# Patient Record
Sex: Female | Born: 1935 | Race: White | Hispanic: No | Smoking: Former smoker
Health system: Southern US, Community
[De-identification: ages and names within clinical notes are randomized; demographics above are authoritative.]

## PROBLEM LIST (undated history)

## (undated) DIAGNOSIS — T8859XA Other complications of anesthesia, initial encounter: Secondary | ICD-10-CM

## (undated) DIAGNOSIS — R5382 Chronic fatigue, unspecified: Secondary | ICD-10-CM

## (undated) DIAGNOSIS — A938 Other specified arthropod-borne viral fevers: Secondary | ICD-10-CM

## (undated) DIAGNOSIS — Z8489 Family history of other specified conditions: Secondary | ICD-10-CM

## (undated) DIAGNOSIS — Z9889 Other specified postprocedural states: Secondary | ICD-10-CM

## (undated) DIAGNOSIS — F329 Major depressive disorder, single episode, unspecified: Secondary | ICD-10-CM

## (undated) DIAGNOSIS — R002 Palpitations: Secondary | ICD-10-CM

## (undated) DIAGNOSIS — F1020 Alcohol dependence, uncomplicated: Secondary | ICD-10-CM

## (undated) DIAGNOSIS — F32A Depression, unspecified: Secondary | ICD-10-CM

## (undated) DIAGNOSIS — H8109 Meniere's disease, unspecified ear: Secondary | ICD-10-CM

## (undated) DIAGNOSIS — R112 Nausea with vomiting, unspecified: Secondary | ICD-10-CM

## (undated) DIAGNOSIS — M797 Fibromyalgia: Secondary | ICD-10-CM

## (undated) DIAGNOSIS — G9332 Myalgic encephalomyelitis/chronic fatigue syndrome: Secondary | ICD-10-CM

## (undated) DIAGNOSIS — R269 Unspecified abnormalities of gait and mobility: Secondary | ICD-10-CM

## (undated) DIAGNOSIS — I10 Essential (primary) hypertension: Secondary | ICD-10-CM

## (undated) DIAGNOSIS — F1920 Other psychoactive substance dependence, uncomplicated: Secondary | ICD-10-CM

## (undated) DIAGNOSIS — R42 Dizziness and giddiness: Principal | ICD-10-CM

## (undated) DIAGNOSIS — E785 Hyperlipidemia, unspecified: Secondary | ICD-10-CM

## (undated) DIAGNOSIS — F4310 Post-traumatic stress disorder, unspecified: Secondary | ICD-10-CM

## (undated) DIAGNOSIS — F419 Anxiety disorder, unspecified: Secondary | ICD-10-CM

## (undated) DIAGNOSIS — T4145XA Adverse effect of unspecified anesthetic, initial encounter: Secondary | ICD-10-CM

## (undated) DIAGNOSIS — H6690 Otitis media, unspecified, unspecified ear: Secondary | ICD-10-CM

## (undated) DIAGNOSIS — K219 Gastro-esophageal reflux disease without esophagitis: Secondary | ICD-10-CM

## (undated) DIAGNOSIS — R569 Unspecified convulsions: Secondary | ICD-10-CM

## (undated) DIAGNOSIS — C50919 Malignant neoplasm of unspecified site of unspecified female breast: Secondary | ICD-10-CM

## (undated) HISTORY — PX: TONSILLECTOMY: SUR1361

## (undated) HISTORY — DX: Hyperlipidemia, unspecified: E78.5

## (undated) HISTORY — PX: DILATION AND CURETTAGE OF UTERUS: SHX78

## (undated) HISTORY — DX: Post-traumatic stress disorder, unspecified: F43.10

## (undated) HISTORY — DX: Dizziness and giddiness: R42

## (undated) HISTORY — DX: Anxiety disorder, unspecified: F41.9

## (undated) HISTORY — DX: Unspecified abnormalities of gait and mobility: R26.9

---

## 1969-10-20 DIAGNOSIS — F1920 Other psychoactive substance dependence, uncomplicated: Secondary | ICD-10-CM

## 1969-10-20 HISTORY — DX: Other psychoactive substance dependence, uncomplicated: F19.20

## 2000-12-28 ENCOUNTER — Encounter: Admission: RE | Admit: 2000-12-28 | Discharge: 2000-12-28 | Payer: Self-pay | Admitting: Cardiovascular Disease

## 2000-12-28 ENCOUNTER — Encounter: Payer: Self-pay | Admitting: Cardiovascular Disease

## 2002-03-06 ENCOUNTER — Emergency Department (HOSPITAL_COMMUNITY): Admission: EM | Admit: 2002-03-06 | Discharge: 2002-03-06 | Payer: Self-pay

## 2002-03-22 ENCOUNTER — Emergency Department (HOSPITAL_COMMUNITY): Admission: EM | Admit: 2002-03-22 | Discharge: 2002-03-22 | Payer: Self-pay | Admitting: Emergency Medicine

## 2002-03-22 ENCOUNTER — Encounter: Payer: Self-pay | Admitting: Emergency Medicine

## 2003-05-19 ENCOUNTER — Emergency Department (HOSPITAL_COMMUNITY): Admission: EM | Admit: 2003-05-19 | Discharge: 2003-05-19 | Payer: Self-pay | Admitting: Emergency Medicine

## 2003-10-16 ENCOUNTER — Encounter: Admission: RE | Admit: 2003-10-16 | Discharge: 2003-10-16 | Payer: Self-pay | Admitting: Cardiovascular Disease

## 2004-07-15 ENCOUNTER — Encounter: Admission: RE | Admit: 2004-07-15 | Discharge: 2004-07-15 | Payer: Self-pay | Admitting: Cardiovascular Disease

## 2004-10-08 ENCOUNTER — Ambulatory Visit (HOSPITAL_COMMUNITY): Admission: RE | Admit: 2004-10-08 | Discharge: 2004-10-08 | Payer: Self-pay | Admitting: Cardiovascular Disease

## 2005-03-03 ENCOUNTER — Emergency Department (HOSPITAL_COMMUNITY): Admission: EM | Admit: 2005-03-03 | Discharge: 2005-03-03 | Payer: Self-pay | Admitting: Family Medicine

## 2006-06-23 ENCOUNTER — Emergency Department (HOSPITAL_COMMUNITY): Admission: EM | Admit: 2006-06-23 | Discharge: 2006-06-23 | Payer: Self-pay | Admitting: Family Medicine

## 2007-06-22 ENCOUNTER — Encounter: Admission: RE | Admit: 2007-06-22 | Discharge: 2007-06-22 | Payer: Self-pay | Admitting: Cardiovascular Disease

## 2007-07-20 ENCOUNTER — Emergency Department (HOSPITAL_COMMUNITY): Admission: EM | Admit: 2007-07-20 | Discharge: 2007-07-20 | Payer: Self-pay | Admitting: Emergency Medicine

## 2010-03-27 ENCOUNTER — Encounter: Admission: RE | Admit: 2010-03-27 | Discharge: 2010-03-27 | Payer: Self-pay | Admitting: Orthopedic Surgery

## 2010-11-09 ENCOUNTER — Encounter: Payer: Self-pay | Admitting: Cardiovascular Disease

## 2012-10-10 ENCOUNTER — Encounter (HOSPITAL_COMMUNITY): Payer: Self-pay | Admitting: Emergency Medicine

## 2012-10-10 ENCOUNTER — Emergency Department (HOSPITAL_COMMUNITY)
Admission: EM | Admit: 2012-10-10 | Discharge: 2012-10-10 | Disposition: A | Discharge status: Home / Self Care | Payer: Medicare Other | Attending: Emergency Medicine | Admitting: Emergency Medicine

## 2012-10-10 DIAGNOSIS — Z79899 Other long term (current) drug therapy: Secondary | ICD-10-CM | POA: Insufficient documentation

## 2012-10-10 DIAGNOSIS — K219 Gastro-esophageal reflux disease without esophagitis: Secondary | ICD-10-CM | POA: Insufficient documentation

## 2012-10-10 DIAGNOSIS — R42 Dizziness and giddiness: Secondary | ICD-10-CM

## 2012-10-10 DIAGNOSIS — R110 Nausea: Secondary | ICD-10-CM | POA: Insufficient documentation

## 2012-10-10 DIAGNOSIS — I10 Essential (primary) hypertension: Secondary | ICD-10-CM | POA: Insufficient documentation

## 2012-10-10 DIAGNOSIS — Z8669 Personal history of other diseases of the nervous system and sense organs: Secondary | ICD-10-CM | POA: Insufficient documentation

## 2012-10-10 DIAGNOSIS — Z87891 Personal history of nicotine dependence: Secondary | ICD-10-CM | POA: Insufficient documentation

## 2012-10-10 HISTORY — DX: Essential (primary) hypertension: I10

## 2012-10-10 HISTORY — DX: Gastro-esophageal reflux disease without esophagitis: K21.9

## 2012-10-10 HISTORY — DX: Otitis media, unspecified, unspecified ear: H66.90

## 2012-10-10 MED ORDER — LORAZEPAM 1 MG PO TABS
1.0000 mg | ORAL_TABLET | Freq: Once | ORAL | Status: AC
  Administered 2012-10-10: 1 mg via ORAL
  Filled 2012-10-10: qty 1

## 2012-10-10 MED ORDER — MECLIZINE HCL 25 MG PO TABS
25.0000 mg | ORAL_TABLET | Freq: Once | ORAL | Status: AC
  Administered 2012-10-10: 25 mg via ORAL
  Filled 2012-10-10: qty 1

## 2012-10-10 MED ORDER — MECLIZINE HCL 50 MG PO TABS: 25.0000 mg | ORAL_TABLET | Freq: Four times a day (QID) | ORAL | Status: DC | PRN

## 2012-10-10 NOTE — ED Notes (Signed)
[facility] called for transport.  

## 2012-10-10 NOTE — ED Notes (Addendum)
Pt here via EMS for c/o dizziness ( Vertigo) EMS stated she was up walking  around when they arrivedThe patient stated that she has had an inner ear  infection.She call her PCP and they told her to come here

## 2012-10-10 NOTE — ED Notes (Signed)
XBM:WU13<KG> Expected date:10/10/12<BR> Expected time: 9:18 AM<BR> Means of arrival:<BR> Comments:<BR> Vertigo

## 2012-10-10 NOTE — ED Provider Notes (Signed)
History    77 year old female with vertigo. Patient describes a sensation that things are moving around her. Worse when she walks or moves. Feels better at rest. Nauseated. No ear pain, sensation of ear fullness or tenderness. No numbness, tingling or loss of strength. No headaches. No visual complaints. Denies trauma.   CSN: [id number]  Arrival date & time 10/10/12  4098   First MD Initiated Contact with Patient 10/10/12 0940      Chief Complaint  Patient presents with  . Dizziness    (Consider location/radiation/quality/duration/timing/severity/associated sxs/prior treatment) HPI  Past Medical History  Diagnosis Date  . Hypertension   . Middle ear infection   . GERD (gastroesophageal reflux disease)     No past surgical history on file.  No family history on file.  History  Substance Use Topics  . Smoking status: Former Smoker    Quit date: 06/10/1974  . Smokeless tobacco: Not on file  . Alcohol Use: No    OB History    Grav Para Term Preterm Abortions TAB SAB Ect Mult Living                  Review of Systems  All systems reviewed and negative, other than as noted in HPI.   Allergies  Review of patient's allergies indicates no known allergies.  Home Medications   Current Outpatient Rx  Name  Route  Sig  Dispense  Refill  . AMLODIPINE BESYLATE 5 MG PO TABS   Oral   Take 2.5 mg by mouth daily.         Marland Kitchen ESOMEPRAZOLE MAGNESIUM 40 MG PO CPDR   Oral   Take 40 mg by mouth daily as needed. For indigestion.         Marland Kitchen METOPROLOL SUCCINATE ER 25 MG PO TB24   Oral   Take 12.5 mg by mouth daily.         . OXYCODONE HCL 5 MG PO TABS   Oral   Take 5 mg by mouth daily as needed. For pain/nerves.         Marland Kitchen TEMAZEPAM 15 MG PO CAPS   Oral   Take 7.5 mg by mouth at bedtime as needed. For sleep.         Marland Kitchen MECLIZINE HCL 50 MG PO TABS   Oral   Take 0.5 tablets (25 mg total) by mouth every 6 (six) hours as needed.   30 tablet   0     BP  138/77  Pulse 87  Temp 98.9 F (37.2 C)  Resp 18  Ht 5\' 1"  (1.549 m)  Wt 119 lb (53.978 kg)  BMI 22.48 kg/m2  SpO2 98%  Physical Exam  Nursing note and vitals reviewed. Constitutional: She is oriented to person, place, and time. She appears well-developed and well-nourished. No distress.  HENT:  Head: Normocephalic and atraumatic.  Right Ear: External ear normal.  Left Ear: External ear normal.       Tympanic membranes and external auditory canals are clear bilaterally.  Eyes: Conjunctivae normal are normal. Pupils are equal, round, and reactive to light. Right eye exhibits no discharge. Left eye exhibits no discharge.  Neck: Neck supple.  Cardiovascular: Normal rate, regular rhythm and normal heart sounds.  Exam reveals no gallop and no friction rub.   No murmur heard. Pulmonary/Chest: Effort normal and breath sounds normal. No respiratory distress.  Abdominal: Soft. She exhibits no distension. There is no tenderness.  Musculoskeletal: She exhibits no edema and no  tenderness.  Neurological: She is alert and oriented to person, place, and time. No cranial nerve deficit. She exhibits normal muscle tone. Coordination normal.       Good finger to nose testing bilaterally. Gait is steady.  Skin: Skin is warm and dry. She is not diaphoretic.  Psychiatric: She has a normal mood and affect. Her behavior is normal. Thought content normal.    ED Course  Procedures (including critical care time)  Labs Reviewed - No data to display No results found.   1. Vertigo       MDM  77 year old female with vertigo. Patient does not describe dizziness, lightheadedness or presyncopal symptoms. She has a nonfocal neurological examination. I suspect a peripheral cause of her symptoms. Consider central cause, but doubt. Plan symptomatic treatment at this time. Emergent return precautions were discussed. Outpatient PCP and/or ENT followup otherwise.        Raeford Razor, MD 10/10/12 1037

## 2012-10-10 NOTE — ED Notes (Signed)
Pt ambulated in hall with no assistance. Pt c/o dizziness.

## 2012-10-30 ENCOUNTER — Encounter (HOSPITAL_COMMUNITY): Payer: Self-pay | Admitting: *Deleted

## 2012-10-30 ENCOUNTER — Emergency Department (INDEPENDENT_AMBULATORY_CARE_PROVIDER_SITE_OTHER)
Admission: EM | Admit: 2012-10-30 | Discharge: 2012-10-30 | Disposition: A | Discharge status: Home / Self Care | Payer: Medicare Other | Source: Home / Self Care | Attending: Family Medicine | Admitting: Family Medicine

## 2012-10-30 DIAGNOSIS — L720 Epidermal cyst: Secondary | ICD-10-CM

## 2012-10-30 DIAGNOSIS — L723 Sebaceous cyst: Secondary | ICD-10-CM

## 2012-10-30 HISTORY — DX: Myalgic encephalomyelitis/chronic fatigue syndrome: G93.32

## 2012-10-30 HISTORY — DX: Chronic fatigue, unspecified: R53.82

## 2012-10-30 HISTORY — DX: Fibromyalgia: M79.7

## 2012-10-30 MED ORDER — MUPIROCIN CALCIUM 2 % EX CREA: TOPICAL_CREAM | Freq: Three times a day (TID) | CUTANEOUS | Status: DC

## 2012-10-30 MED ORDER — CEPHALEXIN 500 MG PO CAPS: 500.0000 mg | ORAL_CAPSULE | Freq: Two times a day (BID) | ORAL | Status: DC

## 2012-10-30 NOTE — ED Notes (Signed)
Pt reports skin abscess on chest ( dime size with redness) denies pain, tenderness - pt very excitable & dramatic at triage

## 2012-10-30 NOTE — ED Provider Notes (Signed)
History     CSN: [id number]  Arrival date & time 10/30/12  1710   First MD Initiated Contact with Patient 10/30/12 1713      Chief Complaint  Patient presents with  . Abscess    (Consider location/radiation/quality/duration/timing/severity/associated sxs/prior treatment) HPI Comments: 78 year old female with history of hypertension. Here complaining of a "red knot" in her left upper chest. Patient stated she noticed this "knot" a few days ago and has been touching it frequently and has become mildly tender. Denies spontaneous drainage. She is worried about an infection. Is otherwise feeling well.   Past Medical History  Diagnosis Date  . Hypertension   . Middle ear infection   . GERD (gastroesophageal reflux disease)   . Chronic fatigue fibromyalgia syndrome     History reviewed. No pertinent past surgical history.  Family History  Problem Relation Age of Onset  . Family history unknown: Yes    History  Substance Use Topics  . Smoking status: Former Smoker    Quit date: 06/10/1974  . Smokeless tobacco: Not on file  . Alcohol Use: No    OB History    Grav Para Term Preterm Abortions TAB SAB Ect Mult Living                  Review of Systems  Constitutional: Negative for fever and chills.  Skin:       As per HPI  Psychiatric/Behavioral: The patient is nervous/anxious.     Allergies  Review of patient's allergies indicates no known allergies.  Home Medications   Current Outpatient Rx  Name  Route  Sig  Dispense  Refill  . AMLODIPINE BESYLATE 5 MG PO TABS   Oral   Take 2.5 mg by mouth daily.         . CEPHALEXIN 500 MG PO CAPS   Oral   Take 1 capsule (500 mg total) by mouth 2 (two) times daily.   10 capsule   0   . ESOMEPRAZOLE MAGNESIUM 40 MG PO CPDR   Oral   Take 40 mg by mouth daily as needed. For indigestion.         Marland Kitchen MECLIZINE HCL 50 MG PO TABS   Oral   Take 0.5 tablets (25 mg total) by mouth every 6 (six) hours as needed.   30  tablet   0   . METOPROLOL SUCCINATE ER 25 MG PO TB24   Oral   Take 12.5 mg by mouth daily.         Marland Kitchen MUPIROCIN CALCIUM 2 % EX CREA   Topical   Apply topically 3 (three) times daily.   15 g   0   . OXYCODONE HCL 5 MG PO TABS   Oral   Take 5 mg by mouth daily as needed. For pain/nerves.         Marland Kitchen TEMAZEPAM 15 MG PO CAPS   Oral   Take 7.5 mg by mouth at bedtime as needed. For sleep.           BP 176/75  Pulse 96  Temp 98.7 F (37.1 C) (Oral)  Resp 18  SpO2 98%  Physical Exam  Nursing note and vitals reviewed. Constitutional: She is oriented to person, place, and time. She appears well-developed and well-nourished. No distress.  Cardiovascular: Normal heart sounds.   Pulmonary/Chest: Breath sounds normal.  Neurological: She is alert and oriented to person, place, and time.  Skin:       Small 1.5 cm  epidermal cyst with mild erythema on top.located in left upper chest. No fluctuations. No swelling or spontaneous drainage.    ED Course  INCISION AND DRAINAGE Performed by: Sharin Grave Authorized by: Sharin Grave Consent: Verbal consent obtained. Risks and benefits: risks, benefits and alternatives were discussed Consent given by: patient Patient understanding: patient states understanding of the procedure being performed Patient consent: the patient's understanding of the procedure matches consent given Type: cyst Body area: trunk Location details: chest Anesthesia: local infiltration Local anesthetic: lidocaine 1% without epinephrine Anesthetic total: 1 ml Needle gauge: 18 Complexity: simple Drainage: serous Drainage amount: scant Patient tolerance: Patient tolerated the procedure well with no immediate complications. Comments: Patient tolerated procedure well. No purulent drainage. No samples were sent for culture. A sterile dressing applied on top.   (including critical care time)  Labs Reviewed - No data to display No results  found.   1. Epidermal cyst       MDM  Irritated epidermal cyst. As per patient request I I&D it today. Only small amount of sebaceous material expressed. Wound care instructions discussed with patient and provided in writing. Prescribe Keflex and mupirocin. Supportive care and red flags that should prompt his return to medical attention discussed with patient and provided in writing.  Sharin Grave, MD 11/01/12 [phone]

## 2013-10-27 ENCOUNTER — Emergency Department (HOSPITAL_COMMUNITY)
Admission: EM | Admit: 2013-10-27 | Discharge: 2013-10-28 | Disposition: A | Discharge status: Home / Self Care | Payer: Medicare Other | Attending: Emergency Medicine | Admitting: Emergency Medicine

## 2013-10-27 ENCOUNTER — Encounter (HOSPITAL_COMMUNITY): Payer: Self-pay | Admitting: Emergency Medicine

## 2013-10-27 DIAGNOSIS — N950 Postmenopausal bleeding: Secondary | ICD-10-CM

## 2013-10-27 DIAGNOSIS — Z79899 Other long term (current) drug therapy: Secondary | ICD-10-CM | POA: Insufficient documentation

## 2013-10-27 DIAGNOSIS — Z792 Long term (current) use of antibiotics: Secondary | ICD-10-CM | POA: Insufficient documentation

## 2013-10-27 DIAGNOSIS — F1021 Alcohol dependence, in remission: Secondary | ICD-10-CM | POA: Insufficient documentation

## 2013-10-27 DIAGNOSIS — K219 Gastro-esophageal reflux disease without esophagitis: Secondary | ICD-10-CM | POA: Insufficient documentation

## 2013-10-27 DIAGNOSIS — N952 Postmenopausal atrophic vaginitis: Secondary | ICD-10-CM | POA: Insufficient documentation

## 2013-10-27 DIAGNOSIS — I10 Essential (primary) hypertension: Secondary | ICD-10-CM | POA: Insufficient documentation

## 2013-10-27 DIAGNOSIS — Z87891 Personal history of nicotine dependence: Secondary | ICD-10-CM | POA: Insufficient documentation

## 2013-10-27 DIAGNOSIS — Z8669 Personal history of other diseases of the nervous system and sense organs: Secondary | ICD-10-CM | POA: Insufficient documentation

## 2013-10-27 HISTORY — DX: Alcohol dependence, uncomplicated: F10.20

## 2013-10-27 NOTE — ED Provider Notes (Signed)
CSN: [id number]     Arrival date & time 10/27/13  2129 History   First MD Initiated Contact with Patient 10/27/13 2332     Chief Complaint  Patient presents with  . Vaginal Bleeding   (Consider location/radiation/quality/duration/timing/severity/associated sxs/prior Treatment) Patient is a 79 y.o. female presenting with vaginal bleeding. The history is provided by the patient.  Vaginal Bleeding She had intercourse this evening and following that, noted some pinkish drainage from the vagina. There is no pain. Over the next hour, she had 2 episodes of moderate bleeding from the vagina. She states that she has noted some pinkish drainage on one or 2 other occasions following intercourse. Intercourse only occurs once every one to 2 months but this month she has had a dull little bit more frequently. She denies any vaginal pain or abdominal pain.  Past Medical History  Diagnosis Date  . Hypertension   . Middle ear infection   . GERD (gastroesophageal reflux disease)   . Chronic fatigue fibromyalgia syndrome   . Alcoholism    Past Surgical History  Procedure Laterality Date  . Tonsillectomy    . Dilatation and curettage     History reviewed. No pertinent family history. History  Substance Use Topics  . Smoking status: Former Smoker    Quit date: 06/10/1974  . Smokeless tobacco: Not on file  . Alcohol Use: No   OB History   Grav Para Term Preterm Abortions TAB SAB Ect Mult Living                 Review of Systems  Genitourinary: Positive for vaginal bleeding.  All other systems reviewed and are negative.    Allergies  Review of patient's allergies indicates no known allergies.  Home Medications   Current Outpatient Rx  Name  Route  Sig  Dispense  Refill  . amLODipine (NORVASC) 5 MG tablet   Oral   Take 2.5 mg by mouth daily.         . cephALEXin (KEFLEX) 500 MG capsule   Oral   Take 1 capsule (500 mg total) by mouth 2 (two) times daily.   10 capsule   0   .  esomeprazole (NEXIUM) 40 MG capsule   Oral   Take 40 mg by mouth daily as needed. For indigestion.         . meclizine (ANTIVERT) 50 MG tablet   Oral   Take 0.5 tablets (25 mg total) by mouth every 6 (six) hours as needed.   30 tablet   0   . metoprolol succinate (TOPROL-XL) 25 MG 24 hr tablet   Oral   Take 12.5 mg by mouth daily.         . mupirocin cream (BACTROBAN) 2 %   Topical   Apply topically 3 (three) times daily.   15 g   0   . oxyCODONE (OXY IR/ROXICODONE) 5 MG immediate release tablet   Oral   Take 5 mg by mouth daily as needed. For pain/nerves.         . temazepam (RESTORIL) 15 MG capsule   Oral   Take 7.5 mg by mouth at bedtime as needed. For sleep.          BP 182/99  Pulse 97  Temp(Src) 97.8 F (36.6 C) (Oral)  Resp 20  SpO2 98% Physical Exam  Nursing note and vitals reviewed.  79 year old female, resting comfortably and in no acute distress. Vital signs are significant for hypertension with  blood pressure 100/80 to 99. Oxygen saturation is 98%, which is normal. Head is normocephalic and atraumatic. PERRLA, EOMI. Oropharynx is clear. Neck is nontender and supple without adenopathy or JVD. Back is nontender and there is no CVA tenderness. Lungs are clear without rales, wheezes, or rhonchi. Chest is nontender. Heart has regular rate and rhythm without murmur. Abdomen is soft, flat, nontender without masses or hepatosplenomegaly and peristalsis is normoactive. Pelvic: External genitalia and demonstrates moderate atrophy with some petechiae present. Speculum exam shows other areas with some petechiae which appear to be due to trauma. There is no mucosal tear and no pooling of blood in the vagina. Cervix is closed. There is a small amount of discharge present and specimens of been sent for wet prep. On bimanual exam, there is mild tenderness but that no adnexal masses and no cervical motion tenderness. Fundus is normal size and position. Extremities  have no cyanosis or edema, full range of motion is present. Skin is warm and dry without rash. Neurologic: Mental status is normal, cranial nerves are intact, there are no motor or sensory deficits. She is very anxious.  ED Course  Procedures (including critical care time) Labs Review Results for orders placed during the hospital encounter of 10/27/13  WET PREP, GENITAL      Result Value Range   Yeast Wet Prep HPF POC NONE SEEN  NONE SEEN   Trich, Wet Prep NONE SEEN  NONE SEEN   Clue Cells Wet Prep HPF POC RARE (*) NONE SEEN   WBC, Wet Prep HPF POC MODERATE (*) NONE SEEN   MDM   1. Postmenopausal vaginal bleeding   2. Postmenopausal atrophic vaginitis    Postcoital vaginal bleeding secondary to mucosal atrophy. She will be given a trial of estrogen vaginal cream.    Delora Fuel, MD 65/03/54 [id number]

## 2013-10-27 NOTE — ED Notes (Signed)
Pt. reports vaginal bleeding onset this evening after sexual intercourse , denies pain , respirations unlabored /ambulatory.

## 2013-10-28 ENCOUNTER — Telehealth (HOSPITAL_COMMUNITY): Payer: Self-pay

## 2013-10-28 LAB — HIV ANTIBODY (ROUTINE TESTING W REFLEX): HIV: NONREACTIVE

## 2013-10-28 LAB — RPR: RPR: NONREACTIVE

## 2013-10-28 LAB — WET PREP, GENITAL
Trich, Wet Prep: NONE SEEN
Yeast Wet Prep HPF POC: NONE SEEN

## 2013-10-28 MED ORDER — ESTROGENS, CONJUGATED 0.625 MG/GM VA CREA: 1.0000 | TOPICAL_CREAM | Freq: Every day | VAGINAL | Status: DC

## 2013-10-28 NOTE — Discharge Instructions (Signed)
USe a lubricant, like K-Y Jelly when you have sex.  Conjugated Estrogens vaginal cream What is this medicine? CONJUGATED ESTROGENS (CON ju gate ed ESS troe jenz) are a mixture of female hormones. This cream can help relieve symptoms associated with menopause.like vaginal dryness and irritation. This medicine may be used for other purposes; ask your health care provider or pharmacist if you have questions. COMMON BRAND NAME(S): Premarin What should I tell my health care provider before I take this medicine? They need to know if you have any of these conditions: -abnormal vaginal bleeding -blood vessel disease or blood clots -breast, cervical, endometrial, or uterine cancer -dementia -diabetes -gallbladder disease -heart disease or recent heart attack -high blood pressure -high cholesterol -high level of calcium in the blood -hysterectomy -kidney disease -liver disease -migraine headaches -protein C deficiency -protein S deficiency -stroke -systemic lupus erythematosus (SLE) -tobacco smoker -an unusual or allergic reaction to estrogens other medicines, foods, dyes, or preservatives -pregnant or trying to get pregnant -breast-feeding How should I use this medicine? This medicine is for use in the vagina only. Do not take by mouth. Follow the directions on the prescription label. Use at bedtime unless otherwise directed by your doctor or health care professional. Use the special applicator supplied with the cream. Wash hands before and after use. Fill the applicator with the cream and remove from the tube. Lie on your back, part and bend your knees. Insert the applicator into the vagina and push the plunger to expel the cream into the vagina. Wash the applicator with warm soapy water and rinse well. Use exactly as directed for the complete length of time prescribed. Do not stop using except on the advice of your doctor or health care professional. Talk to your pediatrician regarding the  use of this medicine in children. Special care may be needed. A patient package insert for the product will be given with each prescription and refill. Read this sheet carefully each time. The sheet may change frequently. Overdosage: If you think you have taken too much of this medicine contact a poison control center or emergency room at once. NOTE: This medicine is only for you. Do not share this medicine with others. What if I miss a dose? If you miss a dose, use it as soon as you can. If it is almost time for your next dose, use only that dose. Do not use double or extra doses. What may interact with this medicine? Do not take this medicine with any of the following medications: -aromatase inhibitors like aminoglutethimide, anastrozole, exemestane, letrozole, testolactone This medicine may also interact with the following medications: -barbiturates used for inducing sleep or treating seizures -carbamazepine -grapefruit juice -medicines for fungal infections like itraconazole and ketoconazole -raloxifene or tamoxifen -rifabutin -rifampin -rifapentine -ritonavir -some antibiotics used to treat infections -St. John's Wort -warfarin This list may not describe all possible interactions. Give your health care provider a list of all the medicines, herbs, non-prescription drugs, or dietary supplements you use. Also tell them if you smoke, drink alcohol, or use illegal drugs. Some items may interact with your medicine. What should I watch for while using this medicine? Visit your health care professional for regular checks on your progress. You will need a regular breast and pelvic exam. You should also discuss the need for regular mammograms with your health care professional, and follow his or her guidelines. This medicine can make your body retain fluid, making your fingers, hands, or ankles swell. Your blood pressure can  go up. Contact your doctor or health care professional if you feel you  are retaining fluid. If you have any reason to think you are pregnant; stop taking this medicine at once and contact your doctor or health care professional. Tobacco smoking increases the risk of getting a blood clot or having a stroke, especially if you are more than 77 years old. You are strongly advised not to smoke. If you wear contact lenses and notice visual changes, or if the lenses begin to feel uncomfortable, consult your eye care specialist. If you are going to have elective surgery, you may need to stop taking this medicine beforehand. Consult your health care professional for advice prior to scheduling the surgery. What side effects may I notice from receiving this medicine? Side effects that you should report to your doctor or health care professional as soon as possible: -allergic reactions like skin rash, itching or hives, swelling of the face, lips, or tongue -breast tissue changes or discharge -changes in vision -chest pain -confusion, trouble speaking or understanding -dark urine -general ill feeling or flu-like symptoms -light-colored stools -nausea, vomiting -pain, swelling, warmth in the leg -right upper belly pain -severe headaches -shortness of breath -sudden numbness or weakness of the face, arm or leg -trouble walking, dizziness, loss of balance or coordination -unusual vaginal bleeding -yellowing of the eyes or skin Side effects that usually do not require medical attention (report to your doctor or health care professional if they continue or are bothersome): -hair loss -increased hunger or thirst -increased urination -symptoms of vaginal infection like itching, irritation or unusual discharge -unusually weak or tired This list may not describe all possible side effects. Call your doctor for medical advice about side effects. You may report side effects to FDA at 1-800-FDA-1088. Where should I keep my medicine? Keep out of the reach of children. Store at  room temperature between 15 and 30 degrees C (59 and 86 degrees F). Throw away any unused medicine after the expiration date. NOTE: This sheet is a summary. It may not cover all possible information. If you have questions about this medicine, talk to your doctor, pharmacist, or health care provider.  2014, [employer]. (2011-01-08 09:20:36)

## 2013-10-28 NOTE — ED Notes (Signed)
Pt reports being sexually active for the past year, prior to that pt reports last sexual intercourse was 20 years ago.  Pt admits to some pink vaginal spotting occasional after intercourse, today she noticed 2 episodes of blood "gushing".  Denies any pain at present but admits to pain with intercourse.

## 2013-10-29 LAB — GC/CHLAMYDIA PROBE AMP
CT Probe RNA: NEGATIVE
GC Probe RNA: NEGATIVE

## 2014-05-10 ENCOUNTER — Encounter (HOSPITAL_COMMUNITY): Payer: Self-pay | Admitting: Emergency Medicine

## 2014-05-10 ENCOUNTER — Emergency Department (INDEPENDENT_AMBULATORY_CARE_PROVIDER_SITE_OTHER): Payer: Medicare Other

## 2014-05-10 ENCOUNTER — Emergency Department (INDEPENDENT_AMBULATORY_CARE_PROVIDER_SITE_OTHER)
Admission: EM | Admit: 2014-05-10 | Discharge: 2014-05-10 | Disposition: A | Discharge status: Home / Self Care | Payer: Medicare Other | Source: Home / Self Care | Attending: Family Medicine | Admitting: Family Medicine

## 2014-05-10 ENCOUNTER — Emergency Department (HOSPITAL_COMMUNITY): Payer: Medicare Other

## 2014-05-10 DIAGNOSIS — R509 Fever, unspecified: Secondary | ICD-10-CM

## 2014-05-10 HISTORY — DX: Meniere's disease, unspecified ear: H81.09

## 2014-05-10 LAB — POCT URINALYSIS DIP (DEVICE)
BILIRUBIN URINE: NEGATIVE
Glucose, UA: NEGATIVE mg/dL
KETONES UR: NEGATIVE mg/dL
Nitrite: NEGATIVE
PH: 5.5 (ref 5.0–8.0)
Protein, ur: NEGATIVE mg/dL
Specific Gravity, Urine: >=1.03 (ref 1.005–1.030)
Urobilinogen, UA: 0.2 mg/dL (ref 0.0–1.0)

## 2014-05-10 MED ORDER — DOXYCYCLINE HYCLATE 100 MG PO CAPS: 100.0000 mg | ORAL_CAPSULE | Freq: Two times a day (BID) | ORAL | Status: DC

## 2014-05-10 MED ORDER — ACETAMINOPHEN 325 MG PO TABS
650.0000 mg | ORAL_TABLET | Freq: Once | ORAL | Status: AC
  Administered 2014-05-10: 650 mg via ORAL

## 2014-05-10 MED ORDER — ACETAMINOPHEN 325 MG PO TABS
ORAL_TABLET | ORAL | Status: AC
  Filled 2014-05-10: qty 2

## 2014-05-10 NOTE — ED Notes (Signed)
C/o fatigue onset Sat. ( hx. Chronic fatigue syndrome) but worse than usual.  C/o pinching in abdomen, back ears and toes.  Pains are gone now.  C/o dizziness.  C/o fever noted today.  Had tick bite to R wrist on Sunday 7/12. Now site is red and swollen.  She saw Dr. Janalyn Rouse on 7/15 and had a stress test. No change. No other rashes.

## 2014-05-10 NOTE — ED Provider Notes (Signed)
CSN: [id number]     Arrival date & time 05/10/14  1636 History   First MD Initiated Contact with Patient 05/10/14 1722     Chief Complaint  Patient presents with  . Fatigue  . Fever   (Consider location/radiation/quality/duration/timing/severity/associated sxs/prior Treatment) Patient is a 79 y.o. female presenting with fever. The history is provided by the patient.  Fever Severity:  Moderate Onset quality:  Sudden Duration:  6 hours Progression:  Unchanged Chronicity:  New Associated symptoms: myalgias   Associated symptoms: no cough, no diarrhea, no dysuria, no headaches, no nausea, no rash and no vomiting   Risk factors comment:  H/o tick bite right wrist 10 d ago.   Past Medical History  Diagnosis Date  . Hypertension   . Middle ear infection   . GERD (gastroesophageal reflux disease)   . Chronic fatigue fibromyalgia syndrome   . Alcoholism   . Meniere disease    Past Surgical History  Procedure Laterality Date  . Tonsillectomy    . Dilatation and curettage     Family History  Problem Relation Age of Onset  . Stroke Father    History  Substance Use Topics  . Smoking status: Former Smoker    Quit date: 06/10/1974  . Smokeless tobacco: Not on file  . Alcohol Use: No     Comment: recovered  alcoholic -none since 05/20/00   OB History   Grav Para Term Preterm Abortions TAB SAB Ect Mult Living                 Review of Systems  Constitutional: Positive for fever.  HENT: Negative.   Respiratory: Negative for cough.   Gastrointestinal: Negative for nausea, vomiting and diarrhea.  Genitourinary: Negative for dysuria.  Musculoskeletal: Positive for myalgias.  Skin: Negative for rash.  Neurological: Negative for headaches.    Allergies  Cleocin  Home Medications   Prior to Admission medications   Medication Sig Start Date End Date Taking? Authorizing Provider  acetaminophen (TYLENOL) 325 MG tablet Take 650 mg by mouth every 6 (six) hours as needed for  mild pain or moderate pain.    Historical Provider, MD  amLODipine (NORVASC) 5 MG tablet Take 2.5 mg by mouth daily.    Historical Provider, MD  conjugated estrogens (PREMARIN) vaginal cream Place 1 Applicatorful vaginally daily. 04/23/09   Delora Fuel, MD  Cyanocobalamin (VITAMIN B 12) 100 MCG LOZG Take 1 tablet by mouth daily.    Historical Provider, MD  doxycycline (VIBRAMYCIN) 100 MG capsule Take 1 capsule (100 mg total) by mouth 2 (two) times daily. 05/10/14   Billy Fischer, MD  doxycycline (VIBRAMYCIN) 100 MG capsule Take 1 capsule (100 mg total) by mouth 2 (two) times daily. 05/10/14   Billy Fischer, MD  esomeprazole (NEXIUM) 40 MG capsule Take 40 mg by mouth daily as needed. For indigestion.    Historical Provider, MD  meclizine (ANTIVERT) 50 MG tablet Take 0.5 tablets (25 mg total) by mouth every 6 (six) hours as needed. 10/10/12   Virgel Manifold, MD  metoprolol succinate (TOPROL-XL) 25 MG 24 hr tablet Take 12.5 mg by mouth daily.    Historical Provider, MD  mupirocin cream (BACTROBAN) 2 % Apply topically 3 (three) times daily. 10/30/12   Adlih Moreno-Coll, MD  oxyCODONE (OXY IR/ROXICODONE) 5 MG immediate release tablet Take 5 mg by mouth daily as needed. For pain/nerves.    Historical Provider, MD  temazepam (RESTORIL) 15 MG capsule Take 7.5 mg by mouth at bedtime  as needed. For sleep.    Historical Provider, MD   BP 169/95  Pulse 98  Temp(Src) 99.8 F (37.7 C) (Oral)  Resp 18 Physical Exam  Nursing note and vitals reviewed. Constitutional: She is oriented to person, place, and time. She appears well-developed and well-nourished. No distress.  HENT:  Right Ear: External ear normal.  Left Ear: External ear normal.  Mouth/Throat: Oropharynx is clear and moist.  Eyes: Conjunctivae are normal. Pupils are equal, round, and reactive to light.  Neck: Normal range of motion. Neck supple.  Cardiovascular: Normal heart sounds.   Pulmonary/Chest: Effort normal and breath sounds normal.   Lymphadenopathy:    She has no cervical adenopathy.  Neurological: She is alert and oriented to person, place, and time.  Skin: Skin is warm and dry. No rash noted.  Tick bite right wrist.    ED Course  Procedures (including critical care time) Labs Review Labs Reviewed  POCT URINALYSIS DIP (DEVICE) - Abnormal; Notable for the following:    Hgb urine dipstick SMALL (*)    Leukocytes, UA SMALL (*)    All other components within normal limits  ROCKY MTN SPOTTED FVR AB, IGM-BLOOD    Imaging Review Dg Chest 2 View  05/10/2014   CLINICAL DATA:  Fever.  EXAM: CHEST  2 VIEW  COMPARISON:  None.  FINDINGS: Lungs clear. Heart size normal. No pneumothorax or pleural effusion. No focal bony abnormality.  IMPRESSION: Negative chest.   Electronically Signed   By: Inge Rise M.D.   On: 05/10/2014 18:58    X-rays reviewed and report per radiologist.  MDM   1. Fever of undetermined origin        Billy Fischer, MD 05/10/14 2140

## 2014-05-10 NOTE — Discharge Instructions (Signed)
Take all of medicine as prescribed and see your doctor on fri for recheck.

## 2014-05-10 NOTE — ED Notes (Signed)
Patient called w questions about the readiness of her rx at her chosen pharmacy. Discussed w Dr Juventino Slovak, who had sent her rx electronically prior to her release. Patient was informed the Rx should be ready shortly, but we can NEVER promise the Rx will be ready on her arrival from [facility] to pick up w/o delay

## 2014-05-11 LAB — ROCKY MTN SPOTTED FVR AB, IGM-BLOOD: RMSF IGM: 0.31 IV (ref 0.00–0.89)

## 2014-10-20 HISTORY — PX: BREAST BIOPSY: SHX20

## 2015-01-10 ENCOUNTER — Encounter (HOSPITAL_COMMUNITY): Payer: Self-pay

## 2015-01-10 ENCOUNTER — Other Ambulatory Visit: Payer: Self-pay | Admitting: Cardiovascular Disease

## 2015-01-10 ENCOUNTER — Emergency Department (INDEPENDENT_AMBULATORY_CARE_PROVIDER_SITE_OTHER)
Admission: EM | Admit: 2015-01-10 | Discharge: 2015-01-10 | Disposition: A | Discharge status: Home / Self Care | Payer: Medicare Other | Source: Home / Self Care | Attending: Family Medicine | Admitting: Family Medicine

## 2015-01-10 ENCOUNTER — Ambulatory Visit
Admission: RE | Admit: 2015-01-10 | Discharge: 2015-01-10 | Disposition: A | Discharge status: Home / Self Care | Payer: Medicare Other | Source: Ambulatory Visit | Attending: Cardiovascular Disease | Admitting: Cardiovascular Disease

## 2015-01-10 DIAGNOSIS — N63 Unspecified lump in breast: Secondary | ICD-10-CM

## 2015-01-10 DIAGNOSIS — N6310 Unspecified lump in the right breast, unspecified quadrant: Secondary | ICD-10-CM

## 2015-01-10 DIAGNOSIS — Z1231 Encounter for screening mammogram for malignant neoplasm of breast: Secondary | ICD-10-CM

## 2015-01-10 NOTE — ED Notes (Signed)
Wanting to get a mammogram since she noted a lump in her breast. C/o she unable to get in to see her PCP until sometime in April

## 2015-01-10 NOTE — ED Provider Notes (Signed)
CSN: [id number]     Arrival date & time 01/10/15  2951 History   First MD Initiated Contact with Patient 01/10/15 [phone]     Chief Complaint  Patient presents with  . Breast Problem   (Consider location/radiation/quality/duration/timing/severity/associated sxs/prior Treatment) HPI Comments: Patient present stating that she discovered a mass in her right breast last night during self exam. Reports that her PCP is out of town and she would like a mammogram today. States her last mammogram was 29 years ago.  The history is provided by the patient.    Past Medical History  Diagnosis Date  . Hypertension   . Middle ear infection   . GERD (gastroesophageal reflux disease)   . Chronic fatigue fibromyalgia syndrome   . Alcoholism   . Meniere disease    Past Surgical History  Procedure Laterality Date  . Tonsillectomy    . Dilatation and curettage     Family History  Problem Relation Age of Onset  . Stroke Father    History  Substance Use Topics  . Smoking status: Former Smoker    Quit date: 06/10/1974  . Smokeless tobacco: Not on file  . Alcohol Use: No     Comment: recovered  alcoholic -none since 03/25/05   OB History    No data available     Review of Systems  Constitutional: Negative for fever, appetite change and unexpected weight change.  HENT: Negative.   Respiratory: Negative.   Cardiovascular: Negative.   Gastrointestinal: Negative.   Skin: Negative for color change, pallor, rash and wound.       No nipple discharge    Allergies  Cleocin  Home Medications   Prior to Admission medications   Medication Sig Start Date End Date Taking? Authorizing Provider  amLODipine (NORVASC) 5 MG tablet Take 2.5 mg by mouth daily.   Yes Historical Provider, MD  metoprolol succinate (TOPROL-XL) 25 MG 24 hr tablet Take 12.5 mg by mouth daily.   Yes Historical Provider, MD  acetaminophen (TYLENOL) 325 MG tablet Take 650 mg by mouth every 6 (six) hours as needed for mild pain or  moderate pain.    Historical Provider, MD  conjugated estrogens (PREMARIN) vaginal cream Place 1 Applicatorful vaginally daily. 3/0/16   Delora Fuel, MD  Cyanocobalamin (VITAMIN B 12) 100 MCG LOZG Take 1 tablet by mouth daily.    Historical Provider, MD  doxycycline (VIBRAMYCIN) 100 MG capsule Take 1 capsule (100 mg total) by mouth 2 (two) times daily. 05/10/14   Billy Fischer, MD  doxycycline (VIBRAMYCIN) 100 MG capsule Take 1 capsule (100 mg total) by mouth 2 (two) times daily. 05/10/14   Billy Fischer, MD  esomeprazole (NEXIUM) 40 MG capsule Take 40 mg by mouth daily as needed. For indigestion.    Historical Provider, MD  meclizine (ANTIVERT) 50 MG tablet Take 0.5 tablets (25 mg total) by mouth every 6 (six) hours as needed. 10/10/12   Virgel Manifold, MD  mupirocin cream (BACTROBAN) 2 % Apply topically 3 (three) times daily. 10/30/12   Adlih Moreno-Coll, MD  oxyCODONE (OXY IR/ROXICODONE) 5 MG immediate release tablet Take 5 mg by mouth daily as needed. For pain/nerves.    Historical Provider, MD  temazepam (RESTORIL) 15 MG capsule Take 7.5 mg by mouth at bedtime as needed. For sleep.    Historical Provider, MD   BP 150/75 mmHg  Pulse 96  Temp(Src) 99.4 F (37.4 C) (Oral)  Resp 16  SpO2 98% Physical Exam  Constitutional: She is  oriented to person, place, and time. She appears well-developed and well-nourished.  Cardiovascular: Normal rate, regular rhythm and normal heart sounds.   Pulmonary/Chest: Effort normal and breath sounds normal. She exhibits mass and tenderness. Right breast exhibits mass and tenderness. Right breast exhibits no inverted nipple, no nipple discharge and no skin change.    No axillary lymphadenopathy. Left breast normal. No skin changes of nipple discharge  Musculoskeletal: Normal range of motion.  Neurological: She is alert and oriented to person, place, and time.  Skin: Skin is warm and dry.  Psychiatric: Her mood appears anxious.    ED Course  Procedures  (including critical care time) Labs Review Labs Reviewed - No data to display  Imaging Review No results found.   MDM   1. Breast mass, right   Contacted patient's PCP, Dr. Doylene Canard, who stated he would be glad for patient to come from [facility] directly to his office today and he will examine and arrange for mammogram study. Patient states she will go directly to PCP office from [facility].     Lutricia Feil, [place] 01/10/15 1011

## 2015-01-10 NOTE — Discharge Instructions (Signed)
I have contacted your PCP, Dr. Doylene Canard, and he would like you to walk to his office upon being discharge from our clinic and he will see you in his office today. His office will help arrange your mammogram.

## 2015-01-11 ENCOUNTER — Other Ambulatory Visit: Payer: Self-pay | Admitting: Cardiovascular Disease

## 2015-01-11 DIAGNOSIS — N6310 Unspecified lump in the right breast, unspecified quadrant: Secondary | ICD-10-CM

## 2015-01-12 ENCOUNTER — Ambulatory Visit
Admission: RE | Admit: 2015-01-12 | Discharge: 2015-01-12 | Disposition: A | Discharge status: Home / Self Care | Payer: Medicare Other | Source: Ambulatory Visit | Attending: Cardiovascular Disease | Admitting: Cardiovascular Disease

## 2015-01-12 DIAGNOSIS — N6310 Unspecified lump in the right breast, unspecified quadrant: Secondary | ICD-10-CM

## 2015-01-15 ENCOUNTER — Other Ambulatory Visit: Payer: Self-pay | Admitting: Cardiovascular Disease

## 2015-01-15 DIAGNOSIS — N63 Unspecified lump in unspecified breast: Secondary | ICD-10-CM

## 2015-01-16 ENCOUNTER — Other Ambulatory Visit: Payer: Self-pay | Admitting: Cardiovascular Disease

## 2015-01-16 ENCOUNTER — Ambulatory Visit
Admission: RE | Admit: 2015-01-16 | Discharge: 2015-01-16 | Disposition: A | Discharge status: Home / Self Care | Payer: Medicare Other | Source: Ambulatory Visit | Attending: Cardiovascular Disease | Admitting: Cardiovascular Disease

## 2015-01-16 DIAGNOSIS — N63 Unspecified lump in unspecified breast: Secondary | ICD-10-CM

## 2015-01-16 DIAGNOSIS — C50911 Malignant neoplasm of unspecified site of right female breast: Secondary | ICD-10-CM

## 2015-01-17 ENCOUNTER — Telehealth: Payer: Self-pay | Admitting: *Deleted

## 2015-01-17 ENCOUNTER — Other Ambulatory Visit: Payer: Self-pay | Admitting: *Deleted

## 2015-01-17 DIAGNOSIS — C50311 Malignant neoplasm of lower-inner quadrant of right female breast: Secondary | ICD-10-CM | POA: Insufficient documentation

## 2015-01-17 NOTE — Telephone Encounter (Signed)
Received call back from patient. Confirmed [facility] for 01/24/15 at 830am . She had actually left 4 messages in a row stating she wanted to be left alone and not to call her unless it was urgent and was quite frantic.   She states she is now worried that something else is wrong.   She eventually calmed down and informed me she thought I was calling to give her therapy about her diagnosis.  Instructions and contact information given.

## 2015-01-17 NOTE — Telephone Encounter (Signed)
Left message for a return phone call to schedule patient for [facility]. Awaiting patient response.

## 2015-01-18 ENCOUNTER — Ambulatory Visit
Admission: RE | Admit: 2015-01-18 | Discharge: 2015-01-18 | Disposition: A | Discharge status: Home / Self Care | Payer: Medicare Other | Source: Ambulatory Visit | Attending: Cardiovascular Disease | Admitting: Cardiovascular Disease

## 2015-01-18 DIAGNOSIS — C50911 Malignant neoplasm of unspecified site of right female breast: Secondary | ICD-10-CM

## 2015-01-18 MED ORDER — GADOBENATE DIMEGLUMINE 529 MG/ML IV SOLN
10.0000 mL | Freq: Once | INTRAVENOUS | Status: AC | PRN
  Administered 2015-01-18: 10 mL via INTRAVENOUS

## 2015-01-22 ENCOUNTER — Other Ambulatory Visit: Payer: Self-pay | Admitting: Cardiovascular Disease

## 2015-01-22 DIAGNOSIS — R928 Other abnormal and inconclusive findings on diagnostic imaging of breast: Secondary | ICD-10-CM

## 2015-01-24 ENCOUNTER — Ambulatory Visit (HOSPITAL_BASED_OUTPATIENT_CLINIC_OR_DEPARTMENT_OTHER): Payer: Medicare Other | Admitting: Hematology and Oncology

## 2015-01-24 ENCOUNTER — Encounter: Payer: Self-pay | Admitting: *Deleted

## 2015-01-24 ENCOUNTER — Encounter: Payer: Self-pay | Admitting: Hematology and Oncology

## 2015-01-24 ENCOUNTER — Ambulatory Visit
Admission: RE | Admit: 2015-01-24 | Discharge: 2015-01-24 | Disposition: A | Discharge status: Home / Self Care | Payer: Medicare Other | Source: Ambulatory Visit | Attending: Radiation Oncology | Admitting: Radiation Oncology

## 2015-01-24 ENCOUNTER — Telehealth: Payer: Self-pay | Admitting: Hematology and Oncology

## 2015-01-24 ENCOUNTER — Other Ambulatory Visit (HOSPITAL_BASED_OUTPATIENT_CLINIC_OR_DEPARTMENT_OTHER): Payer: Medicare Other

## 2015-01-24 ENCOUNTER — Ambulatory Visit: Payer: Medicare Other | Attending: General Surgery | Admitting: Physical Therapy

## 2015-01-24 ENCOUNTER — Ambulatory Visit: Payer: Medicare Other

## 2015-01-24 ENCOUNTER — Encounter: Payer: Self-pay | Admitting: Physical Therapy

## 2015-01-24 ENCOUNTER — Other Ambulatory Visit: Payer: Self-pay | Admitting: General Surgery

## 2015-01-24 ENCOUNTER — Encounter: Payer: Self-pay | Admitting: Skilled Nursing Facility1

## 2015-01-24 VITALS — BP 140/64 | HR 68 | Temp 97.8°F | Resp 18 | Ht 61.0 in | Wt 116.1 lb

## 2015-01-24 DIAGNOSIS — C50311 Malignant neoplasm of lower-inner quadrant of right female breast: Secondary | ICD-10-CM

## 2015-01-24 DIAGNOSIS — R293 Abnormal posture: Secondary | ICD-10-CM

## 2015-01-24 LAB — COMPREHENSIVE METABOLIC PANEL (CC13)
ALT: 18 U/L (ref 0–55)
AST: 21 U/L (ref 5–34)
Albumin: 4.2 g/dL (ref 3.5–5.0)
Alkaline Phosphatase: 71 U/L (ref 40–150)
Anion Gap: 11 mEq/L (ref 3–11)
BUN: 15.2 mg/dL (ref 7.0–26.0)
CHLORIDE: 106 meq/L (ref 98–109)
CO2: 24 mEq/L (ref 22–29)
Calcium: 9.5 mg/dL (ref 8.4–10.4)
Creatinine: 0.9 mg/dL (ref 0.6–1.1)
EGFR: 60 mL/min/{1.73_m2} — ABNORMAL LOW (ref >90)
Glucose: 110 mg/dl (ref 70–140)
POTASSIUM: 3.9 meq/L (ref 3.5–5.1)
Sodium: 142 mEq/L (ref 136–145)
TOTAL PROTEIN: 7.2 g/dL (ref 6.4–8.3)
Total Bilirubin: 0.35 mg/dL (ref 0.20–1.20)

## 2015-01-24 LAB — CBC WITH DIFFERENTIAL/PLATELET
BASO%: 0.7 % (ref 0.0–2.0)
Basophils Absolute: 0.1 10*3/uL (ref 0.0–0.1)
EOS%: 1.6 % (ref 0.0–7.0)
Eosinophils Absolute: 0.1 10*3/uL (ref 0.0–0.5)
HEMATOCRIT: 42 % (ref 34.8–46.6)
HGB: 13.8 g/dL (ref 11.6–15.9)
LYMPH%: 18.6 % (ref 14.0–49.7)
MCH: 29.2 pg (ref 25.1–34.0)
MCHC: 32.9 g/dL (ref 31.5–36.0)
MCV: 89 fL (ref 79.5–101.0)
MONO#: 0.5 10*3/uL (ref 0.1–0.9)
MONO%: 6.5 % (ref 0.0–14.0)
NEUT%: 72.6 % (ref 38.4–76.8)
NEUTROS ABS: 5.7 10*3/uL (ref 1.5–6.5)
PLATELETS: 221 10*3/uL (ref 145–400)
RBC: 4.72 10*6/uL (ref 3.70–5.45)
RDW: 14 % (ref 11.2–14.5)
WBC: 7.9 10*3/uL (ref 3.9–10.3)
lymph#: 1.5 10*3/uL (ref 0.9–3.3)

## 2015-01-24 NOTE — Patient Instructions (Signed)

## 2015-01-24 NOTE — Telephone Encounter (Signed)
Called and left a message with the chemo ed appointment

## 2015-01-24 NOTE — Progress Notes (Signed)
Checked in new pt with no financial concerns at this time.  Pt has 2 insurances so financial assistance may not be needed.  ° °

## 2015-01-24 NOTE — Therapy (Signed)
[place], [place], [address] Phone: [phone]   Fax:  [phone]  Physical Therapy Evaluation  Patient Details  Name: Grace Paul MRN: [id number] Date of Birth: 1935-04-11 Referring Provider:  [name], MD  Encounter Date: 01/24/2015      PT End of Session - 01/24/15 1307    Visit Number 1   Number of Visits 1   PT Start Time 1115   PT Stop Time 1144   PT Time Calculation (min) 29 min   Activity Tolerance Patient tolerated treatment well   Behavior During Therapy [facility] for tasks assessed/performed      Past Medical History  Diagnosis Date  . Hypertension   . Middle ear infection   . GERD (gastroesophageal reflux disease)   . Chronic fatigue fibromyalgia syndrome   . Alcoholism   . Meniere disease   . Post traumatic stress disorder   . Anxiety     Past Surgical History  Procedure Laterality Date  . Tonsillectomy    . Dilatation and curettage      There were no vitals filed for this visit.  Visit Diagnosis:  Abnormal posture - Plan: PT plan of care cert/re-cert  Carcinoma of lower-inner quadrant of right breast - Plan: PT plan of care cert/re-cert      Subjective Assessment - 01/24/15 1257    Subjective Patient was seen today for a baseline assessment of her newly diagnosed right (and possible left) breast cancer.   Pertinent History She was diagnosed on 01/16/15 with left triple negative breast cancer which is grade 3.  She also showed a suspicious area described as a complex sclerosing lesion on the left breast.  This will be further evaluated.   Patient Stated Goals Reduce lymphedema risk and learn post op shoulder ROM HEP   Currently in Pain? No/denies            [facility] PT Assessment - 01/24/15 0001    Assessment   Medical Diagnosis Right Triple negative breast cancer and possible left breast cancer   Onset Date 01/16/15   Precautions   Precautions Other (comment)   Active breast cancer   Restrictions   Weight Bearing Restrictions No   Balance Screen   Has the patient fallen in the past 6 months No   Has the patient had a decrease in activity level because of a fear of falling?  No   Is the patient reluctant to leave their home because of a fear of falling?  No   Home Environment   Living Enviornment Private residence   Living Arrangements Alone   Available Help at Discharge Other (Comment)  unknown; possibly a friend   Type of Home Apartment   Prior Function   Level of Independence Independent with basic ADLs   Vocation Retired   Leisure She jogs twice a week for about 30 minutes   Cognition   Overall Cognitive Status Within Functional Limits for tasks assessed   Posture/Postural Control   Posture/Postural Control Postural limitations   Postural Limitations Rounded Shoulders;Forward head   ROM / Strength   AROM / PROM / Strength AROM;Strength   AROM   AROM Assessment Site Shoulder   Right/Left Shoulder Right;Left   Right Shoulder Extension 68 Degrees   Right Shoulder Flexion 151 Degrees   Right Shoulder ABduction 148 Degrees   Right Shoulder Internal Rotation 74 Degrees   Right Shoulder External Rotation 88 Degrees   Left Shoulder Extension 60 Degrees  Left Shoulder Flexion 144 Degrees   Left Shoulder ABduction 148 Degrees   Left Shoulder Internal Rotation 69 Degrees   Left Shoulder External Rotation 90 Degrees   Strength   Overall Strength Within functional limits for tasks performed           LYMPHEDEMA/ONCOLOGY QUESTIONNAIRE - 01/24/15 1303    Type   Cancer Type Right triple negative breast cancer   Lymphedema Assessments   Lymphedema Assessments Upper extremities   Right Upper Extremity Lymphedema   10 cm Proximal to Olecranon Process 23.6 cm   Olecranon Process 20.9 cm   10 cm Proximal to Ulnar Styloid Process 19.7 cm   Just Proximal to Ulnar Styloid Process 14.2 cm   Across Hand at [employer] 17.3 cm   At  [place] of 2nd Digit 5.8 cm   Left Upper Extremity Lymphedema   10 cm Proximal to Olecranon Process 23.6 cm   Olecranon Process 21 cm   10 cm Proximal to Ulnar Styloid Process 19 cm   Just Proximal to Ulnar Styloid Process 14.2 cm   Across Hand at [employer] 17.9 cm   At [place] of 2nd Digit 6 cm       Patient was instructed today in a home exercise program today for post op shoulder range of motion. These included active assist shoulder flexion in sitting, scapular retraction, wall walking with shoulder abduction, and hands behind head external rotation.  She was encouraged to do these twice a day, holding 3 seconds and repeating 5 times when permitted by her physician.         PT Education - 01/24/15 1307    Education provided Yes   Education Details post op shoulder ROM HEP and lymphedema risk reduction   Person(s) Educated Patient   Methods Explanation;Demonstration;Handout   Comprehension Verbalized understanding              Breast Clinic Goals - 01/24/15 1315    Patient will be able to verbalize understanding of pertinent lymphedema risk reduction practices relevant to her diagnosis specifically related to skin care.   Baseline No knowledge   Time 1   Period Days   Status Achieved   Patient will be able to return demonstrate and/or verbalize understanding of the post-op home exercise program related to regaining shoulder range of motion.   Baseline No knowledge   Time 1   Period Days   Status Achieved   Patient will be able to verbalize understanding of the importance of attending the postoperative After Breast Cancer Class for further lymphedema risk reduction education and therapeutic exercise.   Baseline No knowledge   Time 1   Period Days   Status Achieved              Plan - 01/24/15 1310    Clinical Impression Statement Patient is a 80 year old woman recently diagnosed with Left Triple negative breast cancer.  She also has an area on her left  breast that requires further work up with a concern for bilateral breast cancer.  She is planning to have neoadjuvant chemotherapy followed by a likely right mastectomy but possibly a lumpectomy and a sentinel node biopsy  She will likely benefit from PT post operatively to regain shoulder ROM and strength and reduce lymphedema risk.   Pt will benefit from skilled therapeutic intervention in order to improve on the following deficits Decreased range of motion;Increased edema;Decreased knowledge of precautions;Impaired UE functional use;Pain;Decreased strength   Rehab  Potential Good   Clinical Impairments Affecting Rehab Potential none   PT Frequency One time visit   PT Treatment/Interventions Patient/family education;Therapeutic exercise   Consulted and Agree with Plan of Care Patient     Patient will follow up at outpatient cancer rehab if needed following surgery.  If the patient requires physical therapy at that time, a specific plan will be dictated and sent to the referring physician for approval. The patient was educated today on appropriate basic range of motion exercises to begin post operatively and the importance of attending the After Breast Cancer class following surgery.  Patient was educated today on lymphedema risk reduction practices as it pertains to recommendations that will benefit the patient immediately following surgery.  She verbalized good understanding.  No additional physical therapy is indicated at this time.         G-Codes - 02-23-15 1316    Functional Assessment Tool Used Clinical Judgement   Functional Limitation Other PT primary   Other PT Primary Current Status (M7680) At least 1 percent but less than 20 percent impaired, limited or restricted   Other PT Primary Goal Status (S8110) At least 1 percent but less than 20 percent impaired, limited or restricted   Other PT Primary Discharge Status (R1594) At least 1 percent but less than 20 percent impaired, limited or  restricted       Problem List Patient Active Problem List   Diagnosis Date Noted  . Breast cancer of lower-inner quadrant of right female breast 01/17/2015    Annia Friendly, PT Feb 23, 2015, 1:19 PM  [place], [place], [address] Phone: [phone]   Fax:  [phone]

## 2015-01-24 NOTE — Progress Notes (Signed)
Grace Paul is a very pleasant 80 y.o. 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  Biopsy results revealed the tumor's hormone status as ER negative, PR negative, and HER2/neu negative. Ki67 is 83%.  She presents today with her friend to the [facility]) for treatment consideration and recommendations from the breast surgeon, radiation oncologist, and medical oncologist.     I briefly met with Grace Paul and her friend during her [facility] visit today. We discussed the purpose of the Survivorship Clinic, which will include monitoring for recurrence, coordinating completion of age and gender-appropriate cancer screenings, promotion of overall wellness, as well as managing potential late/long-term side effects of anti-cancer treatments.    The treatment plan for Grace Paul will likely include neoadjuvant chemotherapy and surgery.  She will meet with the [employer] as well. As of today, the intent of treatment for Grace Paul is cure, therefore she will be eligible for the Survivorship Clinic upon her completion of treatment.  Her survivorship care plan (SCP) document will be drafted and updated throughout the course of her treatment trajectory. She will receive the SCP in an office visit with myself in the Survivorship Clinic once she has completed treatment.   Grace Paul was encouraged to ask questions and all questions were answered to her satisfaction.  She was given my business card and encouraged to contact me with any concerns regarding survivorship.  I look forward to participating in her care.   Mike Craze, NP [place] [phone]

## 2015-01-24 NOTE — Progress Notes (Signed)
Clinical Social Work [place] Psychosocial Distress Screening [place]  Patient completed distress screening protocol and scored a 10 on the Psychosocial Distress Thermometer which indicates severe distress. Clinical Social Worker met with patient in [facility] to assess for distress and other psychosocial needs. Patient expressed anxiety over the possibility of "losing both breast and chemotherapy", but stated she felt comfortable in her treatment team.  CSW and patient discussed coping skills to manage and decrease anxiety.  CSW and patient discussed taking treatment one step at time and not creating additional anxiety by looking at the "what ifs" and later stages of treatment.  Patient reported a history of traumatic events in her life that contributed to emotional/mental health issues.  Patient stated that she is now stronger and emotional healthy to "handle" the treatment journey.  CSW and patient discussed common feeling and emotions when being diagnosed with cancer. CSW and patient discussed the importance of support during treatment, and CSW informed patient of the support team and support services at [facility].  Patient also expressed some concern for transportation.  Once patients appointments are scheduled CSW will contact her to discuss transportation resources. CSW provided contact information and encouraged patient to call with any questions or concerns.  ONCBCN DISTRESS SCREENING 01/24/2015  Screening Type Initial Screening  Distress experienced in past week (1-10) 10  Practical problem type Transportation  Emotional problem type Nervousness/Anxiety;Adjusting to illness;Isolation/feeling alone;Feeling hopeless  Physical Problem type Loss of appetitie;Constipation/diarrhea;Other (comment)  Physician notified of physical symptoms Yes  Referral to clinical psychology No  Referral to clinical social work Yes  Referral to dietition No  Referral to financial advocate No  Referral to support programs Yes   Referral to palliative care No   [name], MSW, LCSW, OSW-C Clinical Social Worker [place] [phone]

## 2015-01-24 NOTE — Progress Notes (Signed)
[facility] NEW PATIENT EVALUATION  Name: Grace Paul MRN: [id number]  Date:   01/24/2015           DOB: 1935-03-25  Status: outpatient   CC: Birdie Riddle, MD  [name], MD    REFERRING PHYSICIAN: [name], MD   DIAGNOSIS: Clinical stage II A (T2 N0 M0) invasive ductal/DCIS of the right breast   HISTORY OF PRESENT ILLNESS:  Grace Paul is a 80 y.o. female who is seen today at the [facility] through the courtesy of Dr. Dalbert Batman for evaluation of her T2 N0 invasive ductal/DCIS of the right breast.  While lying down she noted a breast mass along the lower inner quadrant of the right breast.  She underwent mammography on 01/10/2015 at the [facility] where she was found to have a 2 cm irregular mass with associated pleomorphic calcifications with calcifications extending approximately 4.5 cm anterior, medial, and superior to the suspicious mass.  Additionally, there was a lobular mass measuring 0.5 cm along the inferior aspect of the right breast and felt to be dense tissue within the upper-outer quadrant of the left breast.  On ultrasound she was felt to have a 2.3 x 1.3 x 1.4 cm mass within the right breast at 4:00, 4 cm from the nipple.  Additionally, there was a 0.4 cm  mass at 4:00 within the right breast 2 cm from the nipple.  Within the left breast there was a 0.4 cm mass at 11:30, 2 cm from the nipple  A biopsy of the lower inner quadrant of the right breast was diagnostic for invasive ductal carcinoma/DCIS.  This was triple negative.  Ki-67 was 83%.  Biopsy of the upper left breast shows sclerosing ductal hyperplasia.  Breast MR on 01/18/2015 showed a biopsy-proven malignancy within the central and inferior medial right breast.  Also seen was the biopsy-proven sclerosing lesion within the medial central left breast with more suspicious enhancing nodular components extending up to 3.5 cm lateral and superior to the biopsy clip, worrisome for malignancy.   There is no evidence for abnormal appearing lymph nodes.  The patient is scheduled for MRI guided biopsies early next week.  She is without complaints today.  PREVIOUS RADIATION THERAPY: No   PAST MEDICAL HISTORY:  has a past medical history of Hypertension; Middle ear infection; GERD (gastroesophageal reflux disease); Chronic fatigue fibromyalgia syndrome; Alcoholism; Meniere disease; Post traumatic stress disorder; and Anxiety.     PAST SURGICAL HISTORY:  Past Surgical History  Procedure Laterality Date  . Tonsillectomy    . Dilatation and curettage       FAMILY HISTORY: family history includes Breast cancer in her cousin and father; Stroke in her father. Her mother died at age 28, in her father had breast cancer at age 29.   SOCIAL HISTORY:  reports that she quit smoking about 40 years ago. Her smoking use included Cigarettes. She smoked 2.00 packs per day. She does not have any smokeless tobacco history on file. She reports that she does not drink alcohol or use illicit drugs.  Widowed, no children.  She is unemployed and on disability from chronic fatigue syndrome.   ALLERGIES: Gentian violet and Cleocin   MEDICATIONS:  Current Outpatient Prescriptions  Medication Sig Dispense Refill  . acetaminophen (TYLENOL) 325 MG tablet Take 650 mg by mouth every 6 (six) hours as needed for mild pain or moderate pain.    Marland Kitchen amLODipine (NORVASC) 2.5 MG tablet   0  .  conjugated estrogens (PREMARIN) vaginal cream Place 1 Applicatorful vaginally daily. 42.5 g 0  . Cyanocobalamin (VITAMIN B 12) 100 MCG LOZG Take 1 tablet by mouth daily.    Marland Kitchen esomeprazole (NEXIUM) 40 MG capsule Take 40 mg by mouth daily as needed. For indigestion.    Marland Kitchen HYDROcodone-acetaminophen (NORCO/VICODIN) 5-325 MG per tablet   0  . meclizine (ANTIVERT) 12.5 MG tablet   0  . metoprolol succinate (TOPROL-XL) 25 MG 24 hr tablet Take 12.5 mg by mouth daily.    Marland Kitchen neomycin-polymyxin-hydrocortisone (CORTISPORIN) 3.5-10000-1 otic  suspension   0  . SF 1.1 % GEL dental gel   0  . temazepam (RESTORIL) 15 MG capsule Take 7.5 mg by mouth at bedtime as needed. For sleep.     No current facility-administered medications for this encounter.     REVIEW OF SYSTEMS:  Pertinent items are noted in HPI.    PHYSICAL EXAM:  Wt Readings from Last 3 Encounters:  01/24/15 116 lb 1.6 oz (52.663 kg)  10/10/12 119 lb (53.978 kg)   Temp Readings from Last 3 Encounters:  01/24/15 97.8 F (36.6 C) Oral  05/10/14 99.8 F (37.7 C) Oral  10/30/12 98.7 F (37.1 C) Oral   BP Readings from Last 3 Encounters:  01/24/15 140/64  05/10/14 169/95  10/30/12 176/75   Pulse Readings from Last 3 Encounters:  01/24/15 68  05/10/14 98  10/30/12 96   Head and neck examination: Grossly unremarkable.  Nodes: Without palpable cervical, supraclavicular, or axillary lymphadenopathy.  Breasts: There is a biopsy wound at approximately 6:00 along the lower aspect of the right breast with a palpable 3 similar mass along the lower inner quadrant of the right breast.  Left breast is without masses or lesions.  Extremities: Without edema.    LABORATORY DATA:  Lab Results  Component Value Date   WBC 7.9 01/24/2015   HGB 13.8 01/24/2015   HCT 42.0 01/24/2015   MCV 89.0 01/24/2015   PLT 221 01/24/2015   Lab Results  Component Value Date   NA 142 01/24/2015   K 3.9 01/24/2015   CO2 24 01/24/2015   Lab Results  Component Value Date   ALT 18 01/24/2015   AST 21 01/24/2015   ALKPHOS 71 01/24/2015   BILITOT 0.35 01/24/2015      IMPRESSION: Clinical stage IIA (T2 N0 M0) invasive ductal/DCIS of the right breast.  At the very least, she appears to have multicentric disease within the right breast.  She will complete her evaluation with MRI guided breast biopsies.  She very much desires breast preservation, and she is being offered neoadjuvant chemotherapy for her triple negative disease.  It is unlikely that she would be a candidate for right  breast preservation.  We discussed the role of radiation therapy following conservative surgery or post mastectomy.  We also discussed the potential acute and late toxicities of radiation therapy.   PLAN: As discussed above.  I spent 30  minutes face to face with the patient and more than 50% of that time was spent in counseling and/or coordination of care.
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
## 2015-01-24 NOTE — Progress Notes (Signed)
Subjective:     Patient ID: Grace Paul, female   DOB: 03/22/1935, 80 y.o.   MRN: [id number]  HPI   Review of Systems     Objective:   Physical Exam For the patient to understand and be given the tools to implement a healthy plant based diet during their cancer diagnosis.     Assessment:     Patient was seen today and found to be in good spirits and accompanied by the nurse navigator Matthews. Pts right breast afflicted with a possible diagnosis in her left breast as well. Pt is 5'1'' 116 pounds BMI 22. Pts current/relevant medications" nexium and vitamin B12. Pt has some mental disturbances. Pt states she does not have gastric reflux but her emotions are tied to her stomach and when she "gulps" she gets gastric upset. Pt starkly stated she will not gain weight when the patient spoke on ensuring she was eating enough throughout the day.  Pt states she takes a vitamin B12 for mental health.      Plan:     Dietitian educated the patient on implementing a plant based diet by incorporating more plant proteins, fruits, and vegetables. As a part of a healthy routine physical activity was discussed. Dietitian educated the pt on vitamin B12 and USP labels. The importance of legitimate, evidence based information was discussed and examples were given. A folder of evidence based information with a focus on a plant based diet and general nutrition during cancer was given to the patient.  As a part of the continuum of care the cancer dietitian's contact information was given to the patient in the event they would like to have a follow up appointment.

## 2015-01-24 NOTE — Assessment & Plan Note (Signed)
Right breast biopsy: Invasive ductal carcinoma with DCIS, grade 3, ER 0%, PR 0%, HER-2 negative ratio 1.16, Ki-67 83%; left breast biopsy complex sclerosing lesion MRI breast on the right side revealed a 6 cm non-mass enhancement and nodular enhancement and on left breast 4.5 cm of non-mass enhancement which is 3.5 cms adjacent to previously biopsied complex sclerosing lesion.  Pathology radiology counseling: I discussed the results of pathology report in great detail and provided her with a copy of this report. This was reviewed edema to his primary tumor board. Patient unfortunately appears to have bilateral breast cancers. I discussed the difference between invasive breast cancer and precancerous lesions like DCIS. We discussed the significance of ER/PR receptors as well as HER-2/neu receptors. Being negative for all 3 receptors makes her high risk of recurrence. Her Ki-67 is an 83% suggesting a very rapidly proliferative kind of breast cancer. He also discussed the significance of grade 3 disease.  Recommendation based on multidisciplinary tumor board: 1. Neoadjuvant chemotherapy with weekly Taxol and carboplatin may be tolerable given her age. 2. Followed by surgery 3. Followed by radiation if needed  Chemotherapy counseling: I discussed the risks and benefits of chemotherapy. Weekly Taxol and carboplatin tends to be more tolerable with moderate side effects. Nausea and vomiting for several low. Hair loss, risk of infection, nausea, neuropathy, cytopenias were discussed in great detail.

## 2015-01-25 ENCOUNTER — Telehealth: Payer: Self-pay | Admitting: *Deleted

## 2015-01-25 ENCOUNTER — Telehealth: Payer: Self-pay | Admitting: Hematology and Oncology

## 2015-01-25 NOTE — Telephone Encounter (Signed)
No additional note

## 2015-01-25 NOTE — Telephone Encounter (Signed)
Spoke with patient as she needed to reschedule her genetic and chemo ed appointments,done

## 2015-01-29 ENCOUNTER — Encounter: Payer: Self-pay | Admitting: *Deleted

## 2015-01-29 NOTE — Progress Notes (Signed)
Grace Paul  Holiday representative received referral from Art therapist for transportation concerns.  CSW contacted patient at home to offer support and assess for needs.  Patient stated she was planning to walk to her appointment but was encouraged not to by her medical team.  CSW and patient discussed transportation options, but due to time restraints CSW will have to arrange a taxi voucher.  CSW and patient also discussed [facility] for future appointments.  Patient was agreeable to a taxi voucher and verbalized understanding.  CSW contacted [employer] and provided taxi voucher for patients appointment tomorrow.  Grace Paul is scheduled to pick patient up at her home at 6:40am and transport her to [place].  Grace Paul is also scheduled to pick her up from [place] and transport her to [place] for her 9:30 appointment.  Nurse navigator will contact Grace Paul to inform of transportation plan.  CSW provided patient with [facility] contact information and encouraged her to call with questions or concerns.   Grace Paul, MSW, LCSW, OSW-C Clinical Social Worker [facility] [phone]

## 2015-01-30 ENCOUNTER — Ambulatory Visit
Admission: RE | Admit: 2015-01-30 | Discharge: 2015-01-30 | Disposition: A | Discharge status: Home / Self Care | Payer: Medicare Other | Source: Ambulatory Visit | Attending: Cardiovascular Disease | Admitting: Cardiovascular Disease

## 2015-01-30 ENCOUNTER — Telehealth: Payer: Self-pay | Admitting: *Deleted

## 2015-01-30 ENCOUNTER — Encounter (HOSPITAL_COMMUNITY): Payer: Self-pay | Admitting: *Deleted

## 2015-01-30 DIAGNOSIS — R928 Other abnormal and inconclusive findings on diagnostic imaging of breast: Secondary | ICD-10-CM

## 2015-01-30 MED ORDER — GADOBENATE DIMEGLUMINE 529 MG/ML IV SOLN: 10.0000 mL | Freq: Once | INTRAVENOUS | Status: AC | PRN

## 2015-01-30 NOTE — Telephone Encounter (Signed)
Spoke with patient and confirmed follow up with Dr. Lindi Adie for 4/28 at 845am. Encouraged her to call with any needs or concerns.

## 2015-01-31 ENCOUNTER — Encounter (HOSPITAL_COMMUNITY): Payer: Self-pay | Admitting: *Deleted

## 2015-02-01 ENCOUNTER — Other Ambulatory Visit: Payer: Medicare Other

## 2015-02-02 ENCOUNTER — Encounter: Payer: Self-pay | Admitting: *Deleted

## 2015-02-02 NOTE — Progress Notes (Signed)
[place] [facility]  Clinical Social Work was referred by patient navigator for assessment of psychosocial needs.  Clinical Social Worker contacted patient at home to offer support and assess for needs.  Pt requesting support/crisis number for the weekend. Pt stated she was doing well currently, just "needs it in case". CSW provided her with the [facility] [employer] number that is available to assist as needed over the weekend if she wants someone to talk with.   Clinical Social Work interventions: Resource education  Loren Racer, [place] Worker [place]  [place] Phone: [phone] Fax: [phone]
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
## 2015-02-06 NOTE — Progress Notes (Signed)
Called Gus Rankin in Operative Services to ask if a  CXR is still done after the Port-A-Cath is placed and she said yes.  Called Dr. Darrel Hoover office spoke with Abigail Butts in the Nursing office to get clarification about the CXR order should it be done pre op or after the placement of the Port-A-Cath.  She stated she would speak with Dr. Dalbert Batman and make the change in Epic for after the placement of the Port-A-Cath.

## 2015-02-06 NOTE — Progress Notes (Addendum)
Call back from [place] at [facility] with Dr. Darrel Hoover name who spoke with Dr. Dalbert Batman and he does want the CXR done pre op.  Information passes on to Short Stay to do the CXR pre op.

## 2015-02-07 ENCOUNTER — Encounter (HOSPITAL_COMMUNITY): Payer: Self-pay | Admitting: *Deleted

## 2015-02-07 ENCOUNTER — Ambulatory Visit (HOSPITAL_COMMUNITY): Payer: Medicare Other

## 2015-02-07 ENCOUNTER — Ambulatory Visit (HOSPITAL_COMMUNITY)
Admission: RE | Admit: 2015-02-07 | Discharge: 2015-02-07 | Disposition: A | Discharge status: Home / Self Care | Payer: Medicare Other | Source: Ambulatory Visit | Attending: General Surgery | Admitting: General Surgery

## 2015-02-07 ENCOUNTER — Emergency Department (HOSPITAL_COMMUNITY)
Admission: EM | Admit: 2015-02-07 | Discharge: 2015-02-07 | Disposition: A | Discharge status: Home / Self Care | Payer: Medicare Other | Attending: Emergency Medicine | Admitting: Emergency Medicine

## 2015-02-07 ENCOUNTER — Encounter (HOSPITAL_COMMUNITY): Payer: Self-pay | Admitting: Emergency Medicine

## 2015-02-07 ENCOUNTER — Ambulatory Visit (HOSPITAL_COMMUNITY): Payer: Medicare Other | Admitting: Anesthesiology

## 2015-02-07 ENCOUNTER — Encounter (HOSPITAL_COMMUNITY)
Admission: RE | Disposition: A | Discharge status: Home / Self Care | Payer: Self-pay | Source: Ambulatory Visit | Attending: General Surgery

## 2015-02-07 ENCOUNTER — Encounter: Payer: Self-pay | Admitting: *Deleted

## 2015-02-07 DIAGNOSIS — E780 Pure hypercholesterolemia: Secondary | ICD-10-CM | POA: Diagnosis not present

## 2015-02-07 DIAGNOSIS — Z8659 Personal history of other mental and behavioral disorders: Secondary | ICD-10-CM | POA: Insufficient documentation

## 2015-02-07 DIAGNOSIS — I10 Essential (primary) hypertension: Secondary | ICD-10-CM | POA: Insufficient documentation

## 2015-02-07 DIAGNOSIS — Z803 Family history of malignant neoplasm of breast: Secondary | ICD-10-CM | POA: Insufficient documentation

## 2015-02-07 DIAGNOSIS — R339 Retention of urine, unspecified: Secondary | ICD-10-CM | POA: Diagnosis not present

## 2015-02-07 DIAGNOSIS — F329 Major depressive disorder, single episode, unspecified: Secondary | ICD-10-CM | POA: Diagnosis not present

## 2015-02-07 DIAGNOSIS — C50311 Malignant neoplasm of lower-inner quadrant of right female breast: Secondary | ICD-10-CM | POA: Diagnosis not present

## 2015-02-07 DIAGNOSIS — K219 Gastro-esophageal reflux disease without esophagitis: Secondary | ICD-10-CM | POA: Insufficient documentation

## 2015-02-07 DIAGNOSIS — G40909 Epilepsy, unspecified, not intractable, without status epilepticus: Secondary | ICD-10-CM | POA: Diagnosis not present

## 2015-02-07 DIAGNOSIS — Z87891 Personal history of nicotine dependence: Secondary | ICD-10-CM | POA: Insufficient documentation

## 2015-02-07 DIAGNOSIS — C50412 Malignant neoplasm of upper-outer quadrant of left female breast: Secondary | ICD-10-CM | POA: Diagnosis not present

## 2015-02-07 DIAGNOSIS — R300 Dysuria: Secondary | ICD-10-CM | POA: Diagnosis present

## 2015-02-07 DIAGNOSIS — Z79899 Other long term (current) drug therapy: Secondary | ICD-10-CM | POA: Diagnosis not present

## 2015-02-07 DIAGNOSIS — Z452 Encounter for adjustment and management of vascular access device: Secondary | ICD-10-CM | POA: Diagnosis present

## 2015-02-07 DIAGNOSIS — Z95828 Presence of other vascular implants and grafts: Secondary | ICD-10-CM

## 2015-02-07 DIAGNOSIS — Z8669 Personal history of other diseases of the nervous system and sense organs: Secondary | ICD-10-CM | POA: Diagnosis not present

## 2015-02-07 DIAGNOSIS — Z01818 Encounter for other preprocedural examination: Secondary | ICD-10-CM

## 2015-02-07 DIAGNOSIS — Z171 Estrogen receptor negative status [ER-]: Secondary | ICD-10-CM | POA: Diagnosis not present

## 2015-02-07 DIAGNOSIS — F419 Anxiety disorder, unspecified: Secondary | ICD-10-CM | POA: Diagnosis not present

## 2015-02-07 DIAGNOSIS — R338 Other retention of urine: Secondary | ICD-10-CM

## 2015-02-07 HISTORY — DX: Palpitations: R00.2

## 2015-02-07 HISTORY — DX: Other psychoactive substance dependence, uncomplicated: F19.20

## 2015-02-07 HISTORY — DX: Unspecified convulsions: R56.9

## 2015-02-07 HISTORY — PX: PORTACATH PLACEMENT: SHX2246

## 2015-02-07 HISTORY — DX: Family history of other specified conditions: Z84.89

## 2015-02-07 LAB — URINALYSIS, ROUTINE W REFLEX MICROSCOPIC
Bilirubin Urine: NEGATIVE
Glucose, UA: NEGATIVE mg/dL
Hgb urine dipstick: NEGATIVE
Ketones, ur: NEGATIVE mg/dL
Leukocytes, UA: NEGATIVE
Nitrite: NEGATIVE
Protein, ur: NEGATIVE mg/dL
Specific Gravity, Urine: 1.013 (ref 1.005–1.030)
Urobilinogen, UA: 0.2 mg/dL (ref 0.0–1.0)
pH: 7 (ref 5.0–8.0)

## 2015-02-07 LAB — I-STAT CREATININE, ED: Creatinine, Ser: 0.7 mg/dL (ref 0.50–1.10)

## 2015-02-07 SURGERY — INSERTION, TUNNELED CENTRAL VENOUS DEVICE, WITH PORT
Anesthesia: General | Site: Chest

## 2015-02-07 MED ORDER — LACTATED RINGERS IV SOLN: INTRAVENOUS | Status: DC

## 2015-02-07 MED ORDER — PROPOFOL 10 MG/ML IV BOLUS
INTRAVENOUS | Status: DC | PRN
  Administered 2015-02-07: 120 mg via INTRAVENOUS

## 2015-02-07 MED ORDER — GLYCOPYRROLATE 0.2 MG/ML IJ SOLN
INTRAMUSCULAR | Status: DC | PRN
  Administered 2015-02-07: .6 mg via INTRAVENOUS

## 2015-02-07 MED ORDER — ACETAMINOPHEN 650 MG RE SUPP
650.0000 mg | RECTAL | Status: DC | PRN
  Filled 2015-02-07: qty 1

## 2015-02-07 MED ORDER — SODIUM CHLORIDE 0.9 % IV SOLN: 250.0000 mL | INTRAVENOUS | Status: DC | PRN

## 2015-02-07 MED ORDER — HYDROCODONE-ACETAMINOPHEN 5-325 MG PO TABS: 1.0000 | ORAL_TABLET | Freq: Four times a day (QID) | ORAL | Status: DC | PRN

## 2015-02-07 MED ORDER — LORAZEPAM 0.5 MG PO TABS
1.0000 mg | ORAL_TABLET | Freq: Once | ORAL | Status: AC
  Administered 2015-02-07: 1 mg via ORAL
  Filled 2015-02-07: qty 2

## 2015-02-07 MED ORDER — ROCURONIUM BROMIDE 100 MG/10ML IV SOLN
INTRAVENOUS | Status: DC | PRN
  Administered 2015-02-07: 20 mg via INTRAVENOUS

## 2015-02-07 MED ORDER — BUPIVACAINE-EPINEPHRINE 0.5% -1:200000 IJ SOLN
INTRAMUSCULAR | Status: AC
  Filled 2015-02-07: qty 1

## 2015-02-07 MED ORDER — CEFAZOLIN SODIUM-DEXTROSE 2-3 GM-% IV SOLR
INTRAVENOUS | Status: AC
  Filled 2015-02-07: qty 50

## 2015-02-07 MED ORDER — NEOSTIGMINE METHYLSULFATE 10 MG/10ML IV SOLN
INTRAVENOUS | Status: DC | PRN
  Administered 2015-02-07: 4 mg via INTRAVENOUS

## 2015-02-07 MED ORDER — FENTANYL CITRATE (PF) 100 MCG/2ML IJ SOLN
INTRAMUSCULAR | Status: DC | PRN
  Administered 2015-02-07 (×2): 50 ug via INTRAVENOUS

## 2015-02-07 MED ORDER — FENTANYL CITRATE (PF) 100 MCG/2ML IJ SOLN
25.0000 ug | INTRAMUSCULAR | Status: DC | PRN
  Administered 2015-02-07 (×2): 25 ug via INTRAVENOUS

## 2015-02-07 MED ORDER — MIDAZOLAM HCL 5 MG/5ML IJ SOLN
INTRAMUSCULAR | Status: DC | PRN
  Administered 2015-02-07 (×2): 1 mg via INTRAVENOUS

## 2015-02-07 MED ORDER — HEPARIN SOD (PORK) LOCK FLUSH 100 UNIT/ML IV SOLN
INTRAVENOUS | Status: AC
  Filled 2015-02-07: qty 5

## 2015-02-07 MED ORDER — HEPARIN SOD (PORK) LOCK FLUSH 100 UNIT/ML IV SOLN
INTRAVENOUS | Status: DC | PRN
  Administered 2015-02-07: 500 [IU]

## 2015-02-07 MED ORDER — FENTANYL CITRATE (PF) 100 MCG/2ML IJ SOLN
INTRAMUSCULAR | Status: AC
  Filled 2015-02-07: qty 2

## 2015-02-07 MED ORDER — ACETAMINOPHEN 325 MG PO TABS: 650.0000 mg | ORAL_TABLET | ORAL | Status: DC | PRN

## 2015-02-07 MED ORDER — CEFAZOLIN SODIUM-DEXTROSE 2-3 GM-% IV SOLR
2.0000 g | INTRAVENOUS | Status: AC
  Administered 2015-02-07: 2 g via INTRAVENOUS

## 2015-02-07 MED ORDER — MIDAZOLAM HCL 2 MG/2ML IJ SOLN
INTRAMUSCULAR | Status: AC
  Filled 2015-02-07: qty 2

## 2015-02-07 MED ORDER — NEOSTIGMINE METHYLSULFATE 10 MG/10ML IV SOLN
INTRAVENOUS | Status: AC
  Filled 2015-02-07: qty 1

## 2015-02-07 MED ORDER — LIDOCAINE HCL (CARDIAC) 20 MG/ML IV SOLN
INTRAVENOUS | Status: DC | PRN
  Administered 2015-02-07: 60 mg via INTRAVENOUS

## 2015-02-07 MED ORDER — SODIUM CHLORIDE 0.9 % IR SOLN
Freq: Once | Status: AC
  Administered 2015-02-07: 11:00:00
  Filled 2015-02-07: qty 1.2

## 2015-02-07 MED ORDER — ONDANSETRON HCL 4 MG/2ML IJ SOLN
INTRAMUSCULAR | Status: DC | PRN
  Administered 2015-02-07: 4 mg via INTRAVENOUS

## 2015-02-07 MED ORDER — SODIUM CHLORIDE 0.9 % IV SOLN: INTRAVENOUS | Status: DC

## 2015-02-07 MED ORDER — SUCCINYLCHOLINE CHLORIDE 20 MG/ML IJ SOLN
INTRAMUSCULAR | Status: DC | PRN
  Administered 2015-02-07: 100 mg via INTRAVENOUS

## 2015-02-07 MED ORDER — PROMETHAZINE HCL 25 MG/ML IJ SOLN
6.2500 mg | INTRAMUSCULAR | Status: DC | PRN
  Administered 2015-02-07: 6.25 mg via INTRAVENOUS
  Filled 2015-02-07: qty 1

## 2015-02-07 MED ORDER — PROPOFOL 10 MG/ML IV BOLUS
INTRAVENOUS | Status: AC
  Filled 2015-02-07: qty 20

## 2015-02-07 MED ORDER — OXYCODONE HCL 5 MG PO TABS: 5.0000 mg | ORAL_TABLET | ORAL | Status: DC | PRN

## 2015-02-07 MED ORDER — SODIUM CHLORIDE 0.9 % IJ SOLN: 3.0000 mL | INTRAMUSCULAR | Status: DC | PRN

## 2015-02-07 MED ORDER — LACTATED RINGERS IV SOLN
INTRAVENOUS | Status: DC
  Administered 2015-02-07: 1000 mL via INTRAVENOUS

## 2015-02-07 MED ORDER — GLYCOPYRROLATE 0.2 MG/ML IJ SOLN
INTRAMUSCULAR | Status: AC
  Filled 2015-02-07: qty 3

## 2015-02-07 MED ORDER — FENTANYL CITRATE (PF) 100 MCG/2ML IJ SOLN: 25.0000 ug | INTRAMUSCULAR | Status: DC | PRN

## 2015-02-07 MED ORDER — SODIUM CHLORIDE 0.9 % IJ SOLN: 3.0000 mL | Freq: Two times a day (BID) | INTRAMUSCULAR | Status: DC

## 2015-02-07 MED ORDER — CHLORHEXIDINE GLUCONATE 4 % EX LIQD: 1.0000 "application " | Freq: Once | CUTANEOUS | Status: DC

## 2015-02-07 MED ORDER — BUPIVACAINE-EPINEPHRINE (PF) 0.5% -1:200000 IJ SOLN
INTRAMUSCULAR | Status: DC | PRN
  Administered 2015-02-07: 10 mL

## 2015-02-07 MED ORDER — LORAZEPAM 1 MG PO TABS: 0.5000 mg | ORAL_TABLET | Freq: Three times a day (TID) | ORAL | Status: DC | PRN

## 2015-02-07 SURGICAL SUPPLY — 29 items
BAG DECANTER FOR FLEXI CONT (MISCELLANEOUS) ×2 IMPLANT
BLADE HEX COATED 2.75 (ELECTRODE) ×2 IMPLANT
BLADE SURG SZ10 CARB STEEL (BLADE) ×2 IMPLANT
DECANTER SPIKE VIAL GLASS SM (MISCELLANEOUS) ×2 IMPLANT
DRAPE C-ARM 42X120 X-RAY (DRAPES) ×2 IMPLANT
DRAPE LAPAROSCOPIC ABDOMINAL (DRAPES) ×2 IMPLANT
ELECT REM PT RETURN 9FT ADLT (ELECTROSURGICAL) ×2
ELECTRODE REM PT RTRN 9FT ADLT (ELECTROSURGICAL) ×1 IMPLANT
GAUZE SPONGE 4X4 16PLY XRAY LF (GAUZE/BANDAGES/DRESSINGS) ×2 IMPLANT
GLOVE BIOGEL PI IND STRL 7.0 (GLOVE) ×1 IMPLANT
GLOVE BIOGEL PI INDICATOR 7.0 (GLOVE) ×2
GLOVE EUDERMIC 7 POWDERFREE (GLOVE) ×4 IMPLANT
GOWN STRL REUS W/TWL LRG LVL3 (GOWN DISPOSABLE) ×1 IMPLANT
GOWN STRL REUS W/TWL XL LVL3 (GOWN DISPOSABLE) ×3 IMPLANT
KIT BASIN OR (CUSTOM PROCEDURE TRAY) ×2 IMPLANT
KIT PORT POWER 8FR ISP CVUE (Catheter) ×1 IMPLANT
MARKER SKIN DUAL TIP RULER LAB (MISCELLANEOUS) ×2 IMPLANT
NDL HYPO 25X1 1.5 SAFETY (NEEDLE) ×1 IMPLANT
NEEDLE HYPO 22GX1.5 SAFETY (NEEDLE) ×2 IMPLANT
NEEDLE HYPO 25X1 1.5 SAFETY (NEEDLE) ×2 IMPLANT
PACK BASIC VI WITH GOWN DISP (CUSTOM PROCEDURE TRAY) ×2 IMPLANT
PENCIL BUTTON HOLSTER BLD 10FT (ELECTRODE) ×2 IMPLANT
SUT MNCRL AB 4-0 PS2 18 (SUTURE) ×2 IMPLANT
SUT PROLENE 2 0 SH DA (SUTURE) ×3 IMPLANT
SUT VIC AB 3-0 SH 18 (SUTURE) ×2 IMPLANT
SYR BULB IRRIGATION 50ML (SYRINGE) ×2 IMPLANT
SYR CONTROL 10ML LL (SYRINGE) ×2 IMPLANT
SYRINGE 10CC LL (SYRINGE) ×2 IMPLANT
TOWEL OR 17X26 10 PK STRL BLUE (TOWEL DISPOSABLE) ×2 IMPLANT

## 2015-02-07 NOTE — Discharge Instructions (Signed)
Return here as needed. Follow up with the Urologist for follow up on your catheter and urinary issues.
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
## 2015-02-07 NOTE — ED Provider Notes (Signed)
CSN: [id number]     Arrival date & time 02/07/15  1556 History  This chart was scribed for non-physician practitioner, Dalia Heading, PA-C, working with Ezequiel Essex, MD, by Delphia Grates, ED Scribe. This patient was seen in room TR10C/TR10C and the patient's care was started at 4:44 PM.     Chief Complaint  Patient presents with  . Dysuria    The history is provided by the patient. No language interpreter was used.     HPI Comments: Grace Paul is a 80 y.o. female who presents to the Emergency Department complaining of difficulty urinating since this morning. Patient report recent port a cath placement this morning at [facility] and admits to being under general anesthesia during the procedure. Since placement, patient states she has been unable to urinate aside from small "trickles" and notes associated urgency and moderate pain and discomfort. She denies history of prior similar episodes. Patient states she called her oncologist and was informed to come to the ED. She denies any other symptoms.  Past Medical History  Diagnosis Date  . Hypertension   . Middle ear infection   . GERD (gastroesophageal reflux disease)   . Chronic fatigue fibromyalgia syndrome   . Alcoholism   . Meniere disease   . Post traumatic stress disorder   . Anxiety   . Palpitations   . Seizures     seizure due to thorazine   . Family history of adverse reaction to anesthesia     problems with anesthesia   . Addiction to drug 1971    addicted to Valium. Does not want to take any antidepressants   Past Surgical History  Procedure Laterality Date  . Tonsillectomy    . Dilatation and curettage      x2   Family History  Problem Relation Age of Onset  . Stroke Father   . Breast cancer Father   . Breast cancer Cousin    History  Substance Use Topics  . Smoking status: Former Smoker -- 2.00 packs/day    Types: Cigarettes    Quit date: 06/10/1974  . Smokeless tobacco: Never Used  . Alcohol Use: No      Comment: recovered  alcoholic -none since 0/7/37   OB History    No data available     Review of Systems  Constitutional: Negative for fever.  Genitourinary: Positive for dysuria, urgency and difficulty urinating.      Allergies  Gentian violet; Clarithromycin; Cleocin; Compazine; Epinephrine; and Hyoscyamine sulfate  Home Medications   Prior to Admission medications   Medication Sig Start Date End Date Taking? Authorizing Provider  acetaminophen (TYLENOL) 325 MG tablet Take 650 mg by mouth every 6 (six) hours as needed for mild pain or moderate pain.    Historical Provider, MD  amLODipine (NORVASC) 2.5 MG tablet Take 2.5 mg by mouth every morning. Patient monitors blood pressure so does not necessarily take every day 01/03/15   Historical Provider, MD  Cyanocobalamin (VITAMIN B 12) 100 MCG LOZG Take 1 tablet by mouth daily. Patient does not take every day    Historical Provider, MD  esomeprazole (NEXIUM) 40 MG capsule Take 40 mg by mouth daily as needed. For indigestion.    Historical Provider, MD  HYDROcodone-acetaminophen (NORCO) 5-325 MG per tablet Take 1-2 tablets by mouth every 6 (six) hours as needed. 02/07/15   [name], MD  HYDROcodone-acetaminophen (NORCO/VICODIN) 5-325 MG per tablet Take 0.5-1 tablets by mouth every 6 (six) hours as needed for moderate pain.  12/18/14   Historical Provider, MD  meclizine (ANTIVERT) 12.5 MG tablet Take 12.5 mg by mouth 2 (two) times daily as needed for dizziness.  12/15/14   Historical Provider, MD  metoprolol succinate (TOPROL-XL) 25 MG 24 hr tablet Take 12.5 mg by mouth 2 (two) times daily.     Historical Provider, MD  neomycin-polymyxin-hydrocortisone (CORTISPORIN) 3.5-10000-1 otic suspension Place 3 drops into both ears daily as needed (when ear bothers her).  12/14/14   Historical Provider, MD  temazepam (RESTORIL) 15 MG capsule Take 7.5 mg by mouth at bedtime. For sleep.    Historical Provider, MD   Triage Vitals: BP 153/66 mmHg   Pulse 99  Temp(Src) 98.1 F (36.7 C) (Oral)  Resp 20  Ht 5' (1.524 m)  Wt 115 lb (52.164 kg)  BMI 22.46 kg/m2  SpO2 97%  Physical Exam  Constitutional: She is oriented to person, place, and time. She appears well-developed and well-nourished. No distress.  HENT:  Head: Normocephalic and atraumatic.  Eyes: Conjunctivae and EOM are normal.  Neck: Neck supple. No tracheal deviation present.  Cardiovascular: Normal rate, regular rhythm and normal heart sounds.   Pulmonary/Chest: Effort normal and breath sounds normal. No respiratory distress.  Musculoskeletal: Normal range of motion.  Neurological: She is alert and oriented to person, place, and time.  Skin: Skin is warm and dry.  Psychiatric: She has a normal mood and affect. Her behavior is normal.  Nursing note and vitals reviewed.   ED Course  Procedures (including critical care time)  DIAGNOSTIC STUDIES: Oxygen Saturation is 97% on room air, adequate by my interpretation.    COORDINATION OF CARE: At 7948 Discussed treatment plan with patient which includes UA. Patient agrees.   Labs Review Labs Reviewed - No data to display  Imaging Review Chest 2 View  02/07/2015   CLINICAL DATA:  Port-A-Cath placement preoperative chest.  EXAM: CHEST  2 VIEW  COMPARISON:  None.  FINDINGS: Mediastinum hilar structures normal. Lungs are clear. No pleural effusion or pneumothorax. Chest stable from prior exam. No acute bony abnormality .  IMPRESSION: No active cardiopulmonary disease.   Electronically Signed   By: Marcello Moores  Register   On: 02/07/2015 09:09   Dg Chest Port 1 View  02/07/2015   CLINICAL DATA:  Port-A-Cath placement.  EXAM: PORTABLE CHEST - 1 VIEW  COMPARISON:  02/07/2015  FINDINGS: Power port has been inserted. The tip is in the superior vena cava 2.7 cm below the carina in good position.  No pneumothorax. Heart size and vascularity are normal. Minimal atelectasis at the left lung base medially. No effusion. No osseous  abnormality.  IMPRESSION: Port-A-Cath in good position. Minimal atelectasis at the left lung base medially.   Electronically Signed   By: Lorriane Shire M.D.   On: 02/07/2015 12:03   Dg C-arm 1-60 Min-no Report  02/07/2015   CLINICAL DATA: insertion cath   C-ARM 1-60 MINUTES  Fluoroscopy was utilized by the requesting physician.  No radiographic  interpretation.     Patient is referred to urology.  Told to return here as needed.  Patient is anxious about her medical conditions   Dalia Heading, PA-C 02/10/15 0215  Ezequiel Essex, MD 02/10/15 [phone]

## 2015-02-07 NOTE — ED Notes (Signed)
Pt sts that she hasn't urinated since early this morning prior to having a port a cath placed. Pt unable to stand still.

## 2015-02-07 NOTE — ED Notes (Signed)
Spoke to [place], Education officer, museum about pt's current situation and concerns.  Will come to speak with pt.

## 2015-02-07 NOTE — ED Notes (Signed)
Pt experiencing large amounts of stress in room at this time.  Pt is shaking uncontrollably and very concerned about how she will care for herself and her dog when she gets home.

## 2015-02-07 NOTE — Anesthesia Preprocedure Evaluation (Signed)
Anesthesia Evaluation  Patient identified by MRN, date of birth, ID band Patient awake    Reviewed: Allergy & Precautions, H&P , NPO status , Patient's Chart, lab work & pertinent test results, reviewed documented beta blocker date and time   Airway Mallampati: II  TM Distance: >3 FB Neck ROM: full    Dental  (+) Missing, Dental Advisory Given Many missing front teeth:   Pulmonary neg pulmonary ROS, former smoker,  breath sounds clear to auscultation  Pulmonary exam normal       Cardiovascular Exercise Tolerance: Good hypertension, Pt. on medications and Pt. on home beta blockers Rhythm:regular Rate:Normal  palpitations   Neuro/Psych Seizures -,  Anxiety Seizure with thorazine negative neurological ROS  negative psych ROS   GI/Hepatic negative GI ROS, GERD-  Medicated and Controlled,  Endo/Other  negative endocrine ROS  Renal/GU negative Renal ROS  negative genitourinary   Musculoskeletal   Abdominal   Peds  Hematology negative hematology ROS (+)   Anesthesia Other Findings Breast cancer  Reproductive/Obstetrics negative OB ROS                             Anesthesia Physical Anesthesia Plan  ASA: III  Anesthesia Plan: General   Post-op Pain Management:    Induction: Intravenous  Airway Management Planned: LMA  Additional Equipment:   Intra-op Plan:   Post-operative Plan:   Informed Consent: I have reviewed the patients History and Physical, chart, labs and discussed the procedure including the risks, benefits and alternatives for the proposed anesthesia with the patient or authorized representative who has indicated his/her understanding and acceptance.   Dental Advisory Given  Plan Discussed with: CRNA and Surgeon  Anesthesia Plan Comments:         Anesthesia Quick Evaluation

## 2015-02-07 NOTE — Discharge Instructions (Signed)
Implanted Port Home Guide °An implanted port is a type of central line that is placed under the skin. Central lines are used to provide IV access when treatment or nutrition needs to be given through a person's veins. Implanted ports are used for long-term IV access. An implanted port may be placed because:  °· You need IV medicine that would be irritating to the small veins in your hands or arms.   °· You need long-term IV medicines, such as antibiotics.   °· You need IV nutrition for a long period.   °· You need frequent blood draws for lab tests.   °· You need dialysis.   °Implanted ports are usually placed in the chest area, but they can also be placed in the upper arm, the abdomen, or the leg. An implanted port has two main parts:  °· Reservoir. The reservoir is round and will appear as a small, raised area under your skin. The reservoir is the part where a needle is inserted to give medicines or draw blood.   °· Catheter. The catheter is a thin, flexible tube that extends from the reservoir. The catheter is placed into a large vein. Medicine that is inserted into the reservoir goes into the catheter and then into the vein.   °HOW WILL I CARE FOR MY INCISION SITE? °Do not get the incision site wet. Bathe or shower as directed by your health care provider.  °HOW IS MY PORT ACCESSED? °Special steps must be taken to access the port:  °· Before the port is accessed, a numbing cream can be placed on the skin. This helps numb the skin over the port site.   °· Your health care provider uses a sterile technique to access the port. °· Your health care provider must put on a mask and sterile gloves. °· The skin over your port is cleaned carefully with an antiseptic and allowed to dry. °· The port is gently pinched between sterile gloves, and a needle is inserted into the port. °· Only "non-coring" port needles should be used to access the port. Once the port is accessed, a blood return should be checked. This helps  ensure that the port is in the vein and is not clogged.   °· If your port needs to remain accessed for a constant infusion, a clear (transparent) bandage will be placed over the needle site. The bandage and needle will need to be changed every week, or as directed by your health care provider.   °· Keep the bandage covering the needle clean and dry. Do not get it wet. Follow your health care provider's instructions on how to take a shower or bath while the port is accessed.   °· If your port does not need to stay accessed, no bandage is needed over the port.   °WHAT IS FLUSHING? °Flushing helps keep the port from getting clogged. Follow your health care provider's instructions on how and when to flush the port. Ports are usually flushed with saline solution or a medicine called heparin. The need for flushing will depend on how the port is used.  °· If the port is used for intermittent medicines or blood draws, the port will need to be flushed:   °· After medicines have been given.   °· After blood has been drawn.   °· As part of routine maintenance.   °· If a constant infusion is running, the port may not need to be flushed.   °HOW LONG WILL MY PORT STAY IMPLANTED? °The port can stay in for as long as your health care   provider thinks it is needed. When it is time for the port to come out, surgery will be done to remove it. The procedure is similar to the one performed when the port was put in.  WHEN SHOULD I SEEK IMMEDIATE MEDICAL CARE? When you have an implanted port, you should seek immediate medical care if:   You notice a bad smell coming from the incision site.   You have swelling, redness, or drainage at the incision site.   You have more swelling or pain at the port site or the surrounding area.   You have a fever that is not controlled with medicine. Document Released: 10/06/2005 Document Revised: 07/27/2013 Document Reviewed: 06/13/2013 [facility] Patient Information 2015 [place], [place]. This  information is not intended to replace advice given to you by your health care provider. Make sure you discuss any questions you have with your health care provider.            PORT-A-CATH: POST OP INSTRUCTIONS  Always review your discharge instruction sheet given to you by the facility where your surgery was performed.   1. A prescription for pain medication may be given to you upon discharge. Take your pain medication as prescribed, if needed. If narcotic pain medicine is not needed, then you make take acetaminophen (Tylenol) or ibuprofen (Advil) as needed.  2. Take your usually prescribed medications unless otherwise directed. 3. If you need a refill on your pain medication, please contact our office. All narcotic pain medicine now requires a paper prescription.  Phoned in and fax refills are no longer allowed by law.  Prescriptions will not be filled after 5 pm or on weekends.  4. You should follow a light diet for the remainder of the day after your procedure. 5. Most patients will experience some mild swelling and/or bruising in the area of the incision. It may take several days to resolve. 6. It is common to experience some constipation if taking pain medication after surgery. Increasing fluid intake and taking a stool softener (such as Colace) will usually help or prevent this problem from occurring. A mild laxative (Milk of Magnesia or Miralax) should be taken according to package directions if there are no bowel movements after 48 hours.  7. Unless discharge instructions indicate otherwise, you may remove your bandages 48 hours after surgery, and you may shower at that time. You may have steri-strips (small white skin tapes) in place directly over the incision.  These strips should be left on the skin for 7-10 days.  If your surgeon used Dermabond (skin glue) on the incision, you may shower in 24 hours.  The glue will flake off over the next 2-3 weeks.  8. If your port is left  accessed at the end of surgery (needle left in port), the dressing cannot get wet and should only by changed by a healthcare professional. When the port is no longer accessed (when the needle has been removed), follow step 7.   9. ACTIVITIES:  Limit activity involving your arms for the next 72 hours. Do no strenuous exercise or activity for 1 week. You may drive when you are no longer taking prescription pain medication, you can comfortably wear a seatbelt, and you can maneuver your car. 10.You may need to see your doctor in the office for a follow-up appointment.  Please       check with your doctor.  11.When you receive a new Port-a-Cath, you will get a product guide and  ID card.  Please keep them in case you need them.  WHEN TO CALL YOUR DOCTOR [phone]): 1. Fever over 101.0 2. Chills 3. Continued bleeding from incision 4. Increased redness and tenderness at the site 5. Shortness of breath, difficulty breathing   The clinic staff is available to answer your questions during regular business hours. Please dont hesitate to call and ask to speak to one of the nurses or medical assistants for clinical concerns. If you have a medical emergency, go to the nearest emergency room or call 911.  A surgeon from [facility] is always on call at the hospital.     For further information, please visit [email or web address]     General Anesthesia, Care After Refer to this sheet in the next few weeks. These instructions provide you with information on caring for yourself after your procedure. Your health care provider may also give you more specific instructions. Your treatment has been planned according to current medical practices, but problems sometimes occur. Call your health care provider if you have any problems or questions after your procedure. WHAT TO EXPECT AFTER THE PROCEDURE After the procedure, it is typical to experience:  Sleepiness.  Nausea and  vomiting. HOME CARE INSTRUCTIONS  For the first 24 hours after general anesthesia:  Have a responsible person with you.  Do not drive a car. If you are alone, do not take public transportation.  Do not drink alcohol.  Do not take medicine that has not been prescribed by your health care provider.  Do not sign important papers or make important decisions.  You may resume a normal diet and activities as directed by your health care provider.  Change bandages (dressings) as directed.  If you have questions or problems that seem related to general anesthesia, call the hospital and ask for the anesthetist or anesthesiologist on call. SEEK MEDICAL CARE IF:  You have nausea and vomiting that continue the day after anesthesia.  You develop a rash. SEEK IMMEDIATE MEDICAL CARE IF:   You have difficulty breathing.  You have chest pain.  You have any allergic problems. Document Released: 01/12/2001 Document Revised: 10/11/2013 Document Reviewed: 04/21/2013 [facility] Patient Information 2015 [place], [place]. This information is not intended to replace advice given to you by your health care provider. Make sure you discuss any questions you have with your health care provider.

## 2015-02-07 NOTE — Progress Notes (Signed)
[place] Clinical Social Work  Holiday representative received phone call from patient expressing concern for transportation to her appointment on 02/08/15.  Patient stated she did not feel she would be able to use the bus system.  CSw and patient discussed transportation resources.  Due to time requirements resources are limited; therefore CSW arranged a taxi to pick patient up at her home at 9:10 for her 10:00 appointment.  Patient stated she would arrange transportation home after her appointment.  Patient was also agreeable to completing a SCAT application during her appointment time at [facility].  Patient stated she felt comfortable using SCAT for transportation to her future appointments.  [name], MSW, LCSW, OSW-C Clinical Social Worker [facility] [phone]

## 2015-02-07 NOTE — Anesthesia Postprocedure Evaluation (Signed)
  Anesthesia Post-op Note  Patient: Grace Paul  Procedure(s) Performed: Procedure(s) (LRB): INSERTION PORT-A-CATH ULTRASOUND STANDBY (N/A)  Patient Location: PACU  Anesthesia Type: General  Level of Consciousness: awake and alert   Airway and Oxygen Therapy: Patient Spontanous Breathing  Post-op Pain: mild  Post-op Assessment: Post-op Vital signs reviewed, Patient's Cardiovascular Status Stable, Respiratory Function Stable, Patent Airway and No signs of Nausea or vomiting  Last Vitals:  Filed Vitals:   02/07/15 1214  BP:   Pulse: 60  Temp: 36.5 C  Resp: 19    Post-op Vital Signs: stable   Complications: No apparent anesthesia complications

## 2015-02-07 NOTE — Interval H&P Note (Signed)
History and Physical Interval Note:  02/07/2015 9:45 AM  Grace Paul  has presented today for surgery, with the diagnosis of breast cancer  The various methods of treatment have been discussed with the patient and family. After consideration of risks, benefits and other options for treatment, the patient has consented to  Procedure(s): INSERTION PORT-A-CATH ULTRASOUND STANDBY (N/A) as a surgical intervention .  The patient's history has been reviewed, patient examined, no change in status, stable for surgery.  I have reviewed the patient's chart and labs.  Questions were answered to the patient's satisfaction.     Adin Hector

## 2015-02-07 NOTE — Transfer of Care (Signed)
Immediate Anesthesia Transfer of Care Note  Patient: Grace Paul  Procedure(s) Performed: Procedure(s): INSERTION PORT-A-CATH ULTRASOUND STANDBY (N/A)  Patient Location: PACU  Anesthesia Type:General  Level of Consciousness: awake, alert  and oriented  Airway & Oxygen Therapy: Patient Spontanous Breathing and Patient connected to face mask oxygen  Post-op Assessment: Report given to RN and Post -op Vital signs reviewed and stable  Post vital signs: Reviewed and stable  Last Vitals:  Filed Vitals:   02/07/15 0815  BP: 152/54  Pulse: 77  Temp: 36.7 C  Resp: 18    Complications: No apparent anesthesia complications

## 2015-02-07 NOTE — Progress Notes (Signed)
ED CM received a consult from [facility] in [employer]. Concerning patient. Patient presented to [facility] ED with urinary retention after undergoing a port cath placement for breast cancer treatment at [facility] [place] today.  Urinary Catheter placed drained 300 mls of urine, patient will be discharged with catheter attached to  leg bag. Met with patient at bedside, patient appears anxious, and voiced being concerned on caring for self, with catheter. Patient reports not having any family or friends to assist her. CM discussed with patient that [place] [phone] will be about to provide patient with additional cancer support resources patient agreeable. Discussed  Recommendation for [facility] services, for catheter management, she is agreeable with disposition plan.Offered choice of [place] agency provided list. Patient selected, [facility], verified demographics.  Referral called in and faxed spoke with Andra at [phone], received fax confirmation. Discussed with C. Lawyer PA-C , and Katlynne RN they agree with plan. ED CM will follow up tomorrow.

## 2015-02-07 NOTE — ED Notes (Signed)
This RN stayed at bedside with pt discussing needs as well as using distraction techniques to calm patient.  Patient okay with RN leaving room at this time.

## 2015-02-08 ENCOUNTER — Telehealth: Payer: Self-pay | Admitting: *Deleted

## 2015-02-08 ENCOUNTER — Other Ambulatory Visit: Payer: Medicare Other

## 2015-02-08 ENCOUNTER — Encounter (HOSPITAL_COMMUNITY): Payer: Self-pay | Admitting: General Surgery

## 2015-02-08 ENCOUNTER — Other Ambulatory Visit: Payer: Self-pay | Admitting: Hematology and Oncology

## 2015-02-08 ENCOUNTER — Telehealth: Payer: Self-pay

## 2015-02-08 ENCOUNTER — Encounter: Payer: Self-pay | Admitting: *Deleted

## 2015-02-08 DIAGNOSIS — C50311 Malignant neoplasm of lower-inner quadrant of right female breast: Secondary | ICD-10-CM

## 2015-02-08 MED ORDER — ONDANSETRON HCL 8 MG PO TABS: 8.0000 mg | ORAL_TABLET | Freq: Three times a day (TID) | ORAL | Status: DC | PRN

## 2015-02-08 MED ORDER — PROCHLORPERAZINE MALEATE 10 MG PO TABS: 10.0000 mg | ORAL_TABLET | Freq: Four times a day (QID) | ORAL | Status: DC | PRN

## 2015-02-08 NOTE — Telephone Encounter (Signed)
Spoke with patient and confirmed appointments for chemo education class for 02/13/15 at 12N and genetic counseling on 4/28 at 930 after her visit with Dr. Lindi Paul.  She states she will have her friend Grace Paul take her to these appointments.  Instructed her to call me if something did not work out with her transportation so we can arrange something.  She verbalize understanding.

## 2015-02-08 NOTE — Telephone Encounter (Signed)
Spoke with patient this am.  I have cancelled her appointments for today and informed Abby Potash to cancel her cab.  She is very overwhelmed with everything that has happened and does not feel she can come in for her appointments.   Assured her that I would take care of rescheduling her appointments and for her to get some rest through the weekend and I would reschedule her chemo education class and genetics for next week.  She felt much better about that.  Informed her I would call her back with her appointments.

## 2015-02-08 NOTE — Telephone Encounter (Signed)
Call report rcvd from [place] 02/08/15 6:53 am - pt not feeling weel, unable to come in for appointments.  Chemo edu and Genetics notified.  POF entered to re-schedule.  Msg left for SW re: transportation arrangements. Call report rcvd from [place] 02/07/15 3:41 pm - pt unable to urinate.  Pt hung up on them before they could triage her.  Chart review - pt did go to ED and was in and out cathed and sent home.  MD notified.   Both reports sent to scan.

## 2015-02-09 ENCOUNTER — Other Ambulatory Visit: Payer: Self-pay

## 2015-02-09 ENCOUNTER — Telehealth: Payer: Self-pay | Admitting: Hematology and Oncology

## 2015-02-09 DIAGNOSIS — C50311 Malignant neoplasm of lower-inner quadrant of right female breast: Secondary | ICD-10-CM

## 2015-02-09 MED ORDER — LIDOCAINE-PRILOCAINE 2.5-2.5 % EX CREA: 1.0000 "application " | TOPICAL_CREAM | CUTANEOUS | Status: DC | PRN

## 2015-02-09 MED ORDER — LIDOCAINE-PRILOCAINE 2.5-2.5 % EX CREA: TOPICAL_CREAM | CUTANEOUS | Status: DC | PRN

## 2015-02-09 NOTE — Telephone Encounter (Signed)
Returned call to patient regarding transportation assistance. Patient's initial voicemail was forwarded to [place] (SW) and patient was informed on of the SW's will call her. Next appointment 02/13/15 @ 12pm. Appointment confirmed with patient.

## 2015-02-09 NOTE — Telephone Encounter (Signed)
appointments made and avs will be printed for patient at 4/26 chemo class

## 2015-02-12 ENCOUNTER — Encounter: Payer: Self-pay | Admitting: *Deleted

## 2015-02-12 ENCOUNTER — Telehealth: Payer: Self-pay | Admitting: *Deleted

## 2015-02-12 NOTE — Telephone Encounter (Signed)
PT. IS BEING SEEN BY [facility]. PT. IS ASKING WHEN WILL THE FOLEY CATHETER COME OUT OF HER. INSTRUCTED PT. TO CALL [facility] CONCERNING ORDERS FROM DR.LAWYER. HE WAS THE EMERGENCY DEPARTMENT PHYSICIAN WHO ORDERED [facility] FOR PT. SHE VOICES UNDERSTANDING.

## 2015-02-12 NOTE — Progress Notes (Signed)
[place] Clinical Social Work  Holiday representative contacted patient at home to address transportation concerns.  Patient was scheduled to arrive by taxi to her appointment last week, but was canceled due to patient going to the ED.  CSW and patient discussed new appointment time and CSW will arrange taxi to transport patient to her appointment on 02/13/15.  Patient is scheduled to meet with CSW prior to her appointment to complete SCAT application.    [name], MSW, LCSW, OSW-C Clinical Social Worker [facility] [phone]

## 2015-02-13 ENCOUNTER — Other Ambulatory Visit: Payer: Medicare Other

## 2015-02-13 ENCOUNTER — Encounter: Payer: Self-pay | Admitting: *Deleted

## 2015-02-13 NOTE — Progress Notes (Signed)
[place] Work  Holiday representative met with patient in office at [facility] to offer support and review SCAT application.  CSW and patient discussed and completed application.  CSW submitted application and application was approved.  CSW provided approval information to patient and contact information for scheduling transportation.  Patient verbalized understanding and plans to call today to schedule transportation for her upcoming appointments.    [name], MSW, LCSW, OSW-C Clinical Social Worker [facility] [phone]

## 2015-02-14 NOTE — Assessment & Plan Note (Addendum)
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 She has a different cancer in left breast compared to the right.  Recommendation: 1. Neoadjuvant chemotherapy with weekly Taxol and carboplatin may be tolerable given her age. 2. Followed by surgery 3. Followed by radiation if needed 4. Followed by Anti estrogen therapy There is no role of antiestrogen therapy because she is ER/PR negative.  RTC with start of chemo.

## 2015-02-15 ENCOUNTER — Other Ambulatory Visit: Payer: Medicare Other

## 2015-02-15 ENCOUNTER — Ambulatory Visit (HOSPITAL_BASED_OUTPATIENT_CLINIC_OR_DEPARTMENT_OTHER): Payer: Medicare Other | Admitting: Hematology and Oncology

## 2015-02-15 ENCOUNTER — Telehealth: Payer: Self-pay | Admitting: *Deleted

## 2015-02-15 VITALS — BP 146/77 | HR 81 | Temp 97.8°F | Resp 18 | Ht 60.0 in | Wt 112.8 lb

## 2015-02-15 DIAGNOSIS — C50311 Malignant neoplasm of lower-inner quadrant of right female breast: Secondary | ICD-10-CM

## 2015-02-15 DIAGNOSIS — C50412 Malignant neoplasm of upper-outer quadrant of left female breast: Secondary | ICD-10-CM

## 2015-02-15 MED ORDER — LORAZEPAM 1 MG PO TABS: 0.5000 mg | ORAL_TABLET | Freq: Three times a day (TID) | ORAL | Status: DC | PRN

## 2015-02-15 NOTE — Progress Notes (Signed)
Patient Care Team: Dixie Dials, MD as PCP - General (Cardiology) [name], MD as Consulting Physician (General Surgery) Nicholas Lose, MD as Consulting Physician (Hematology and Oncology) Arloa Koh, MD as Consulting Physician (Radiation Oncology) Rockwell Germany, RN as Registered Nurse Mauro Kaufmann, RN as Registered Nurse Holley Bouche, NP as Nurse Practitioner (Nurse Practitioner)  DIAGNOSIS: Breast cancer of lower-inner quadrant of right female breast   Staging form: Breast, AJCC 7th Edition     Clinical stage from 01/24/2015: Stage IIA (T2, N0, M0) - Unsigned   SUMMARY OF ONCOLOGIC HISTORY:   Breast cancer of lower-inner quadrant of right female breast   01/10/2015 Imaging Ultrasound breast: Right breast 4:00: 2.3 cm lesion, at 4:00 6 cm from nipple to 0.4 cm hypoechoic mass, left breast 1130; 2 cm from nipple 0.4 cm mass   01/12/2015 Initial Diagnosis Right breast biopsy: Invasive ductal carcinoma with DCIS, grade 3, ER 0%, PR 0%, HER-2 negative ratio 1.16, Ki-67 83%; left breast biopsy complex sclerosing lesion   01/19/2015 Breast MRI Right breast 5 x 6 x 3.8 cm lesion of non-masslike and nodular enhancement: Left breast 4.5x5x3.5 cm area of non-mass enhancement adjacent to complex sclerosing lesion extending 3.5 sinus lateral to biopsy, no lymph nodes   01/30/2015 Initial Biopsy Left Breast Biopsy: IDC with DCIS, ER 100%; PR 63%, Ki 67 13%; Her 2 Neg; Right: LOQ ER 51%, PR 45%    CHIEF COMPLIANT: follow-up to discuss the results of left breast biopsy  INTERVAL HISTORY: Grace Paul is a 80 year old lady with above-mentioned history of previous right-sided breast cancer that was triple negative and was now identified to have left breast abnormality on MRI and underwent a left breast biopsy of 01/30/2015. She is here today to discuss the results of the biopsy. The final pathology revealed that she had ER/PR positive breast cancer on the left side. Its clinical stage is T2 N0 M0.  She also had a biopsy of the right breast papillary lesion which came back as DCIS that was ER/PR positive.  REVIEW OF SYSTEMS:   Constitutional: Denies fevers, chills or abnormal weight loss Eyes: Denies blurriness of vision Ears, nose, mouth, throat, and face: Denies mucositis or sore throat Respiratory: Denies cough, dyspnea or wheezes Cardiovascular: Denies palpitation, chest discomfort or lower extremity swelling Gastrointestinal:  Denies nausea, heartburn or change in bowel habits Skin: Denies abnormal skin rashes Lymphatics: Denies new lymphadenopathy or easy bruising Neurological:Denies numbness, tingling or new weaknesses Behavioral/Psych: anxious and stressed All other systems were reviewed with the patient and are negative.  I have reviewed the past medical history, past surgical history, social history and family history with the patient and they are unchanged from previous note.  ALLERGIES:  is allergic to gentian violet; clarithromycin; cleocin; compazine; epinephrine; and hyoscyamine sulfate.  MEDICATIONS:  Current Outpatient Prescriptions  Medication Sig Dispense Refill  . acetaminophen (TYLENOL) 325 MG tablet Take 650 mg by mouth every 6 (six) hours as needed for mild pain or moderate pain.    Marland Kitchen amLODipine (NORVASC) 2.5 MG tablet Take 2.5 mg by mouth every morning. Patient monitors blood pressure so does not necessarily take every day  0  . Cyanocobalamin (VITAMIN B 12) 100 MCG LOZG Take 1 tablet by mouth daily. Patient does not take every day    . esomeprazole (NEXIUM) 40 MG capsule Take 40 mg by mouth daily as needed. For indigestion.    Marland Kitchen HYDROcodone-acetaminophen (NORCO) 5-325 MG per tablet Take 1-2 tablets by mouth every  6 (six) hours as needed. 30 tablet 0  . HYDROcodone-acetaminophen (NORCO/VICODIN) 5-325 MG per tablet Take 0.5-1 tablets by mouth every 6 (six) hours as needed for moderate pain.   0  . lidocaine-prilocaine (EMLA) cream Apply 1 application topically  as needed. 30 g 1  . LORazepam (ATIVAN) 1 MG tablet Take 0.5 tablets (0.5 mg total) by mouth 3 (three) times daily as needed for anxiety. 15 tablet 0  . meclizine (ANTIVERT) 12.5 MG tablet Take 12.5 mg by mouth 2 (two) times daily as needed for dizziness.   0  . metoprolol succinate (TOPROL-XL) 25 MG 24 hr tablet Take 12.5 mg by mouth 2 (two) times daily.     Marland Kitchen neomycin-polymyxin-hydrocortisone (CORTISPORIN) 3.5-10000-1 otic suspension Place 3 drops into both ears daily as needed (when ear bothers her).   0  . ondansetron (ZOFRAN) 8 MG tablet Take 1 tablet (8 mg total) by mouth every 8 (eight) hours as needed. 30 tablet 1  . prochlorperazine (COMPAZINE) 10 MG tablet Take 1 tablet (10 mg total) by mouth every 6 (six) hours as needed (Nausea or vomiting). 30 tablet 1  . temazepam (RESTORIL) 15 MG capsule Take 7.5 mg by mouth at bedtime. For sleep.     No current facility-administered medications for this visit.    PHYSICAL EXAMINATION: ECOG PERFORMANCE STATUS: 1 - Symptomatic but completely ambulatory  Filed Vitals:   02/15/15 0911  BP: 146/77  Pulse: 81  Temp: 97.8 F (36.6 C)  Resp: 18   Filed Weights   02/15/15 0911  Weight: 112 lb 12.8 oz (51.166 kg)    GENERAL:alert, no distress and comfortable SKIN: skin color, texture, turgor are normal, no rashes or significant lesions EYES: normal, Conjunctiva are pink and non-injected, sclera clear OROPHARYNX:no exudate, no erythema and lips, buccal mucosa, and tongue normal  NECK: supple, thyroid normal size, non-tender, without nodularity LYMPH:  no palpable lymphadenopathy in the cervical, axillary or inguinal LUNGS: clear to auscultation and percussion with normal breathing effort HEART: regular rate & rhythm and no murmurs and no lower extremity edema ABDOMEN:abdomen soft, non-tender and normal bowel sounds Musculoskeletal:no cyanosis of digits and no clubbing  NEURO: alert & oriented x 3 with fluent speech, no focal motor/sensory  deficits  LABORATORY DATA:  I have reviewed the data as listed   Chemistry      Component Value Date/Time   NA 142 01/24/2015 0838   K 3.9 01/24/2015 0838   CO2 24 01/24/2015 0838   BUN 15.2 01/24/2015 0838   CREATININE 0.70 02/07/2015 1831   CREATININE 0.9 01/24/2015 0838      Component Value Date/Time   CALCIUM 9.5 01/24/2015 0838   ALKPHOS 71 01/24/2015 0838   AST 21 01/24/2015 0838   ALT 18 01/24/2015 0838   BILITOT 0.35 01/24/2015 0838       Lab Results  Component Value Date   WBC 7.9 01/24/2015   HGB 13.8 01/24/2015   HCT 42.0 01/24/2015   MCV 89.0 01/24/2015   PLT 221 01/24/2015   NEUTROABS 5.7 01/24/2015    ASSESSMENT & PLAN:  Breast cancer of lower-inner quadrant of right female breast Right breast 5 x 6 x 3.8 cm lesion of non-masslike and nodular enhancement: Invasive ductal carcinoma with DCIS, grade 3, ER 0%, PR 0%, HER-2 negative ratio 1.16, Ki-67 83% Right Breast LOQ: 01/30/15: DCIS involving the papillary lesion ER 51%, PR 45% Left breast 4.5x5x3.5 cm area of non-mass enhancement adjacent to complex sclerosing lesion extending 3.5 cm IDC with  DCIS, ER 100%; PR 63%, Ki 67 13%; Her 2 Neg; Right: LOQ ER 51%, PR 45%  Pathology Counseling: Discussed the results of Left breast biopsy. She has a different cancer in left breast compared to the right.  Recommendation: 1. Neoadjuvant chemotherapy with weekly Taxol and carboplatin may be tolerable given her age. After conclusion of 12 weeks of Taxol and carboplatin will plan to do a breast MRI. If she does not have an adequate response then we may have to consider treatment with Adriamycin and Cytoxan. 2. Followed by surgery 3. Followed by radiation if needed 4. Followed by Anti estrogen therapy  RTC with start of chemo.    No orders of the defined types were placed in this encounter.   The patient has a good understanding of the overall plan. she agrees with it. She will call with any problems that may  develop before her next visit here.   Rulon Eisenmenger, MD

## 2015-02-15 NOTE — Telephone Encounter (Signed)
Called and spoke with patient to confirm her appointments for 5/3.

## 2015-02-19 ENCOUNTER — Other Ambulatory Visit: Payer: Self-pay

## 2015-02-19 DIAGNOSIS — C50311 Malignant neoplasm of lower-inner quadrant of right female breast: Secondary | ICD-10-CM

## 2015-02-19 NOTE — Assessment & Plan Note (Signed)
Breast cancer of lower-inner quadrant of right female breast Right breast 5 x 6 x 3.8 cm lesion of non-masslike and nodular enhancement: Invasive ductal carcinoma with DCIS, grade 3, ER 0%, PR 0%, HER-2 negative ratio 1.16, Ki-67 83% Right Breast LOQ: 01/30/15: DCIS involving the papillary lesion ER 51%, PR 45% Left breast 4.5x5x3.5 cm area of non-mass enhancement adjacent to complex sclerosing lesion extending 3.5 cm IDC with DCIS, ER 100%; PR 63%, Ki 67 13%; Her 2 Neg; Right: LOQ ER 51%, PR 45%  Treatment Plan: Neoadj taxol-carbo weekly x 12 if no adequate response, will do AC. Foll by surgery, XRT foll by Anti-estrogen therapy. Current Treatment: Cycle 1 Taxol- carboplatin  RTC 1 week

## 2015-02-20 ENCOUNTER — Telehealth: Payer: Self-pay | Admitting: Hematology and Oncology

## 2015-02-20 ENCOUNTER — Encounter: Payer: Self-pay | Admitting: General Practice

## 2015-02-20 ENCOUNTER — Ambulatory Visit (HOSPITAL_BASED_OUTPATIENT_CLINIC_OR_DEPARTMENT_OTHER): Payer: Medicare Other | Admitting: Hematology and Oncology

## 2015-02-20 ENCOUNTER — Encounter: Payer: Self-pay | Admitting: *Deleted

## 2015-02-20 ENCOUNTER — Ambulatory Visit (HOSPITAL_BASED_OUTPATIENT_CLINIC_OR_DEPARTMENT_OTHER): Payer: Medicare Other

## 2015-02-20 ENCOUNTER — Other Ambulatory Visit (HOSPITAL_BASED_OUTPATIENT_CLINIC_OR_DEPARTMENT_OTHER): Payer: Medicare Other

## 2015-02-20 VITALS — BP 150/72 | HR 80 | Temp 98.0°F | Resp 18 | Ht 60.0 in | Wt 113.0 lb

## 2015-02-20 VITALS — BP 134/55 | HR 76 | Resp 16

## 2015-02-20 DIAGNOSIS — C50412 Malignant neoplasm of upper-outer quadrant of left female breast: Secondary | ICD-10-CM | POA: Diagnosis present

## 2015-02-20 DIAGNOSIS — F410 Panic disorder [episodic paroxysmal anxiety] without agoraphobia: Secondary | ICD-10-CM

## 2015-02-20 DIAGNOSIS — F419 Anxiety disorder, unspecified: Secondary | ICD-10-CM

## 2015-02-20 DIAGNOSIS — Z5111 Encounter for antineoplastic chemotherapy: Secondary | ICD-10-CM

## 2015-02-20 DIAGNOSIS — C50311 Malignant neoplasm of lower-inner quadrant of right female breast: Secondary | ICD-10-CM

## 2015-02-20 LAB — CBC WITH DIFFERENTIAL/PLATELET
BASO%: 0.2 % (ref 0.0–2.0)
Basophils Absolute: 0 10*3/uL (ref 0.0–0.1)
EOS%: 1.1 % (ref 0.0–7.0)
Eosinophils Absolute: 0.1 10*3/uL (ref 0.0–0.5)
HCT: 39.9 % (ref 34.8–46.6)
HEMOGLOBIN: 13.5 g/dL (ref 11.6–15.9)
LYMPH%: 14.6 % (ref 14.0–49.7)
MCH: 29.6 pg (ref 25.1–34.0)
MCHC: 33.8 g/dL (ref 31.5–36.0)
MCV: 87.5 fL (ref 79.5–101.0)
MONO#: 0.5 10*3/uL (ref 0.1–0.9)
MONO%: 5.4 % (ref 0.0–14.0)
NEUT#: 7.3 10*3/uL — ABNORMAL HIGH (ref 1.5–6.5)
NEUT%: 78.7 % — ABNORMAL HIGH (ref 38.4–76.8)
Platelets: 260 10*3/uL (ref 145–400)
RBC: 4.56 10*6/uL (ref 3.70–5.45)
RDW: 13.9 % (ref 11.2–14.5)
WBC: 9.2 10*3/uL (ref 3.9–10.3)
lymph#: 1.4 10*3/uL (ref 0.9–3.3)

## 2015-02-20 LAB — COMPREHENSIVE METABOLIC PANEL (CC13)
ALK PHOS: 85 U/L (ref 40–150)
ALT: 28 U/L (ref 0–55)
AST: 14 U/L (ref 5–34)
Albumin: 4.2 g/dL (ref 3.5–5.0)
Anion Gap: 12 mEq/L — ABNORMAL HIGH (ref 3–11)
BUN: 11.9 mg/dL (ref 7.0–26.0)
CO2: 20 mEq/L — ABNORMAL LOW (ref 22–29)
Calcium: 9.9 mg/dL (ref 8.4–10.4)
Chloride: 108 mEq/L (ref 98–109)
Creatinine: 0.8 mg/dL (ref 0.6–1.1)
EGFR: 70 mL/min/{1.73_m2} — ABNORMAL LOW (ref >90)
Glucose: 108 mg/dl (ref 70–140)
Potassium: 3.8 mEq/L (ref 3.5–5.1)
SODIUM: 140 meq/L (ref 136–145)
TOTAL PROTEIN: 7.2 g/dL (ref 6.4–8.3)
Total Bilirubin: 0.35 mg/dL (ref 0.20–1.20)

## 2015-02-20 MED ORDER — SODIUM CHLORIDE 0.9 % IJ SOLN
10.0000 mL | INTRAMUSCULAR | Status: DC | PRN
  Administered 2015-02-20: 10 mL
  Filled 2015-02-20: qty 10

## 2015-02-20 MED ORDER — SODIUM CHLORIDE 0.9 % IV SOLN
Freq: Once | INTRAVENOUS | Status: AC
  Administered 2015-02-20: 12:00:00 via INTRAVENOUS

## 2015-02-20 MED ORDER — PACLITAXEL CHEMO INJECTION 300 MG/50ML
80.0000 mg/m2 | Freq: Once | INTRAVENOUS | Status: AC
  Administered 2015-02-20: 120 mg via INTRAVENOUS
  Filled 2015-02-20: qty 20

## 2015-02-20 MED ORDER — FAMOTIDINE IN NACL 20-0.9 MG/50ML-% IV SOLN
INTRAVENOUS | Status: AC
  Filled 2015-02-20: qty 50

## 2015-02-20 MED ORDER — DIPHENHYDRAMINE HCL 50 MG/ML IJ SOLN
INTRAMUSCULAR | Status: AC
Start: 2015-02-20 — End: 2015-02-20
  Filled 2015-02-20: qty 1

## 2015-02-20 MED ORDER — SODIUM CHLORIDE 0.9 % IV SOLN
Freq: Once | INTRAVENOUS | Status: AC
  Administered 2015-02-20: 13:00:00 via INTRAVENOUS
  Filled 2015-02-20: qty 8

## 2015-02-20 MED ORDER — FAMOTIDINE IN NACL 20-0.9 MG/50ML-% IV SOLN
20.0000 mg | Freq: Once | INTRAVENOUS | Status: AC
  Administered 2015-02-20: 20 mg via INTRAVENOUS

## 2015-02-20 MED ORDER — DIPHENHYDRAMINE HCL 50 MG/ML IJ SOLN
50.0000 mg | Freq: Once | INTRAMUSCULAR | Status: AC
  Administered 2015-02-20: 50 mg via INTRAVENOUS

## 2015-02-20 MED ORDER — SODIUM CHLORIDE 0.9 % IV SOLN
130.0000 mg | Freq: Once | INTRAVENOUS | Status: AC
  Administered 2015-02-20: 130 mg via INTRAVENOUS
  Filled 2015-02-20: qty 13

## 2015-02-20 MED ORDER — HEPARIN SOD (PORK) LOCK FLUSH 100 UNIT/ML IV SOLN
500.0000 [IU] | Freq: Once | INTRAVENOUS | Status: AC | PRN
  Administered 2015-02-20: 500 [IU]
  Filled 2015-02-20: qty 5
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
## 2015-02-20 NOTE — Telephone Encounter (Signed)
Gave asv & calendar for May.
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
## 2015-02-20 NOTE — Patient Instructions (Signed)
[facility] Discharge Instructions for Patients Receiving Chemotherapy  Today you received the following chemotherapy agents: Taxol, Carboplatin  To help prevent nausea and vomiting after your treatment, we encourage you to take your nausea medication as prescribed by your physician.   If you develop nausea and vomiting that is not controlled by your nausea medication, call the clinic.   BELOW ARE SYMPTOMS THAT SHOULD BE REPORTED IMMEDIATELY:  *FEVER GREATER THAN 100.5 F  *CHILLS WITH OR WITHOUT FEVER  NAUSEA AND VOMITING THAT IS NOT CONTROLLED WITH YOUR NAUSEA MEDICATION  *UNUSUAL SHORTNESS OF BREATH  *UNUSUAL BRUISING OR BLEEDING  TENDERNESS IN MOUTH AND THROAT WITH OR WITHOUT PRESENCE OF ULCERS  *URINARY PROBLEMS  *BOWEL PROBLEMS  UNUSUAL RASH Items with * indicate a potential emergency and should be followed up as soon as possible.  Feel free to call the clinic you have any questions or concerns. The clinic phone number is [phone].  Please show the CHEMO ALERT CARD at check-in to the Emergency Department and triage nurse.   

## 2015-02-20 NOTE — Progress Notes (Signed)
During infusion, pt made several comments regarding being anxious, "I have mental issues, I need a shrink.  I don't like to be alone, and I need someone to talk to".  Lattie Haw, Chaplain, called and was of great help for pt. Pt was able to voice concerns and vent to chaplain, and verbalized "I don't feel as anxious as before". Pt tolerated 1st taxol/carbo well without difficulty or reaction. Pt discharged ambulatory with Lattie Haw outside to [place] bus.

## 2015-02-20 NOTE — Progress Notes (Signed)
Patient Care Team: Dixie Dials, MD as PCP - General (Cardiology) [name], MD as Consulting Physician (General Surgery) Nicholas Lose, MD as Consulting Physician (Hematology and Oncology) Arloa Koh, MD as Consulting Physician (Radiation Oncology) Rockwell Germany, RN as Registered Nurse Mauro Kaufmann, RN as Registered Nurse Holley Bouche, NP as Nurse Practitioner (Nurse Practitioner)  DIAGNOSIS: Breast cancer of lower-inner quadrant of right female breast   Staging form: Breast, AJCC 7th Edition     Clinical stage from 01/24/2015: Stage IIA (T2, N0, M0) - Unsigned   SUMMARY OF ONCOLOGIC HISTORY:   Breast cancer of lower-inner quadrant of right female breast   01/10/2015 Imaging Ultrasound breast: Right breast 4:00: 2.3 cm lesion, at 4:00 6 cm from nipple to 0.4 cm hypoechoic mass, left breast 1130; 2 cm from nipple 0.4 cm mass   01/12/2015 Initial Diagnosis Right breast biopsy: Invasive ductal carcinoma with DCIS, grade 3, ER 0%, PR 0%, HER-2 negative ratio 1.16, Ki-67 83%; left breast biopsy complex sclerosing lesion   01/19/2015 Breast MRI Right breast 5 x 6 x 3.8 cm lesion of non-masslike and nodular enhancement: Left breast 4.5x5x3.5 cm area of non-mass enhancement adjacent to complex sclerosing lesion extending 3.5 sinus lateral to biopsy, no lymph nodes   01/30/2015 Initial Biopsy Left Breast Biopsy: IDC with DCIS, ER 100%; PR 63%, Ki 67 13%; Her 2 Neg; Right: LOQ ER 51%, PR 45%   02/20/2015 -  Neo-Adjuvant Chemotherapy Neoadjuvant Taxol and carboplatin weekly 12    CHIEF COMPLIANT: Cycle 1/12 Taxol and carboplatin weekly  INTERVAL HISTORY: Grace Paul is a 80 year old lady with above-mentioned history of left and right breast cancers. She has triple negative disease in the right breast and estrogen-positive disease in the left breast. She is here today to start neoadjuvant chemotherapy. We have decided to go Taxol and carboplatin when we knew that she had triple negative  disease and we knew that she cannot tolerate heavy dose chemotherapy. But later we last that she does have estrogen positive breast cancer as well as estrogen positive DCIS. We did not decide to change her chemotherapy regimen because I do not believe that her age and performance status that she could tolerate much more stronger treatments. She is very distraught today emotionally. She expressed severe anxiety attacks for which she is now taking Ativan as needed.   REVIEW OF SYSTEMS:   Constitutional: Denies fevers, chills or abnormal weight loss Eyes: Denies blurriness of vision Ears, nose, mouth, throat, and face: Denies mucositis or sore throat Respiratory: Denies cough, dyspnea or wheezes Cardiovascular: Denies palpitation, chest discomfort or lower extremity swelling Gastrointestinal:  Denies nausea, heartburn or change in bowel habits Skin: Denies abnormal skin rashes Lymphatics: Denies new lymphadenopathy or easy bruising Neurological:Denies numbness, tingling or new weaknesses Behavioral/Psych: Severe anxiety   All other systems were reviewed with the patient and are negative.  I have reviewed the past medical history, past surgical history, social history and family history with the patient and they are unchanged from previous note.  ALLERGIES:  is allergic to gentian violet; clarithromycin; cleocin; compazine; epinephrine; and hyoscyamine sulfate.  MEDICATIONS:  Current Outpatient Prescriptions  Medication Sig Dispense Refill  . acetaminophen (TYLENOL) 325 MG tablet Take 650 mg by mouth every 6 (six) hours as needed for mild pain or moderate pain.    Marland Kitchen amLODipine (NORVASC) 2.5 MG tablet Take 2.5 mg by mouth every morning. Patient monitors blood pressure so does not necessarily take every day  0  .  Cyanocobalamin (VITAMIN B 12) 100 MCG LOZG Take 1 tablet by mouth daily. Patient does not take every day    . esomeprazole (NEXIUM) 40 MG capsule Take 40 mg by mouth daily as needed.  For indigestion.    Marland Kitchen HYDROcodone-acetaminophen (NORCO) 5-325 MG per tablet Take 1-2 tablets by mouth every 6 (six) hours as needed. 30 tablet 0  . lidocaine-prilocaine (EMLA) cream Apply 1 application topically as needed. 30 g 1  . LORazepam (ATIVAN) 1 MG tablet Take 0.5 tablets (0.5 mg total) by mouth 3 (three) times daily as needed for anxiety. 30 tablet 0  . meclizine (ANTIVERT) 12.5 MG tablet Take 12.5 mg by mouth 2 (two) times daily as needed for dizziness.   0  . metoprolol succinate (TOPROL-XL) 25 MG 24 hr tablet Take 12.5 mg by mouth 2 (two) times daily.     Marland Kitchen neomycin-polymyxin-hydrocortisone (CORTISPORIN) 3.5-10000-1 otic suspension Place 3 drops into both ears daily as needed (when ear bothers her).   0  . ondansetron (ZOFRAN) 8 MG tablet Take 1 tablet (8 mg total) by mouth every 8 (eight) hours as needed. 30 tablet 1  . prochlorperazine (COMPAZINE) 10 MG tablet Take 1 tablet (10 mg total) by mouth every 6 (six) hours as needed (Nausea or vomiting). 30 tablet 1  . temazepam (RESTORIL) 15 MG capsule Take 7.5 mg by mouth at bedtime. For sleep.     No current facility-administered medications for this visit.    PHYSICAL EXAMINATION: ECOG PERFORMANCE STATUS: 1 - Symptomatic but completely ambulatory  Filed Vitals:   02/20/15 1024  BP: 150/72  Pulse: 80  Temp: 98 F (36.7 C)  Resp: 18   Filed Weights   02/20/15 1024  Weight: 113 lb (51.256 kg)    GENERAL:alert, no distress and comfortable SKIN: skin color, texture, turgor are normal, no rashes or significant lesions EYES: normal, Conjunctiva are pink and non-injected, sclera clear OROPHARYNX:no exudate, no erythema and lips, buccal mucosa, and tongue normal  NECK: supple, thyroid normal size, non-tender, without nodularity LYMPH:  no palpable lymphadenopathy in the cervical, axillary or inguinal LUNGS: clear to auscultation and percussion with normal breathing effort HEART: regular rate & rhythm and no murmurs and no lower  extremity edema ABDOMEN:abdomen soft, non-tender and normal bowel sounds Musculoskeletal:no cyanosis of digits and no clubbing  NEURO: alert & oriented x 3 with fluent speech, no focal motor/sensory deficits  LABORATORY DATA:  I have reviewed the data as listed   Chemistry      Component Value Date/Time   NA 140 02/20/2015 0959   K 3.8 02/20/2015 0959   CO2 20* 02/20/2015 0959   BUN 11.9 02/20/2015 0959   CREATININE 0.8 02/20/2015 0959   CREATININE 0.70 02/07/2015 1831      Component Value Date/Time   CALCIUM 9.9 02/20/2015 0959   ALKPHOS 85 02/20/2015 0959   AST 14 02/20/2015 0959   ALT 28 02/20/2015 0959   BILITOT 0.35 02/20/2015 0959       Lab Results  Component Value Date   WBC 9.2 02/20/2015   HGB 13.5 02/20/2015   HCT 39.9 02/20/2015   MCV 87.5 02/20/2015   PLT 260 02/20/2015   NEUTROABS 7.3* 02/20/2015    ASSESSMENT & PLAN:  Breast cancer of lower-inner quadrant of right female breast Breast cancer of lower-inner quadrant of right female breast Right breast 5 x 6 x 3.8 cm lesion of non-masslike and nodular enhancement: Invasive ductal carcinoma with DCIS, grade 3, ER 0%, PR 0%, HER-2  negative ratio 1.16, Ki-67 83% Right Breast LOQ: 01/30/15: DCIS involving the papillary lesion ER 51%, PR 45% Left breast 4.5x5x3.5 cm area of non-mass enhancement adjacent to complex sclerosing lesion extending 3.5 cm IDC with DCIS, ER 100%; PR 63%, Ki 67 13%; Her 2 Neg; Right: LOQ ER 51%, PR 45%  Treatment Plan: Neoadj taxol-carbo weekly x 12 if no adequate response, will do AC. Foll by surgery, XRT foll by Anti-estrogen therapy. Current Treatment: Cycle 1 Taxol- carboplatin  Severe anxiety and panic attacks: Patient has been experiencing severe anxiety with regards to her friend who has been very supportive of her for the last 4 years. She does not understand why she has these feelings. 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 She is getting anxious because he has been around with her for the last 4 years. We are going to refer her to psychiatry for counseling and therapy. For now she has Ativan prescription. I instruct her to take half a tablet when she gets panic attacks.  RTC 1 week   No orders of the defined types were placed in this encounter.   The patient has a good understanding of the overall plan. she agrees with it. she will call with any problems that may develop before the next visit here.   Rulon Eisenmenger, MD

## 2015-02-21 ENCOUNTER — Telehealth: Payer: Self-pay | Admitting: *Deleted

## 2015-02-21 ENCOUNTER — Telehealth: Payer: Self-pay

## 2015-02-21 NOTE — Telephone Encounter (Signed)
Spoke to patient and she states she is doing well, but she reports that she is having some burning with urination.  She is going to call her urologist to be seen and evaluated.  Otherwise she feels ok.  Informed I would call her on Friday to verify her appointments for next week.

## 2015-02-21 NOTE — Telephone Encounter (Signed)
Iris Pert spoke with patient twice so far today - chemo follow up call.n

## 2015-02-21 NOTE — Telephone Encounter (Signed)
-----   Message from Adalberto Cole, RN sent at 02/20/2015  3:38 PM EDT ----- Regarding: Dr. Lindi Adie, chemo follow up 1st taxol/carbo

## 2015-02-26 ENCOUNTER — Other Ambulatory Visit: Payer: Self-pay | Admitting: *Deleted

## 2015-02-26 DIAGNOSIS — C50311 Malignant neoplasm of lower-inner quadrant of right female breast: Secondary | ICD-10-CM

## 2015-02-27 ENCOUNTER — Telehealth: Payer: Self-pay | Admitting: Hematology and Oncology

## 2015-02-27 ENCOUNTER — Encounter: Payer: Self-pay | Admitting: *Deleted

## 2015-02-27 ENCOUNTER — Ambulatory Visit (HOSPITAL_BASED_OUTPATIENT_CLINIC_OR_DEPARTMENT_OTHER): Payer: Medicare Other | Admitting: Hematology and Oncology

## 2015-02-27 ENCOUNTER — Other Ambulatory Visit (HOSPITAL_BASED_OUTPATIENT_CLINIC_OR_DEPARTMENT_OTHER): Payer: Medicare Other

## 2015-02-27 ENCOUNTER — Ambulatory Visit (HOSPITAL_BASED_OUTPATIENT_CLINIC_OR_DEPARTMENT_OTHER): Payer: Medicare Other

## 2015-02-27 VITALS — BP 112/67 | HR 76 | Temp 98.2°F | Resp 18 | Ht 60.0 in | Wt 111.3 lb

## 2015-02-27 DIAGNOSIS — C50912 Malignant neoplasm of unspecified site of left female breast: Secondary | ICD-10-CM | POA: Diagnosis not present

## 2015-02-27 DIAGNOSIS — C50311 Malignant neoplasm of lower-inner quadrant of right female breast: Secondary | ICD-10-CM

## 2015-02-27 DIAGNOSIS — Z5112 Encounter for antineoplastic immunotherapy: Secondary | ICD-10-CM | POA: Diagnosis not present

## 2015-02-27 DIAGNOSIS — F418 Other specified anxiety disorders: Secondary | ICD-10-CM

## 2015-02-27 LAB — CBC WITH DIFFERENTIAL/PLATELET
BASO%: 1 % (ref 0.0–2.0)
BASOS ABS: 0.1 10*3/uL (ref 0.0–0.1)
EOS ABS: 0.1 10*3/uL (ref 0.0–0.5)
EOS%: 1.3 % (ref 0.0–7.0)
HCT: 38.6 % (ref 34.8–46.6)
HGB: 13 g/dL (ref 11.6–15.9)
LYMPH%: 17.7 % (ref 14.0–49.7)
MCH: 29.3 pg (ref 25.1–34.0)
MCHC: 33.6 g/dL (ref 31.5–36.0)
MCV: 87.1 fL (ref 79.5–101.0)
MONO#: 0.4 10*3/uL (ref 0.1–0.9)
MONO%: 5.3 % (ref 0.0–14.0)
NEUT#: 5.8 10*3/uL (ref 1.5–6.5)
NEUT%: 74.7 % (ref 38.4–76.8)
PLATELETS: 274 10*3/uL (ref 145–400)
RBC: 4.44 10*6/uL (ref 3.70–5.45)
RDW: 14 % (ref 11.2–14.5)
WBC: 7.7 10*3/uL (ref 3.9–10.3)
lymph#: 1.4 10*3/uL (ref 0.9–3.3)

## 2015-02-27 LAB — COMPREHENSIVE METABOLIC PANEL (CC13)
ALBUMIN: 4.1 g/dL (ref 3.5–5.0)
ALK PHOS: 70 U/L (ref 40–150)
ALT: 21 U/L (ref 0–55)
AST: 17 U/L (ref 5–34)
Anion Gap: 13 mEq/L — ABNORMAL HIGH (ref 3–11)
BILIRUBIN TOTAL: 0.32 mg/dL (ref 0.20–1.20)
BUN: 14.2 mg/dL (ref 7.0–26.0)
CO2: 21 mEq/L — ABNORMAL LOW (ref 22–29)
Calcium: 9.1 mg/dL (ref 8.4–10.4)
Chloride: 104 mEq/L (ref 98–109)
Creatinine: 1 mg/dL (ref 0.6–1.1)
EGFR: 57 mL/min/{1.73_m2} — ABNORMAL LOW (ref >90)
Glucose: 106 mg/dl (ref 70–140)
POTASSIUM: 4.3 meq/L (ref 3.5–5.1)
SODIUM: 138 meq/L (ref 136–145)
TOTAL PROTEIN: 6.7 g/dL (ref 6.4–8.3)

## 2015-02-27 MED ORDER — SODIUM CHLORIDE 0.9 % IJ SOLN
10.0000 mL | INTRAMUSCULAR | Status: DC | PRN
  Administered 2015-02-27: 10 mL
  Filled 2015-02-27: qty 10

## 2015-02-27 MED ORDER — PALONOSETRON HCL INJECTION 0.25 MG/5ML
0.2500 mg | Freq: Once | INTRAVENOUS | Status: AC
  Administered 2015-02-27: 0.25 mg via INTRAVENOUS

## 2015-02-27 MED ORDER — PACLITAXEL CHEMO INJECTION 300 MG/50ML
80.0000 mg/m2 | Freq: Once | INTRAVENOUS | Status: AC
  Administered 2015-02-27: 120 mg via INTRAVENOUS
  Filled 2015-02-27: qty 20

## 2015-02-27 MED ORDER — HEPARIN SOD (PORK) LOCK FLUSH 100 UNIT/ML IV SOLN
500.0000 [IU] | Freq: Once | INTRAVENOUS | Status: AC | PRN
Start: 2015-02-27 — End: 2015-02-27
  Administered 2015-02-27: 500 [IU]
  Filled 2015-02-27: qty 5

## 2015-02-27 MED ORDER — SODIUM CHLORIDE 0.9 % IV SOLN
Freq: Once | INTRAVENOUS | Status: AC
  Administered 2015-02-27: 12:00:00 via INTRAVENOUS
  Filled 2015-02-27: qty 1

## 2015-02-27 MED ORDER — DIPHENHYDRAMINE HCL 50 MG/ML IJ SOLN
INTRAMUSCULAR | Status: AC
  Filled 2015-02-27: qty 1

## 2015-02-27 MED ORDER — DIPHENHYDRAMINE HCL 50 MG/ML IJ SOLN
25.0000 mg | Freq: Once | INTRAMUSCULAR | Status: AC
  Administered 2015-02-27: 25 mg via INTRAVENOUS

## 2015-02-27 MED ORDER — FAMOTIDINE IN NACL 20-0.9 MG/50ML-% IV SOLN
20.0000 mg | Freq: Once | INTRAVENOUS | Status: AC
  Administered 2015-02-27: 20 mg via INTRAVENOUS

## 2015-02-27 MED ORDER — FAMOTIDINE IN NACL 20-0.9 MG/50ML-% IV SOLN
INTRAVENOUS | Status: AC
  Filled 2015-02-27: qty 50

## 2015-02-27 MED ORDER — SODIUM CHLORIDE 0.9 % IV SOLN
130.0000 mg | Freq: Once | INTRAVENOUS | Status: AC
  Administered 2015-02-27: 130 mg via INTRAVENOUS
  Filled 2015-02-27: qty 13

## 2015-02-27 MED ORDER — SODIUM CHLORIDE 0.9 % IV SOLN
Freq: Once | INTRAVENOUS | Status: AC
  Administered 2015-02-27: 12:00:00 via INTRAVENOUS

## 2015-02-27 MED ORDER — PALONOSETRON HCL INJECTION 0.25 MG/5ML
INTRAVENOUS | Status: AC
  Filled 2015-02-27: qty 5

## 2015-02-27 NOTE — Progress Notes (Unsigned)
Spoke with patient today at her visit with Dr. Lindi Adie and her 2nd cycle chemo.  She still is very anxious and continues to ask for psychiatric help. She does have an appointment but not until June 1.  Sat with patient until she saw Dr. Lindi Adie because she stated she was disturbed from a conversation that she overheard in the lobby which made her very anxious and was ready to leave without getting her treatment.  Was able to give her emotional support and relieved some of her anxiety.  Will continue to follow.

## 2015-02-27 NOTE — Telephone Encounter (Signed)
Appointments made and avs will be printed in chemo  °

## 2015-02-27 NOTE — Assessment & Plan Note (Signed)
Breast cancer of lower-inner quadrant of right female breast Right breast 5 x 6 x 3.8 cm lesion of non-masslike and nodular enhancement: Invasive ductal carcinoma with DCIS, grade 3, ER 0%, PR 0%, HER-2 negative ratio 1.16, Ki-67 83% Right Breast LOQ: 01/30/15: DCIS involving the papillary lesion ER 51%, PR 45% Left breast 4.5x5x3.5 cm area of non-mass enhancement adjacent to complex sclerosing lesion extending 3.5 cm IDC with DCIS, ER 100%; PR 63%, Ki 67 13%; Her 2 Neg; Right: LOQ ER 51%, PR 45%  Treatment Plan: Neoadj taxol-carbo weekly x 12 if no adequate response, will do AC. Foll by surgery, XRT foll by Anti-estrogen therapy. Current Treatment: Cycle 2/12 Taxol- carboplatin weekly Chemotherapy toxicities:  RTC 2 weeks   

## 2015-02-27 NOTE — Progress Notes (Signed)
Patient Care Team: Dixie Dials, MD as PCP - General (Cardiology) [name], MD as Consulting Physician (General Surgery) Nicholas Lose, MD as Consulting Physician (Hematology and Oncology) Arloa Koh, MD as Consulting Physician (Radiation Oncology) Rockwell Germany, RN as Registered Nurse Mauro Kaufmann, RN as Registered Nurse Holley Bouche, NP as Nurse Practitioner (Nurse Practitioner)  DIAGNOSIS: Breast cancer of lower-inner quadrant of right female breast   Staging form: Breast, AJCC 7th Edition     Clinical stage from 01/24/2015: Stage IIA (T2, N0, M0) - Unsigned   SUMMARY OF ONCOLOGIC HISTORY:   Breast cancer of lower-inner quadrant of right female breast   01/10/2015 Imaging Ultrasound breast: Right breast 4:00: 2.3 cm lesion, at 4:00 6 cm from nipple to 0.4 cm hypoechoic mass, left breast 1130; 2 cm from nipple 0.4 cm mass   01/12/2015 Initial Diagnosis Right breast biopsy: Invasive ductal carcinoma with DCIS, grade 3, ER 0%, PR 0%, HER-2 negative ratio 1.16, Ki-67 83%; left breast biopsy complex sclerosing lesion   01/19/2015 Breast MRI Right breast 5 x 6 x 3.8 cm lesion of non-masslike and nodular enhancement: Left breast 4.5x5x3.5 cm area of non-mass enhancement adjacent to complex sclerosing lesion extending 3.5 sinus lateral to biopsy, no lymph nodes   01/30/2015 Initial Biopsy Left Breast Biopsy: IDC with DCIS, ER 100%; PR 63%, Ki 67 13%; Her 2 Neg; Right: LOQ ER 51%, PR 45%   02/20/2015 -  Neo-Adjuvant Chemotherapy Neoadjuvant Taxol and carboplatin weekly 12    CHIEF COMPLIANT:  Week 2/12 Taxol and carboplatin  INTERVAL HISTORY: Grace Paul is a  80 year old lady with above-mentioned history of bilateral breast cancers is currently on weekly Taxol and carboplatin. Today is week 2 of treatment.  She had tolerated cycle 1 of chemotherapy extremely well.  Mild nausea but no vomiting.  denied diarrhea or constipation. Did not have any fevers or chills. She has severe emotional  psychological problems.  REVIEW OF SYSTEMS:   Constitutional: Denies fevers, chills or abnormal weight loss Eyes: Denies blurriness of vision Ears, nose, mouth, throat, and face: Denies mucositis or sore throat Respiratory: Denies cough, dyspnea or wheezes Cardiovascular: Denies palpitation, chest discomfort or lower extremity swelling Gastrointestinal:   Mild nausea Skin: Denies abnormal skin rashes Lymphatics: Denies new lymphadenopathy or easy bruising Neurological:Denies numbness, tingling or new weaknesses Behavioral/Psych:  Severe anxiety and depression   All other systems were reviewed with the patient and are negative.  I have reviewed the past medical history, past surgical history, social history and family history with the patient and they are unchanged from previous note.  ALLERGIES:  is allergic to gentian violet; clarithromycin; cleocin; compazine; epinephrine; and hyoscyamine sulfate.  MEDICATIONS:  Current Outpatient Prescriptions  Medication Sig Dispense Refill  . acetaminophen (TYLENOL) 325 MG tablet Take 650 mg by mouth every 6 (six) hours as needed for mild pain or moderate pain.    Marland Kitchen amLODipine (NORVASC) 2.5 MG tablet Take 2.5 mg by mouth every morning. Patient monitors blood pressure so does not necessarily take every day  0  . cephALEXin (KEFLEX) 250 MG capsule   0  . esomeprazole (NEXIUM) 40 MG capsule Take 40 mg by mouth daily as needed. For indigestion.    . lidocaine-prilocaine (EMLA) cream Apply 1 application topically as needed. 30 g 1  . LORazepam (ATIVAN) 1 MG tablet Take 0.5 tablets (0.5 mg total) by mouth 3 (three) times daily as needed for anxiety. 30 tablet 0  . metoprolol succinate (TOPROL-XL) 25 MG  24 hr tablet Take 12.5 mg by mouth 2 (two) times daily.     Marland Kitchen neomycin-polymyxin-hydrocortisone (CORTISPORIN) 3.5-10000-1 otic suspension Place 3 drops into both ears daily as needed (when ear bothers her).   0  . ondansetron (ZOFRAN) 8 MG tablet Take 1  tablet (8 mg total) by mouth every 8 (eight) hours as needed. 30 tablet 1  . temazepam (RESTORIL) 15 MG capsule Take 7.5 mg by mouth at bedtime. For sleep.    . Cyanocobalamin (VITAMIN B 12) 100 MCG LOZG Take 1 tablet by mouth daily. Patient does not take every day    . HYDROcodone-acetaminophen (NORCO) 5-325 MG per tablet Take 1-2 tablets by mouth every 6 (six) hours as needed. (Patient not taking: Reported on 02/27/2015) 30 tablet 0  . meclizine (ANTIVERT) 12.5 MG tablet Take 12.5 mg by mouth 2 (two) times daily as needed for dizziness.   0  . prochlorperazine (COMPAZINE) 10 MG tablet Take 1 tablet (10 mg total) by mouth every 6 (six) hours as needed (Nausea or vomiting). (Patient not taking: Reported on 02/27/2015) 30 tablet 1   No current facility-administered medications for this visit.    PHYSICAL EXAMINATION: ECOG PERFORMANCE STATUS: 1 - Symptomatic but completely ambulatory  Filed Vitals:   02/27/15 1041  BP: 112/67  Pulse: 76  Temp: 98.2 F (36.8 C)  Resp: 18   Filed Weights   02/27/15 1041  Weight: 111 lb 4.8 oz (50.485 kg)    GENERAL:alert, no distress and comfortable SKIN: skin color, texture, turgor are normal, no rashes or significant lesions EYES: normal, Conjunctiva are pink and non-injected, sclera clear OROPHARYNX:no exudate, no erythema and lips, buccal mucosa, and tongue normal  NECK: supple, thyroid normal size, non-tender, without nodularity LYMPH:  no palpable lymphadenopathy in the cervical, axillary or inguinal LUNGS: clear to auscultation and percussion with normal breathing effort HEART: regular rate & rhythm and no murmurs and no lower extremity edema ABDOMEN:abdomen soft, non-tender and normal bowel sounds Musculoskeletal:no cyanosis of digits and no clubbing  NEURO: alert & oriented x 3 with fluent speech, no focal motor/sensory deficits  LABORATORY DATA:  I have reviewed the data as listed   Chemistry      Component Value Date/Time   NA 138  02/27/2015 1015   K 4.3 02/27/2015 1015   CO2 21* 02/27/2015 1015   BUN 14.2 02/27/2015 1015   CREATININE 1.0 02/27/2015 1015   CREATININE 0.70 02/07/2015 1831      Component Value Date/Time   CALCIUM 9.1 02/27/2015 1015   ALKPHOS 70 02/27/2015 1015   AST 17 02/27/2015 1015   ALT 21 02/27/2015 1015   BILITOT 0.32 02/27/2015 1015       Lab Results  Component Value Date   WBC 7.7 02/27/2015   HGB 13.0 02/27/2015   HCT 38.6 02/27/2015   MCV 87.1 02/27/2015   PLT 274 02/27/2015   NEUTROABS 5.8 02/27/2015   ASSESSMENT & PLAN:  Breast cancer of lower-inner quadrant of right female breast  Zoloft things alone for the next patient's she probably is being to her whole life story Right breast 5 x 6 x 3.8 cm lesion of non-masslike and nodular enhancement: Invasive ductal carcinoma with DCIS, grade 3, ER 0%, PR 0%, HER-2 negative ratio 1.16, Ki-67 83% Right Breast LOQ: 01/30/15: DCIS involving the papillary lesion ER 51%, PR 45% Left breast 4.5x5x3.5 cm area of non-mass enhancement adjacent to complex sclerosing lesion extending 3.5 cm IDC with DCIS, ER 100%; PR 63%, Ki 67  13%; Her 2 Neg; Right: LOQ ER 51%, PR 45%  Treatment Plan: Neoadj taxol-carbo weekly x 12 if no adequate response, will do AC. Foll by surgery, XRT foll by Anti-estrogen therapy. Current Treatment: Cycle 2/12 Taxol- carboplatin weekly Chemotherapy toxicities: 1.  Queasiness in the stomach : I will change her antiemetic from Zofran to Aloxi 2.  Immobility related to Benadryl: Decrease the dosage to 25 mg 3.  Severe anxiety /depression: we will try to arrange a consultation with psychiatrist. She has an appointment on June 1 but we would like to move it up if possible.  RTC 2 weeks  No orders of the defined types were placed in this encounter.   The patient has a good understanding of the overall plan. she agrees with it. she will call with any problems that may develop before the next visit here.   Rulon Eisenmenger,  MD

## 2015-03-05 ENCOUNTER — Other Ambulatory Visit: Payer: Self-pay

## 2015-03-05 DIAGNOSIS — C50311 Malignant neoplasm of lower-inner quadrant of right female breast: Secondary | ICD-10-CM

## 2015-03-06 ENCOUNTER — Other Ambulatory Visit (HOSPITAL_BASED_OUTPATIENT_CLINIC_OR_DEPARTMENT_OTHER): Payer: Medicare Other

## 2015-03-06 ENCOUNTER — Ambulatory Visit (HOSPITAL_BASED_OUTPATIENT_CLINIC_OR_DEPARTMENT_OTHER): Payer: Medicare Other

## 2015-03-06 VITALS — BP 150/66 | HR 80 | Temp 98.5°F

## 2015-03-06 DIAGNOSIS — C50311 Malignant neoplasm of lower-inner quadrant of right female breast: Secondary | ICD-10-CM | POA: Diagnosis not present

## 2015-03-06 DIAGNOSIS — Z5111 Encounter for antineoplastic chemotherapy: Secondary | ICD-10-CM | POA: Diagnosis not present

## 2015-03-06 LAB — CBC WITH DIFFERENTIAL/PLATELET
BASO%: 0.5 % (ref 0.0–2.0)
BASOS ABS: 0 10*3/uL (ref 0.0–0.1)
EOS ABS: 0.1 10*3/uL (ref 0.0–0.5)
EOS%: 0.9 % (ref 0.0–7.0)
HCT: 38.2 % (ref 34.8–46.6)
HGB: 12.9 g/dL (ref 11.6–15.9)
LYMPH%: 18 % (ref 14.0–49.7)
MCH: 29.4 pg (ref 25.1–34.0)
MCHC: 33.8 g/dL (ref 31.5–36.0)
MCV: 87.1 fL (ref 79.5–101.0)
MONO#: 0.4 10*3/uL (ref 0.1–0.9)
MONO%: 5.8 % (ref 0.0–14.0)
NEUT#: 5 10*3/uL (ref 1.5–6.5)
NEUT%: 74.8 % (ref 38.4–76.8)
Platelets: 241 10*3/uL (ref 145–400)
RBC: 4.38 10*6/uL (ref 3.70–5.45)
RDW: 13.8 % (ref 11.2–14.5)
WBC: 6.6 10*3/uL (ref 3.9–10.3)
lymph#: 1.2 10*3/uL (ref 0.9–3.3)

## 2015-03-06 LAB — COMPREHENSIVE METABOLIC PANEL (CC13)
ALT: 23 U/L (ref 0–55)
ANION GAP: 12 meq/L — AB (ref 3–11)
AST: 20 U/L (ref 5–34)
Albumin: 4 g/dL (ref 3.5–5.0)
Alkaline Phosphatase: 70 U/L (ref 40–150)
BILIRUBIN TOTAL: 0.36 mg/dL (ref 0.20–1.20)
BUN: 13.8 mg/dL (ref 7.0–26.0)
CHLORIDE: 102 meq/L (ref 98–109)
CO2: 23 meq/L (ref 22–29)
CREATININE: 0.8 mg/dL (ref 0.6–1.1)
Calcium: 9.3 mg/dL (ref 8.4–10.4)
EGFR: 69 mL/min/{1.73_m2} — AB (ref >90)
GLUCOSE: 106 mg/dL (ref 70–140)
Potassium: 4 mEq/L (ref 3.5–5.1)
Sodium: 138 mEq/L (ref 136–145)
TOTAL PROTEIN: 6.6 g/dL (ref 6.4–8.3)

## 2015-03-06 MED ORDER — SODIUM CHLORIDE 0.9 % IJ SOLN
10.0000 mL | INTRAMUSCULAR | Status: DC | PRN
  Administered 2015-03-06: 10 mL
  Filled 2015-03-06: qty 10

## 2015-03-06 MED ORDER — PALONOSETRON HCL INJECTION 0.25 MG/5ML
0.2500 mg | Freq: Once | INTRAVENOUS | Status: AC
  Administered 2015-03-06: 0.25 mg via INTRAVENOUS

## 2015-03-06 MED ORDER — CARBOPLATIN CHEMO INJECTION 450 MG/45ML
130.0000 mg | Freq: Once | INTRAVENOUS | Status: AC
  Administered 2015-03-06: 130 mg via INTRAVENOUS
  Filled 2015-03-06: qty 13

## 2015-03-06 MED ORDER — DIPHENHYDRAMINE HCL 50 MG/ML IJ SOLN
25.0000 mg | Freq: Once | INTRAMUSCULAR | Status: AC
  Administered 2015-03-06: 25 mg via INTRAVENOUS

## 2015-03-06 MED ORDER — FAMOTIDINE IN NACL 20-0.9 MG/50ML-% IV SOLN
INTRAVENOUS | Status: AC
  Filled 2015-03-06: qty 50

## 2015-03-06 MED ORDER — SODIUM CHLORIDE 0.9 % IV SOLN
Freq: Once | INTRAVENOUS | Status: AC
  Administered 2015-03-06: 13:00:00 via INTRAVENOUS
  Filled 2015-03-06: qty 1

## 2015-03-06 MED ORDER — SODIUM CHLORIDE 0.9 % IV SOLN
Freq: Once | INTRAVENOUS | Status: AC
  Administered 2015-03-06: 12:00:00 via INTRAVENOUS

## 2015-03-06 MED ORDER — PACLITAXEL CHEMO INJECTION 300 MG/50ML
80.0000 mg/m2 | Freq: Once | INTRAVENOUS | Status: AC
  Administered 2015-03-06: 120 mg via INTRAVENOUS
  Filled 2015-03-06: qty 20

## 2015-03-06 MED ORDER — PALONOSETRON HCL INJECTION 0.25 MG/5ML
INTRAVENOUS | Status: AC
  Filled 2015-03-06: qty 5

## 2015-03-06 MED ORDER — DIPHENHYDRAMINE HCL 50 MG/ML IJ SOLN
INTRAMUSCULAR | Status: AC
  Filled 2015-03-06: qty 1

## 2015-03-06 MED ORDER — HEPARIN SOD (PORK) LOCK FLUSH 100 UNIT/ML IV SOLN
500.0000 [IU] | Freq: Once | INTRAVENOUS | Status: AC | PRN
  Administered 2015-03-06: 500 [IU]
  Filled 2015-03-06: qty 5

## 2015-03-06 MED ORDER — FAMOTIDINE IN NACL 20-0.9 MG/50ML-% IV SOLN
20.0000 mg | Freq: Once | INTRAVENOUS | Status: AC
  Administered 2015-03-06: 20 mg via INTRAVENOUS

## 2015-03-06 NOTE — Patient Instructions (Signed)
[facility] Discharge Instructions for Patients Receiving Chemotherapy  Today you received the following chemotherapy agents Taxol/Carboplatin To help prevent nausea and vomiting after your treatment, we encourage you to take your nausea medication as prescribed.   If you develop nausea and vomiting that is not controlled by your nausea medication, call the clinic.   BELOW ARE SYMPTOMS THAT SHOULD BE REPORTED IMMEDIATELY:  *FEVER GREATER THAN 100.5 F  *CHILLS WITH OR WITHOUT FEVER  NAUSEA AND VOMITING THAT IS NOT CONTROLLED WITH YOUR NAUSEA MEDICATION  *UNUSUAL SHORTNESS OF BREATH  *UNUSUAL BRUISING OR BLEEDING  TENDERNESS IN MOUTH AND THROAT WITH OR WITHOUT PRESENCE OF ULCERS  *URINARY PROBLEMS  *BOWEL PROBLEMS  UNUSUAL RASH Items with * indicate a potential emergency and should be followed up as soon as possible.  Feel free to call the clinic you have any questions or concerns. The clinic phone number is [phone].  Please show the CHEMO ALERT CARD at check-in to the Emergency Department and triage nurse.   

## 2015-03-12 NOTE — Assessment & Plan Note (Signed)
Right breast 5 x 6 x 3.8 cm lesion of non-masslike and nodular enhancement: Invasive ductal carcinoma with DCIS, grade 3, ER 0%, PR 0%, HER-2 negative ratio 1.16, Ki-67 83% Right Breast LOQ: 01/30/15: DCIS involving the papillary lesion ER 51%, PR 45% Left breast 4.5x5x3.5 cm area of non-mass enhancement adjacent to complex sclerosing lesion extending 3.5 cm IDC with DCIS, ER 100%; PR 63%, Ki 67 13%; Her 2 Neg; Right: LOQ ER 51%, PR 45%  Treatment Plan: Neoadj taxol-carbo weekly x 12 if no adequate response, will do AC. Foll by surgery, XRT foll by Anti-estrogen therapy. Current Treatment: Cycle 4/12 Taxol- carboplatin weekly Chemotherapy toxicities: 1. Queasiness in the stomach : I will change her antiemetic from Zofran to Aloxi 2. Immobility related to Benadryl: Decrease the dosage to 25 mg 3. Severe anxiety /depression: we will try to arrange a consultation with psychiatrist. She has an appointment on June 1 but we would like to move it up if possible.  RTC 2 weeks

## 2015-03-13 ENCOUNTER — Ambulatory Visit (HOSPITAL_BASED_OUTPATIENT_CLINIC_OR_DEPARTMENT_OTHER): Payer: Medicare Other

## 2015-03-13 ENCOUNTER — Other Ambulatory Visit (HOSPITAL_BASED_OUTPATIENT_CLINIC_OR_DEPARTMENT_OTHER): Payer: Medicare Other

## 2015-03-13 ENCOUNTER — Telehealth: Payer: Self-pay | Admitting: Hematology and Oncology

## 2015-03-13 ENCOUNTER — Ambulatory Visit (HOSPITAL_BASED_OUTPATIENT_CLINIC_OR_DEPARTMENT_OTHER): Payer: Medicare Other | Admitting: Hematology and Oncology

## 2015-03-13 ENCOUNTER — Encounter: Payer: Self-pay | Admitting: General Practice

## 2015-03-13 VITALS — BP 139/58 | HR 80 | Temp 98.1°F | Resp 17 | Ht 60.0 in | Wt 110.4 lb

## 2015-03-13 DIAGNOSIS — Z5111 Encounter for antineoplastic chemotherapy: Secondary | ICD-10-CM

## 2015-03-13 DIAGNOSIS — C50311 Malignant neoplasm of lower-inner quadrant of right female breast: Secondary | ICD-10-CM

## 2015-03-13 DIAGNOSIS — F419 Anxiety disorder, unspecified: Secondary | ICD-10-CM | POA: Diagnosis not present

## 2015-03-13 DIAGNOSIS — C50412 Malignant neoplasm of upper-outer quadrant of left female breast: Secondary | ICD-10-CM | POA: Diagnosis present

## 2015-03-13 LAB — CBC WITH DIFFERENTIAL/PLATELET
BASO%: 0.9 % (ref 0.0–2.0)
Basophils Absolute: 0 10*3/uL (ref 0.0–0.1)
EOS%: 0.9 % (ref 0.0–7.0)
Eosinophils Absolute: 0 10*3/uL (ref 0.0–0.5)
HCT: 38 % (ref 34.8–46.6)
HGB: 13 g/dL (ref 11.6–15.9)
LYMPH%: 19.1 % (ref 14.0–49.7)
MCH: 29.7 pg (ref 25.1–34.0)
MCHC: 34.2 g/dL (ref 31.5–36.0)
MCV: 86.8 fL (ref 79.5–101.0)
MONO#: 0.5 10*3/uL (ref 0.1–0.9)
MONO%: 10.6 % (ref 0.0–14.0)
NEUT#: 3.1 10*3/uL (ref 1.5–6.5)
NEUT%: 68.5 % (ref 38.4–76.8)
Platelets: 208 10*3/uL (ref 145–400)
RBC: 4.38 10*6/uL (ref 3.70–5.45)
RDW: 14 % (ref 11.2–14.5)
WBC: 4.5 10*3/uL (ref 3.9–10.3)
lymph#: 0.9 10*3/uL (ref 0.9–3.3)

## 2015-03-13 LAB — COMPREHENSIVE METABOLIC PANEL (CC13)
ALK PHOS: 67 U/L (ref 40–150)
ALT: 18 U/L (ref 0–55)
ANION GAP: 12 meq/L — AB (ref 3–11)
AST: 18 U/L (ref 5–34)
Albumin: 4 g/dL (ref 3.5–5.0)
BILIRUBIN TOTAL: 0.33 mg/dL (ref 0.20–1.20)
BUN: 10.7 mg/dL (ref 7.0–26.0)
CO2: 23 mEq/L (ref 22–29)
Calcium: 9.3 mg/dL (ref 8.4–10.4)
Chloride: 102 mEq/L (ref 98–109)
Creatinine: 0.8 mg/dL (ref 0.6–1.1)
EGFR: 71 mL/min/{1.73_m2} — AB (ref >90)
GLUCOSE: 104 mg/dL (ref 70–140)
Potassium: 4.2 mEq/L (ref 3.5–5.1)
Sodium: 137 mEq/L (ref 136–145)
TOTAL PROTEIN: 6.7 g/dL (ref 6.4–8.3)

## 2015-03-13 MED ORDER — SODIUM CHLORIDE 0.9 % IV SOLN
130.0000 mg | Freq: Once | INTRAVENOUS | Status: AC
  Administered 2015-03-13: 130 mg via INTRAVENOUS
  Filled 2015-03-13: qty 13

## 2015-03-13 MED ORDER — FAMOTIDINE IN NACL 20-0.9 MG/50ML-% IV SOLN
20.0000 mg | Freq: Once | INTRAVENOUS | Status: AC
  Administered 2015-03-13: 20 mg via INTRAVENOUS

## 2015-03-13 MED ORDER — SODIUM CHLORIDE 0.9 % IV SOLN
Freq: Once | INTRAVENOUS | Status: AC
  Administered 2015-03-13: 11:00:00 via INTRAVENOUS
  Filled 2015-03-13: qty 1

## 2015-03-13 MED ORDER — PALONOSETRON HCL INJECTION 0.25 MG/5ML
INTRAVENOUS | Status: AC
  Filled 2015-03-13: qty 5

## 2015-03-13 MED ORDER — DIPHENHYDRAMINE HCL 50 MG/ML IJ SOLN
25.0000 mg | Freq: Once | INTRAMUSCULAR | Status: AC
  Administered 2015-03-13: 25 mg via INTRAVENOUS

## 2015-03-13 MED ORDER — HEPARIN SOD (PORK) LOCK FLUSH 100 UNIT/ML IV SOLN
500.0000 [IU] | Freq: Once | INTRAVENOUS | Status: AC | PRN
  Administered 2015-03-13: 500 [IU]
  Filled 2015-03-13: qty 5

## 2015-03-13 MED ORDER — SODIUM CHLORIDE 0.9 % IV SOLN
Freq: Once | INTRAVENOUS | Status: AC
  Administered 2015-03-13: 10:00:00 via INTRAVENOUS

## 2015-03-13 MED ORDER — DIPHENHYDRAMINE HCL 50 MG/ML IJ SOLN
INTRAMUSCULAR | Status: AC
  Filled 2015-03-13: qty 1

## 2015-03-13 MED ORDER — PALONOSETRON HCL INJECTION 0.25 MG/5ML
0.2500 mg | Freq: Once | INTRAVENOUS | Status: AC
  Administered 2015-03-13: 0.25 mg via INTRAVENOUS

## 2015-03-13 MED ORDER — DEXTROSE 5 % IV SOLN
80.0000 mg/m2 | Freq: Once | INTRAVENOUS | Status: AC
  Administered 2015-03-13: 120 mg via INTRAVENOUS
  Filled 2015-03-13: qty 20

## 2015-03-13 MED ORDER — FAMOTIDINE IN NACL 20-0.9 MG/50ML-% IV SOLN
INTRAVENOUS | Status: AC
  Filled 2015-03-13: qty 50

## 2015-03-13 MED ORDER — SODIUM CHLORIDE 0.9 % IJ SOLN
10.0000 mL | INTRAMUSCULAR | Status: DC | PRN
  Administered 2015-03-13: 10 mL
  Filled 2015-03-13: qty 10

## 2015-03-13 NOTE — Telephone Encounter (Signed)
Patient on schedule already and declined a new schedule

## 2015-03-13 NOTE — Progress Notes (Signed)
Spiritual Care Note  Referred by Iris Pert, RN for pt support when Dailyn was feeling distressed at her appt prior to chemo.  Met with Felicity Coyer, and Dr Lindi Adie.  Pt reiterated her desire to complete tx per current POC.  She also repeated her interest in seeing a psychiatrist for emotional support; consulted about support resources with [name], LCSW, encouraging pt to keep her appointment with [facility] on 03/21/15 as the fastest route to additional support.  Provided emotional support and calming presence, working with RN to normalize feelings common with starting chemo (sense of disorientation, distress at loss of control, etc), which pt found reassuring.  Followed up with Webb Silversmith in infusion room, providing further pastoral presence and prayer per request, which pt indicated was very helpful.  Many of pt's emotional concerns are long-standing issues beyond the scope of [facility] team.  Will continue to [place] when she is on campus to help her with stress management, tools for staying focused, and prayer as desired.  Please also page as needs arise:  [phone].  Thank you.  [place], [place]

## 2015-03-13 NOTE — Progress Notes (Signed)
Patient Care Team: Dixie Dials, MD as PCP - General (Cardiology) [name], MD as Consulting Physician (General Surgery) Nicholas Lose, MD as Consulting Physician (Hematology and Oncology) Arloa Koh, MD as Consulting Physician (Radiation Oncology) Rockwell Germany, RN as Registered Nurse Mauro Kaufmann, RN as Registered Nurse Holley Bouche, NP as Nurse Practitioner (Nurse Practitioner)  DIAGNOSIS: Breast cancer of lower-inner quadrant of right female breast   Staging form: Breast, AJCC 7th Edition     Clinical stage from 01/24/2015: Stage IIA (T2, N0, M0) - Unsigned   SUMMARY OF ONCOLOGIC HISTORY:   Breast cancer of lower-inner quadrant of right female breast   01/10/2015 Imaging Ultrasound breast: Right breast 4:00: 2.3 cm lesion, at 4:00 6 cm from nipple to 0.4 cm hypoechoic mass, left breast 1130; 2 cm from nipple 0.4 cm mass   01/12/2015 Initial Diagnosis Right breast biopsy: Invasive ductal carcinoma with DCIS, grade 3, ER 0%, PR 0%, HER-2 negative ratio 1.16, Ki-67 83%; left breast biopsy complex sclerosing lesion   01/19/2015 Breast MRI Right breast 5 x 6 x 3.8 cm lesion of non-masslike and nodular enhancement: Left breast 4.5x5x3.5 cm area of non-mass enhancement adjacent to complex sclerosing lesion extending 3.5 sinus lateral to biopsy, no lymph nodes   01/30/2015 Initial Biopsy Left Breast Biopsy: IDC with DCIS, ER 100%; PR 63%, Ki 67 13%; Her 2 Neg; Right: LOQ ER 51%, PR 45%   02/20/2015 -  Neo-Adjuvant Chemotherapy Neoadjuvant Taxol and carboplatin weekly 12    CHIEF COMPLIANT: Week 4/12 Taxol and carboplatin  INTERVAL HISTORY: Grace Paul is a 80 year old with above-mentioned history of right-sided breast cancer currently on neoadjuvant chemotherapy with weekly Taxol and carboplatin. She has been tolerating chemotherapy extremely well with very limited side effects from the treatment itself. She has a lot of emotional problems and psychiatric illness with severe  depression. Each and every visit our navigator's and counselors social workers and chaplain have been meeting her and trying to help her in these tough times. She reports that emotionally she is in difficult place although she is able to sleep 10 hours every night with these. She does take Ativan when she gets very anxious.  REVIEW OF SYSTEMS:   Constitutional: Denies fevers, chills or abnormal weight loss Eyes: Denies blurriness of vision Ears, nose, mouth, throat, and face: Denies mucositis or sore throat Respiratory: Denies cough, dyspnea or wheezes Cardiovascular: Denies palpitation, chest discomfort or lower extremity swelling Gastrointestinal:  Denies nausea, heartburn or change in bowel habits Skin: Denies abnormal skin rashes Lymphatics: Denies new lymphadenopathy or easy bruising Neurological:Denies numbness, tingling or new weaknesses Behavioral/Psych: Mood is stable, no new changes  Breast: Reports of the breast tumor has disappeared. All other systems were reviewed with the patient and are negative.  I have reviewed the past medical history, past surgical history, social history and family history with the patient and they are unchanged from previous note.  ALLERGIES:  is allergic to gentian violet; clarithromycin; cleocin; compazine; epinephrine; and hyoscyamine sulfate.  MEDICATIONS:  Current Outpatient Prescriptions  Medication Sig Dispense Refill  . acetaminophen (TYLENOL) 325 MG tablet Take 650 mg by mouth every 6 (six) hours as needed for mild pain or moderate pain.    Marland Kitchen amLODipine (NORVASC) 2.5 MG tablet Take 2.5 mg by mouth every morning. Patient monitors blood pressure so does not necessarily take every day  0  . cephALEXin (KEFLEX) 250 MG capsule   0  . Cyanocobalamin (VITAMIN B 12) 100 MCG LOZG Take  1 tablet by mouth daily. Patient does not take every day    . esomeprazole (NEXIUM) 40 MG capsule Take 40 mg by mouth daily as needed. For indigestion.    Marland Kitchen  HYDROcodone-acetaminophen (NORCO) 5-325 MG per tablet Take 1-2 tablets by mouth every 6 (six) hours as needed. 30 tablet 0  . lidocaine-prilocaine (EMLA) cream Apply 1 application topically as needed. 30 g 1  . LORazepam (ATIVAN) 1 MG tablet Take 0.5 tablets (0.5 mg total) by mouth 3 (three) times daily as needed for anxiety. 30 tablet 0  . meclizine (ANTIVERT) 12.5 MG tablet Take 12.5 mg by mouth 2 (two) times daily as needed for dizziness.   0  . metoprolol succinate (TOPROL-XL) 25 MG 24 hr tablet Take 12.5 mg by mouth 2 (two) times daily.     Marland Kitchen neomycin-polymyxin-hydrocortisone (CORTISPORIN) 3.5-10000-1 otic suspension Place 3 drops into both ears daily as needed (when ear bothers her).   0  . ondansetron (ZOFRAN) 8 MG tablet Take 1 tablet (8 mg total) by mouth every 8 (eight) hours as needed. 30 tablet 1  . prochlorperazine (COMPAZINE) 10 MG tablet Take 1 tablet (10 mg total) by mouth every 6 (six) hours as needed (Nausea or vomiting). 30 tablet 1  . temazepam (RESTORIL) 15 MG capsule Take 7.5 mg by mouth at bedtime. For sleep.     No current facility-administered medications for this visit.    PHYSICAL EXAMINATION: ECOG PERFORMANCE STATUS: 2 - Symptomatic, <50% confined to bed  Filed Vitals:   03/13/15 0909  BP: 139/58  Pulse: 80  Temp: 98.1 F (36.7 C)  Resp: 17   Filed Weights   03/13/15 0909  Weight: 110 lb 6 oz (50.066 kg)    GENERAL:alert, no distress and comfortable SKIN: skin color, texture, turgor are normal, no rashes or significant lesions EYES: normal, Conjunctiva are pink and non-injected, sclera clear OROPHARYNX:no exudate, no erythema and lips, buccal mucosa, and tongue normal  NECK: supple, thyroid normal size, non-tender, without nodularity LYMPH:  no palpable lymphadenopathy in the cervical, axillary or inguinal LUNGS: clear to auscultation and percussion with normal breathing effort HEART: regular rate & rhythm and no murmurs and no lower extremity  edema ABDOMEN:abdomen soft, non-tender and normal bowel sounds Musculoskeletal:no cyanosis of digits and no clubbing  NEURO: alert & oriented x 3 with fluent speech, no focal motor/sensory deficits BREAST: I could not palpate a lump underneath the right breast. (exam performed in the presence of a chaperone)  LABORATORY DATA:  I have reviewed the data as listed   Chemistry      Component Value Date/Time   NA 138 03/06/2015 1049   K 4.0 03/06/2015 1049   CO2 23 03/06/2015 1049   BUN 13.8 03/06/2015 1049   CREATININE 0.8 03/06/2015 1049   CREATININE 0.70 02/07/2015 1831      Component Value Date/Time   CALCIUM 9.3 03/06/2015 1049   ALKPHOS 70 03/06/2015 1049   AST 20 03/06/2015 1049   ALT 23 03/06/2015 1049   BILITOT 0.36 03/06/2015 1049       Lab Results  Component Value Date   WBC 4.5 03/13/2015   HGB 13.0 03/13/2015   HCT 38.0 03/13/2015   MCV 86.8 03/13/2015   PLT 208 03/13/2015   NEUTROABS 3.1 03/13/2015    ASSESSMENT & PLAN:  Breast cancer of lower-inner quadrant of right female breast Right breast 5 x 6 x 3.8 cm lesion of non-masslike and nodular enhancement: Invasive ductal carcinoma with DCIS, grade 3,  ER 0%, PR 0%, HER-2 negative ratio 1.16, Ki-67 83% Right Breast LOQ: 01/30/15: DCIS involving the papillary lesion ER 51%, PR 45% Left breast 4.5x5x3.5 cm area of non-mass enhancement adjacent to complex sclerosing lesion extending 3.5 cm IDC with DCIS, ER 100%; PR 63%, Ki 67 13%; Her 2 Neg; Right: LOQ ER 51%, PR 45%  Treatment Plan: Neoadj taxol-carbo weekly x 12 if no adequate response, will do AC. Foll by surgery, XRT foll by Anti-estrogen therapy. Current Treatment: Cycle 4/12 Taxol- carboplatin weekly Chemotherapy toxicities: 1. Queasiness in the stomach : Since we switched her to Aloxi, she does not have any nausea issues 2. Immobility related to Benadryl: Decreased the dosage to 25 mg 3. Severe anxiety /depression: we will try to arrange a consultation  with psychiatrist. She has an appointment on June 1 but we would like to move it up if possible. Our breast navigator and chaplain have spent an enormous amount of time counseling her and supporting her in her emotional distress and trying to assist her get through the treatments. I discussed with her that she is always in control of the decision to continue with chemotherapy. She fell that she can handle chemotherapy and is really hoping to get the help she needs from the psychiatrist appointment that is coming up soon.  RTC 2 weeks   No orders of the defined types were placed in this encounter.   The patient has a good understanding of the overall plan. she agrees with it. she will call with any problems that may develop before the next visit here.   Rulon Eisenmenger, MD

## 2015-03-13 NOTE — Patient Instructions (Signed)
[facility] Discharge Instructions for Patients Receiving Chemotherapy  Today you received the following chemotherapy agents taxol, carboplatin  To help prevent nausea and vomiting after your treatment, we encourage you to take your nausea medication as directed by Dr Lindi Adie   If you develop nausea and vomiting that is not controlled by your nausea medication, call the clinic.   BELOW ARE SYMPTOMS THAT SHOULD BE REPORTED IMMEDIATELY:  *FEVER GREATER THAN 100.5 F  *CHILLS WITH OR WITHOUT FEVER  NAUSEA AND VOMITING THAT IS NOT CONTROLLED WITH YOUR NAUSEA MEDICATION  *UNUSUAL SHORTNESS OF BREATH  *UNUSUAL BRUISING OR BLEEDING  TENDERNESS IN MOUTH AND THROAT WITH OR WITHOUT PRESENCE OF ULCERS  *URINARY PROBLEMS  *BOWEL PROBLEMS  UNUSUAL RASH Items with * indicate a potential emergency and should be followed up as soon as possible.  Feel free to call the clinic you have any questions or concerns. The clinic phone number is [phone].  Please show the [facility] at check-in to the Emergency Department and triage nurse.

## 2015-03-15 ENCOUNTER — Encounter (HOSPITAL_COMMUNITY): Payer: Self-pay | Admitting: General Practice

## 2015-03-15 ENCOUNTER — Inpatient Hospital Stay (HOSPITAL_COMMUNITY)
Admission: AD | Admit: 2015-03-15 | Discharge: 2015-03-22 | DRG: 880 | Disposition: A | Discharge status: Skilled Nursing Facility | Payer: Medicare Other | Source: Ambulatory Visit | Attending: Cardiovascular Disease | Admitting: Cardiovascular Disease

## 2015-03-15 DIAGNOSIS — E440 Moderate protein-calorie malnutrition: Secondary | ICD-10-CM | POA: Diagnosis present

## 2015-03-15 DIAGNOSIS — I619 Nontraumatic intracerebral hemorrhage, unspecified: Secondary | ICD-10-CM | POA: Diagnosis present

## 2015-03-15 DIAGNOSIS — I951 Orthostatic hypotension: Secondary | ICD-10-CM | POA: Diagnosis not present

## 2015-03-15 DIAGNOSIS — Z87891 Personal history of nicotine dependence: Secondary | ICD-10-CM

## 2015-03-15 DIAGNOSIS — K219 Gastro-esophageal reflux disease without esophagitis: Secondary | ICD-10-CM | POA: Diagnosis present

## 2015-03-15 DIAGNOSIS — R45851 Suicidal ideations: Secondary | ICD-10-CM | POA: Diagnosis not present

## 2015-03-15 DIAGNOSIS — C50911 Malignant neoplasm of unspecified site of right female breast: Secondary | ICD-10-CM | POA: Diagnosis present

## 2015-03-15 DIAGNOSIS — Z62810 Personal history of physical and sexual abuse in childhood: Secondary | ICD-10-CM | POA: Diagnosis present

## 2015-03-15 DIAGNOSIS — G4700 Insomnia, unspecified: Secondary | ICD-10-CM | POA: Diagnosis present

## 2015-03-15 DIAGNOSIS — F4310 Post-traumatic stress disorder, unspecified: Secondary | ICD-10-CM | POA: Diagnosis present

## 2015-03-15 DIAGNOSIS — Z881 Allergy status to other antibiotic agents status: Secondary | ICD-10-CM

## 2015-03-15 DIAGNOSIS — W1800XA Striking against unspecified object with subsequent fall, initial encounter: Secondary | ICD-10-CM

## 2015-03-15 DIAGNOSIS — C50912 Malignant neoplasm of unspecified site of left female breast: Secondary | ICD-10-CM | POA: Diagnosis present

## 2015-03-15 DIAGNOSIS — I10 Essential (primary) hypertension: Secondary | ICD-10-CM | POA: Diagnosis present

## 2015-03-15 DIAGNOSIS — Z79899 Other long term (current) drug therapy: Secondary | ICD-10-CM

## 2015-03-15 DIAGNOSIS — Z6821 Body mass index (BMI) 21.0-21.9, adult: Secondary | ICD-10-CM

## 2015-03-15 DIAGNOSIS — F322 Major depressive disorder, single episode, severe without psychotic features: Secondary | ICD-10-CM | POA: Diagnosis present

## 2015-03-15 DIAGNOSIS — F419 Anxiety disorder, unspecified: Secondary | ICD-10-CM | POA: Diagnosis present

## 2015-03-15 DIAGNOSIS — F1020 Alcohol dependence, uncomplicated: Secondary | ICD-10-CM | POA: Diagnosis present

## 2015-03-15 DIAGNOSIS — Z9221 Personal history of antineoplastic chemotherapy: Secondary | ICD-10-CM

## 2015-03-15 DIAGNOSIS — Z803 Family history of malignant neoplasm of breast: Secondary | ICD-10-CM

## 2015-03-15 DIAGNOSIS — M542 Cervicalgia: Secondary | ICD-10-CM | POA: Diagnosis not present

## 2015-03-15 DIAGNOSIS — M797 Fibromyalgia: Secondary | ICD-10-CM | POA: Diagnosis present

## 2015-03-15 DIAGNOSIS — R5382 Chronic fatigue, unspecified: Secondary | ICD-10-CM | POA: Diagnosis present

## 2015-03-15 DIAGNOSIS — S066X9A Traumatic subarachnoid hemorrhage with loss of consciousness of unspecified duration, initial encounter: Secondary | ICD-10-CM | POA: Diagnosis not present

## 2015-03-15 DIAGNOSIS — E871 Hypo-osmolality and hyponatremia: Secondary | ICD-10-CM | POA: Diagnosis not present

## 2015-03-15 DIAGNOSIS — Z888 Allergy status to other drugs, medicaments and biological substances status: Secondary | ICD-10-CM

## 2015-03-15 DIAGNOSIS — Y92231 Patient bathroom in hospital as the place of occurrence of the external cause: Secondary | ICD-10-CM

## 2015-03-15 DIAGNOSIS — I609 Nontraumatic subarachnoid hemorrhage, unspecified: Secondary | ICD-10-CM

## 2015-03-15 DIAGNOSIS — Y9389 Activity, other specified: Secondary | ICD-10-CM

## 2015-03-15 DIAGNOSIS — H8109 Meniere's disease, unspecified ear: Secondary | ICD-10-CM | POA: Diagnosis present

## 2015-03-15 DIAGNOSIS — F411 Generalized anxiety disorder: Secondary | ICD-10-CM | POA: Diagnosis not present

## 2015-03-15 DIAGNOSIS — R569 Unspecified convulsions: Secondary | ICD-10-CM | POA: Diagnosis present

## 2015-03-15 DIAGNOSIS — W1812XA Fall from or off toilet with subsequent striking against object, initial encounter: Secondary | ICD-10-CM | POA: Diagnosis not present

## 2015-03-15 DIAGNOSIS — R002 Palpitations: Secondary | ICD-10-CM | POA: Diagnosis present

## 2015-03-15 HISTORY — DX: Adverse effect of unspecified anesthetic, initial encounter: T41.45XA

## 2015-03-15 HISTORY — DX: Other complications of anesthesia, initial encounter: T88.59XA

## 2015-03-15 HISTORY — DX: Nausea with vomiting, unspecified: R11.2

## 2015-03-15 HISTORY — DX: Chronic fatigue, unspecified: R53.82

## 2015-03-15 HISTORY — DX: Myalgic encephalomyelitis/chronic fatigue syndrome: G93.32

## 2015-03-15 HISTORY — DX: Depression, unspecified: F32.A

## 2015-03-15 HISTORY — DX: Malignant neoplasm of unspecified site of unspecified female breast: C50.919

## 2015-03-15 HISTORY — DX: Major depressive disorder, single episode, unspecified: F32.9

## 2015-03-15 HISTORY — DX: Other specified postprocedural states: Z98.890

## 2015-03-15 LAB — CBC
HCT: 37.9 % (ref 36.0–46.0)
Hemoglobin: 13 g/dL (ref 12.0–15.0)
MCH: 29.5 pg (ref 26.0–34.0)
MCHC: 34.3 g/dL (ref 30.0–36.0)
MCV: 85.9 fL (ref 78.0–100.0)
PLATELETS: 233 10*3/uL (ref 150–400)
RBC: 4.41 MIL/uL (ref 3.87–5.11)
RDW: 14.2 % (ref 11.5–15.5)
WBC: 7.7 10*3/uL (ref 4.0–10.5)

## 2015-03-15 LAB — CREATININE, SERUM
CREATININE: 0.96 mg/dL (ref 0.44–1.00)
GFR calc Af Amer: >60 mL/min (ref >60)
GFR calc non Af Amer: 55 mL/min — ABNORMAL LOW (ref >60)

## 2015-03-15 MED ORDER — CLONAZEPAM 0.5 MG PO TABS
0.5000 mg | ORAL_TABLET | Freq: Two times a day (BID) | ORAL | Status: DC
  Administered 2015-03-15 – 2015-03-18 (×5): 0.5 mg via ORAL
  Filled 2015-03-15 (×5): qty 1

## 2015-03-15 MED ORDER — PANTOPRAZOLE SODIUM 40 MG PO TBEC
40.0000 mg | DELAYED_RELEASE_TABLET | Freq: Every day | ORAL | Status: DC
Start: 2015-03-15 — End: 2015-03-22
  Administered 2015-03-15 – 2015-03-22 (×8): 40 mg via ORAL
  Filled 2015-03-15 (×6): qty 1

## 2015-03-15 MED ORDER — AMLODIPINE BESYLATE 2.5 MG PO TABS
2.5000 mg | ORAL_TABLET | Freq: Every morning | ORAL | Status: DC
  Administered 2015-03-15 – 2015-03-21 (×7): 2.5 mg via ORAL
  Filled 2015-03-15 (×8): qty 1

## 2015-03-15 MED ORDER — ADULT MULTIVITAMIN W/MINERALS CH
1.0000 | ORAL_TABLET | Freq: Every day | ORAL | Status: DC
  Administered 2015-03-16 – 2015-03-22 (×7): 1 via ORAL
  Filled 2015-03-15 (×7): qty 1

## 2015-03-15 MED ORDER — TEMAZEPAM 15 MG PO CAPS
15.0000 mg | ORAL_CAPSULE | Freq: Every day | ORAL | Status: DC
  Administered 2015-03-15 – 2015-03-17 (×3): 15 mg via ORAL
  Filled 2015-03-15 (×3): qty 1

## 2015-03-15 MED ORDER — HEPARIN SODIUM (PORCINE) 5000 UNIT/ML IJ SOLN
5000.0000 [IU] | Freq: Three times a day (TID) | INTRAMUSCULAR | Status: DC
  Administered 2015-03-15: 5000 [IU] via SUBCUTANEOUS
  Filled 2015-03-15 (×3): qty 1

## 2015-03-15 MED ORDER — ACETAMINOPHEN 650 MG RE SUPP: 650.0000 mg | Freq: Four times a day (QID) | RECTAL | Status: DC | PRN

## 2015-03-15 MED ORDER — OXYCODONE HCL 5 MG PO TABS
5.0000 mg | ORAL_TABLET | ORAL | Status: DC | PRN
  Administered 2015-03-17 – 2015-03-21 (×7): 5 mg via ORAL
  Filled 2015-03-15 (×7): qty 1

## 2015-03-15 MED ORDER — MECLIZINE HCL 12.5 MG PO TABS
12.5000 mg | ORAL_TABLET | Freq: Three times a day (TID) | ORAL | Status: DC
  Administered 2015-03-15 – 2015-03-22 (×20): 12.5 mg via ORAL
  Filled 2015-03-15 (×23): qty 1

## 2015-03-15 MED ORDER — ARIPIPRAZOLE 5 MG PO TABS
5.0000 mg | ORAL_TABLET | Freq: Every day | ORAL | Status: DC
  Administered 2015-03-16 – 2015-03-20 (×5): 5 mg via ORAL
  Filled 2015-03-15 (×5): qty 1

## 2015-03-15 MED ORDER — ACETAMINOPHEN 325 MG PO TABS
650.0000 mg | ORAL_TABLET | Freq: Four times a day (QID) | ORAL | Status: DC | PRN
  Administered 2015-03-16 – 2015-03-22 (×6): 650 mg via ORAL
  Filled 2015-03-15 (×6): qty 2

## 2015-03-15 MED ORDER — ARIPIPRAZOLE 5 MG PO TABS
5.0000 mg | ORAL_TABLET | Freq: Once | ORAL | Status: AC
  Administered 2015-03-15: 5 mg via ORAL
  Filled 2015-03-15: qty 1

## 2015-03-15 MED ORDER — GABAPENTIN 300 MG PO CAPS
300.0000 mg | ORAL_CAPSULE | Freq: Two times a day (BID) | ORAL | Status: DC
  Administered 2015-03-15 – 2015-03-20 (×10): 300 mg via ORAL
  Filled 2015-03-15 (×11): qty 1

## 2015-03-15 MED ORDER — ONDANSETRON HCL 4 MG PO TABS
8.0000 mg | ORAL_TABLET | Freq: Three times a day (TID) | ORAL | Status: DC | PRN
  Administered 2015-03-16: 8 mg via ORAL
  Filled 2015-03-15: qty 2

## 2015-03-15 MED ORDER — LORAZEPAM 0.5 MG PO TABS
0.5000 mg | ORAL_TABLET | Freq: Three times a day (TID) | ORAL | Status: DC | PRN
  Administered 2015-03-15: 0.5 mg via ORAL
  Filled 2015-03-15: qty 1
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
## 2015-03-15 NOTE — Progress Notes (Signed)
Pt is a new admit from [facility] office, admitted for severe anxiety into 2w29, alert and oriented but very anxious. Vitals stable, currently denies pain, stated she feel like dying at times, suicide precaution initiated. Will continue to monitor pt for safety

## 2015-03-16 ENCOUNTER — Encounter: Payer: Self-pay | Admitting: *Deleted

## 2015-03-16 ENCOUNTER — Telehealth: Payer: Self-pay | Admitting: *Deleted

## 2015-03-16 ENCOUNTER — Observation Stay (HOSPITAL_COMMUNITY): Payer: Medicare Other

## 2015-03-16 DIAGNOSIS — R45851 Suicidal ideations: Secondary | ICD-10-CM | POA: Diagnosis not present

## 2015-03-16 DIAGNOSIS — Z803 Family history of malignant neoplasm of breast: Secondary | ICD-10-CM | POA: Diagnosis not present

## 2015-03-16 DIAGNOSIS — I609 Nontraumatic subarachnoid hemorrhage, unspecified: Secondary | ICD-10-CM | POA: Diagnosis not present

## 2015-03-16 DIAGNOSIS — F329 Major depressive disorder, single episode, unspecified: Secondary | ICD-10-CM | POA: Diagnosis not present

## 2015-03-16 DIAGNOSIS — F411 Generalized anxiety disorder: Secondary | ICD-10-CM | POA: Diagnosis present

## 2015-03-16 DIAGNOSIS — Z888 Allergy status to other drugs, medicaments and biological substances status: Secondary | ICD-10-CM | POA: Diagnosis not present

## 2015-03-16 DIAGNOSIS — E871 Hypo-osmolality and hyponatremia: Secondary | ICD-10-CM | POA: Diagnosis not present

## 2015-03-16 DIAGNOSIS — Z881 Allergy status to other antibiotic agents status: Secondary | ICD-10-CM | POA: Diagnosis not present

## 2015-03-16 DIAGNOSIS — R5382 Chronic fatigue, unspecified: Secondary | ICD-10-CM | POA: Diagnosis present

## 2015-03-16 DIAGNOSIS — W1812XA Fall from or off toilet with subsequent striking against object, initial encounter: Secondary | ICD-10-CM | POA: Diagnosis not present

## 2015-03-16 DIAGNOSIS — Y9389 Activity, other specified: Secondary | ICD-10-CM | POA: Diagnosis not present

## 2015-03-16 DIAGNOSIS — S06369D Traumatic hemorrhage of cerebrum, unspecified, with loss of consciousness of unspecified duration, subsequent encounter: Secondary | ICD-10-CM | POA: Diagnosis not present

## 2015-03-16 DIAGNOSIS — I951 Orthostatic hypotension: Secondary | ICD-10-CM | POA: Diagnosis not present

## 2015-03-16 DIAGNOSIS — F418 Other specified anxiety disorders: Secondary | ICD-10-CM | POA: Diagnosis not present

## 2015-03-16 DIAGNOSIS — R002 Palpitations: Secondary | ICD-10-CM | POA: Diagnosis present

## 2015-03-16 DIAGNOSIS — Z9221 Personal history of antineoplastic chemotherapy: Secondary | ICD-10-CM | POA: Diagnosis not present

## 2015-03-16 DIAGNOSIS — Z62810 Personal history of physical and sexual abuse in childhood: Secondary | ICD-10-CM | POA: Diagnosis present

## 2015-03-16 DIAGNOSIS — C50911 Malignant neoplasm of unspecified site of right female breast: Secondary | ICD-10-CM | POA: Diagnosis present

## 2015-03-16 DIAGNOSIS — M542 Cervicalgia: Secondary | ICD-10-CM | POA: Diagnosis not present

## 2015-03-16 DIAGNOSIS — C50912 Malignant neoplasm of unspecified site of left female breast: Secondary | ICD-10-CM | POA: Diagnosis present

## 2015-03-16 DIAGNOSIS — Y92231 Patient bathroom in hospital as the place of occurrence of the external cause: Secondary | ICD-10-CM | POA: Diagnosis not present

## 2015-03-16 DIAGNOSIS — G4700 Insomnia, unspecified: Secondary | ICD-10-CM | POA: Diagnosis present

## 2015-03-16 DIAGNOSIS — I619 Nontraumatic intracerebral hemorrhage, unspecified: Secondary | ICD-10-CM | POA: Diagnosis present

## 2015-03-16 DIAGNOSIS — K219 Gastro-esophageal reflux disease without esophagitis: Secondary | ICD-10-CM | POA: Diagnosis present

## 2015-03-16 DIAGNOSIS — Z6821 Body mass index (BMI) 21.0-21.9, adult: Secondary | ICD-10-CM | POA: Diagnosis not present

## 2015-03-16 DIAGNOSIS — S06369A Traumatic hemorrhage of cerebrum, unspecified, with loss of consciousness of unspecified duration, initial encounter: Secondary | ICD-10-CM | POA: Diagnosis not present

## 2015-03-16 DIAGNOSIS — I10 Essential (primary) hypertension: Secondary | ICD-10-CM | POA: Diagnosis present

## 2015-03-16 DIAGNOSIS — Z79899 Other long term (current) drug therapy: Secondary | ICD-10-CM | POA: Diagnosis not present

## 2015-03-16 DIAGNOSIS — Z87891 Personal history of nicotine dependence: Secondary | ICD-10-CM | POA: Diagnosis not present

## 2015-03-16 DIAGNOSIS — S066X9A Traumatic subarachnoid hemorrhage with loss of consciousness of unspecified duration, initial encounter: Secondary | ICD-10-CM | POA: Diagnosis not present

## 2015-03-16 DIAGNOSIS — F322 Major depressive disorder, single episode, severe without psychotic features: Secondary | ICD-10-CM | POA: Diagnosis present

## 2015-03-16 DIAGNOSIS — R569 Unspecified convulsions: Secondary | ICD-10-CM | POA: Diagnosis present

## 2015-03-16 DIAGNOSIS — E440 Moderate protein-calorie malnutrition: Secondary | ICD-10-CM | POA: Diagnosis present

## 2015-03-16 DIAGNOSIS — M797 Fibromyalgia: Secondary | ICD-10-CM | POA: Diagnosis present

## 2015-03-16 DIAGNOSIS — F1020 Alcohol dependence, uncomplicated: Secondary | ICD-10-CM | POA: Diagnosis present

## 2015-03-16 DIAGNOSIS — F4310 Post-traumatic stress disorder, unspecified: Secondary | ICD-10-CM | POA: Diagnosis present

## 2015-03-16 DIAGNOSIS — H8109 Meniere's disease, unspecified ear: Secondary | ICD-10-CM | POA: Diagnosis present

## 2015-03-16 LAB — BASIC METABOLIC PANEL
ANION GAP: 9 (ref 5–15)
BUN: 15 mg/dL (ref 6–20)
CHLORIDE: 100 mmol/L — AB (ref 101–111)
CO2: 25 mmol/L (ref 22–32)
Calcium: 8.9 mg/dL (ref 8.9–10.3)
Creatinine, Ser: 0.74 mg/dL (ref 0.44–1.00)
GFR calc Af Amer: >60 mL/min (ref >60)
Glucose, Bld: 100 mg/dL — ABNORMAL HIGH (ref 65–99)
Potassium: 4.1 mmol/L (ref 3.5–5.1)
SODIUM: 134 mmol/L — AB (ref 135–145)

## 2015-03-16 LAB — GLUCOSE, CAPILLARY: GLUCOSE-CAPILLARY: 138 mg/dL — AB (ref 65–99)

## 2015-03-16 LAB — CBC
HCT: 32.2 % — ABNORMAL LOW (ref 36.0–46.0)
Hemoglobin: 11 g/dL — ABNORMAL LOW (ref 12.0–15.0)
MCH: 29.4 pg (ref 26.0–34.0)
MCHC: 34.2 g/dL (ref 30.0–36.0)
MCV: 86.1 fL (ref 78.0–100.0)
Platelets: 187 10*3/uL (ref 150–400)
RBC: 3.74 MIL/uL — AB (ref 3.87–5.11)
RDW: 14.4 % (ref 11.5–15.5)
WBC: 4.2 10*3/uL (ref 4.0–10.5)

## 2015-03-16 MED ORDER — ENSURE ENLIVE PO LIQD
237.0000 mL | Freq: Two times a day (BID) | ORAL | Status: DC
  Administered 2015-03-16 – 2015-03-22 (×9): 237 mL via ORAL

## 2015-03-16 NOTE — Progress Notes (Signed)
Chaplain responded to nurse request:  Explored anxiety: Gained insight into the origins and effects of her anxiety.   Explored fears: Hali stated her fear is  the idea of losing her mind and doing something inappropriate at the wrong time or in the wrong place.   Lelar also has a Actuary with her due to talking about suicide last night.  Vassie also fail this morning.  Kort[place]'s nurse Sherlynn Stalls was very helpful in helping me contact Shadasia's friend Jenny Reichmann (per Chenel's request).     Will follow-up if needed.   03/16/15 1300  Clinical Encounter Type  Visited With Patient;Health care provider  Visit Type Spiritual support;Behavioral Health;Code  Referral From Nurse  Consult/Referral To Chaplain

## 2015-03-16 NOTE — Progress Notes (Signed)
Called by RN for patient who fell in bathroom and was unresponsive for a few seconds.  Upon my arrival to [place] room, RN in room and other staff lifting her back to bed.  AS per sitter, patient was in bathroom and had a bowel movement and patient stated she needed help and sitter/tech heard her fall on floor.  Blood noted on the back of her head and floor.  PAtietn does not remember events , c/o dizziness and headache.  MD paged and updated.  Orders for stat ct scan
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
## 2015-03-16 NOTE — Progress Notes (Signed)
Pt had a fall in the bathroom while trying to get up from the commode, Staff immediately ran into the room to help the pt up to the bed, vitals taken and was stable, Attending Dr. Doylene Canard notified, CT scan of the head ordered and Bed rest recommended. PT continue to be monitored by 24hr sitter,  No c/o of headache or distress noted. Will continue to observe pt closely.
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
## 2015-03-16 NOTE — Progress Notes (Addendum)
   03/16/15 1000  Clinical Encounter Type  Visited With Other (Comment) (referred to [facility] chaplain for f/u support)  Referral From Nurse Iris Pert, RN - [facility] Nurse Navigator (breast))  Consult/Referral To Chaplain   Following Ms [facility] for spiritual and emotional support, particularly calming strategies that help her feel supported and cared for, at [facility].  Updated on pt's admission to [facility] by [place]; referred to [facility] chaplain for f/u support.  Please also page as needs arise:  [facility] chaplain 24/7 pager:  [phone].  Thank you.  [place], [place] [phone]  Addendum:  Patient casually mentioned during contact on 03/13/15 that she has had a gun in her home for 30 years.  In light of current admission, this seems a potential concern.

## 2015-03-16 NOTE — Progress Notes (Signed)
[place] Psychosocial Distress Screening Clinical Social Work  Clinical Social Work was referred by distress screening protocol.  The patient scored a 8 on the Psychosocial Distress Thermometer which indicates severe distress. Clinical Social Worker reviewed chart to assess for distress and other psychosocial needs. [facility] made aware pt currently admitted to [facility] due to extreme anxiety. Pt had appt scheduled for detailed assessment at [facility] for June 1. CSW team , chaplains and RN navigators have all followed pt very closely for additional support due to her h/o anxiety. This CSW is concerned due to current anxiety state as documented in pt's inpt record. This is a huge change from previous level of anxiety for pt.  [facility] CSW will reach out to inpt CSW staff who received consult as well. CSW at [facility] will follow along and assist again in the out pt setting.   ONCBCN DISTRESS SCREENING 03/13/2015  Screening Type Change in Status  Distress experienced in past week (1-10) 8  Practical problem type   Emotional problem type Adjusting to illness;Isolation/feeling alone  Physical Problem type   Physician notified of physical symptoms   Referral to clinical psychology   Referral to clinical social work Yes  Referral to dietition   Referral to financial advocate   Referral to support programs   Referral to palliative care     Clinical Social Worker follow up needed: Yes.    If yes, follow up plan: See above Loren Racer, [place] Worker [place]  [facility] Phone: [phone] Fax: [phone]
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
## 2015-03-16 NOTE — Progress Notes (Signed)
Called doctor Doylene Canard asked him if he could call a neurologist to look at Grace Paul to assess her head injury he said yes I will do that.

## 2015-03-16 NOTE — Progress Notes (Signed)
Initial Nutrition Assessment  DOCUMENTATION CODES:  Non-severe (moderate) malnutrition in context of chronic illness  INTERVENTION:  Ensure Enlive (each supplement provides 350kcal and 20 grams of protein)  BID  NUTRITION DIAGNOSIS:  Malnutrition related to chronic illness as evidenced by moderate depletions of muscle mass, moderate depletion of body fat.  GOAL:  Patient will meet greater than or equal to 90% of their needs  MONITOR:  PO intake, Labs, Weight trends, I & O's  REASON FOR ASSESSMENT:  Malnutrition Screening Tool    ASSESSMENT: Pt is a 80 y.o. female with history of HTN, anxiety, breast cancer admitted on 5/26 with anxiety, palpitations and nervousness. Morning of 5/27 she had an episode of LOC while having a BM. She fell to the floor, there was noted blood on the posterior surface of her had and the floor.   Pt in bed, complaining of stomach ache at time of visit, asked for milk which she was provided. Lunch tray at bedside, about 50% completed, pt states that since cancer diagnosis in April her appetite has not been good and she is not able to eat as musch as she used to. Pt states she is concerned about the weight loss she has experienced since the diagnosis, but per RN, pt often mentions not wanting to gain weight. Pt ate 60% of her breakfast after some encouragement, per RN. Weight history shows 6 Lb weight loss in 1 month (5%, significant for time frame). Nutrition Focused Physical Exam showed moderate fat and muscle mass depletion.  Pt states she likes Ensure, and tries to drink one per day at home, however she is agreeable to BID due to poor intake while in the hospital. Will continue to monitor.   Labs reviewed: Na 134, Glu 100 - 138  Height:  Ht Readings from Last 1 Encounters:  03/15/15 5' (1.524 m)    Weight:  Wt Readings from Last 1 Encounters:  03/15/15 109 lb 5.6 oz (49.6 kg)    Ideal Body Weight:  45.5 kg  Wt Readings from Last 10  Encounters:  03/15/15 109 lb 5.6 oz (49.6 kg)  03/13/15 110 lb 6 oz (50.066 kg)  02/27/15 111 lb 4.8 oz (50.485 kg)  02/20/15 113 lb (51.256 kg)  02/15/15 112 lb 12.8 oz (51.166 kg)  02/07/15 115 lb (52.164 kg)  02/07/15 113 lb 3.2 oz (51.347 kg)  01/24/15 116 lb 1.6 oz (52.663 kg)  10/10/12 119 lb (53.978 kg)    BMI:  Body mass index is 21.36 kg/(m^2).  Estimated Nutritional Needs:  Kcal:  1100 - 1300  Protein:  65 - 80 g  Fluid:  > 1.3 L  Skin:  Reviewed, no issues  Diet Order:  Diet Heart Room service appropriate?: Yes; Fluid consistency:: Thin  EDUCATION NEEDS:  No education needs identified at this time   Intake/Output Summary (Last 24 hours) at 03/16/15 1509 Last data filed at 03/16/15 0100  Gross per 24 hour  Intake    510 ml  Output      0 ml  Net    510 ml    Last BM:  5/27  Grace Paul A. [facility] Dietetic Intern Pager: [phone] 03/16/2015 3:21 PM

## 2015-03-16 NOTE — Telephone Encounter (Signed)
Error

## 2015-03-16 NOTE — Progress Notes (Signed)
Ref: Birdie Riddle, MD   Subjective:  Resting comfortably. Reviewed CT head with radiology in AM and Neurology consult obtained and appreciated. Bedside commode ordered. Good oral intake per nurse. Blood work results of CBC and BMET appear stable.  Objective:  Vital Signs in the last 24 hours: Temp:  [97.9 F (36.6 C)-98.7 F (37.1 C)] 97.9 F (36.6 C) (05/27 1018) Pulse Rate:  [68-162] 162 (05/27 1022) Cardiac Rhythm:  [-] Normal sinus rhythm (05/26 2000) Resp:  [14-20] 20 (05/27 1022) BP: (107-150)/(51-101) 121/101 mmHg (05/27 1022) SpO2:  [96 %-100 %] 100 % (05/27 1022) Weight:  [49.6 kg (109 lb 5.6 oz)] 49.6 kg (109 lb 5.6 oz) (05/26 1600)  Physical Exam: BP Readings from Last 1 Encounters:  03/16/15 121/101    Wt Readings from Last 1 Encounters:  03/15/15 49.6 kg (109 lb 5.6 oz)    Weight change:   HEENT: [place], Eyes-Blue, PERL, EOMI, Conjunctiva-Pink, Sclera-Non-icteric Occipital area laceration and hematoma. Neck: No JVD, No bruit, Trachea midline. Lungs:  Clear, Bilateral. Right pectoral porta cath site. Cardiac:  Regular rhythm, normal S1 and S2, no S3.  Abdomen:  Soft, non-tender. Extremities:  No edema present. No cyanosis. No clubbing. CNS: AxOx3, Cranial nerves grossly intact, moves all 4 extremities. Right handed. Skin: Warm and dry.   Intake/Output from previous day: 05/26 0701 - 05/27 0700 In: 510 [P.O.:510] Out: -     Lab Results: BMET    Component Value Date/Time   NA 134* 03/16/2015 0528   NA 137 03/13/2015 0853   NA 138 03/06/2015 1049   NA 138 02/27/2015 1015   K 4.1 03/16/2015 0528   K 4.2 03/13/2015 0853   K 4.0 03/06/2015 1049   K 4.3 02/27/2015 1015   CL 100* 03/16/2015 0528   CO2 25 03/16/2015 0528   CO2 23 03/13/2015 0853   CO2 23 03/06/2015 1049   CO2 21* 02/27/2015 1015   GLUCOSE 100* 03/16/2015 0528   GLUCOSE 104 03/13/2015 0853   GLUCOSE 106 03/06/2015 1049   GLUCOSE 106 02/27/2015 1015   BUN 15 03/16/2015 0528   BUN 10.7  03/13/2015 0853   BUN 13.8 03/06/2015 1049   BUN 14.2 02/27/2015 1015   CREATININE 0.74 03/16/2015 0528   CREATININE 0.96 03/15/2015 1503   CREATININE 0.8 03/13/2015 0853   CREATININE 0.8 03/06/2015 1049   CREATININE 1.0 02/27/2015 1015   CREATININE 0.70 02/07/2015 1831   CALCIUM 8.9 03/16/2015 0528   CALCIUM 9.3 03/13/2015 0853   CALCIUM 9.3 03/06/2015 1049   CALCIUM 9.1 02/27/2015 1015   GFRNONAA >60 03/16/2015 0528   GFRNONAA 55* 03/15/2015 1503   GFRAA >60 03/16/2015 0528   GFRAA >60 03/15/2015 1503   CBC    Component Value Date/Time   WBC 4.2 03/16/2015 0528   WBC 4.5 03/13/2015 0852   RBC 3.74* 03/16/2015 0528   RBC 4.38 03/13/2015 0852   HGB 11.0* 03/16/2015 0528   HGB 13.0 03/13/2015 0852   HCT 32.2* 03/16/2015 0528   HCT 38.0 03/13/2015 0852   PLT 187 03/16/2015 0528   PLT 208 03/13/2015 0852   MCV 86.1 03/16/2015 0528   MCV 86.8 03/13/2015 0852   MCH 29.4 03/16/2015 0528   MCH 29.7 03/13/2015 0852   MCHC 34.2 03/16/2015 0528   MCHC 34.2 03/13/2015 0852   RDW 14.4 03/16/2015 0528   RDW 14.0 03/13/2015 0852   LYMPHSABS 0.9 03/13/2015 0852   MONOABS 0.5 03/13/2015 0852   EOSABS 0.0 03/13/2015 0852   BASOSABS  0.0 03/13/2015 0852   HEPATIC Function Panel  Recent Labs  02/27/15 1015 03/06/15 1049 03/13/15 0853  PROT 6.7 6.6 6.7   HEMOGLOBIN A1C No components found for: HGA1C,  MPG CARDIAC ENZYMES No results found for: CKTOTAL, CKMB, CKMBINDEX, TROPONINI BNP No results for input(s): PROBNP in the last 8760 hours. TSH No results for input(s): TSH in the last 8760 hours. CHOLESTEROL No results for input(s): CHOL in the last 8760 hours.  Scheduled Meds: . amLODipine  2.5 mg Oral q morning - 10a  . ARIPiprazole  5 mg Oral Daily  . clonazePAM  0.5 mg Oral BID  . gabapentin  300 mg Oral BID  . meclizine  12.5 mg Oral TID  . multivitamin with minerals  1 tablet Oral Daily  . pantoprazole  40 mg Oral Daily  . temazepam  15 mg Oral QHS   Continuous  Infusions:  PRN Meds:.acetaminophen **OR** acetaminophen, ondansetron, oxyCODONE  Assessment/Plan: Vasovagal syncope Head injury with bi-temporal SAH and left frontal ICH Severe anxiety S/P Right breast cancer with chemotherapy H/O hypertension  Appreciate neurology recommendations. Continue 24 hour supervision/bedside commode.  Day 1  Dixie Dials  MD  03/16/2015, 12:49 PM

## 2015-03-17 ENCOUNTER — Inpatient Hospital Stay (HOSPITAL_COMMUNITY): Payer: Medicare Other

## 2015-03-17 DIAGNOSIS — F411 Generalized anxiety disorder: Principal | ICD-10-CM

## 2015-03-17 DIAGNOSIS — S06369D Traumatic hemorrhage of cerebrum, unspecified, with loss of consciousness of unspecified duration, subsequent encounter: Secondary | ICD-10-CM

## 2015-03-17 DIAGNOSIS — E440 Moderate protein-calorie malnutrition: Secondary | ICD-10-CM | POA: Insufficient documentation

## 2015-03-17 NOTE — Progress Notes (Signed)
Subjective: No overnight events. No further falls. CT head imaging reviewed and shows slight interval increase in volume of SAH but overall appears stable.   Objective: Current vital signs: BP 121/60 mmHg  Pulse 82  Temp(Src) 98.4 F (36.9 C) (Oral)  Resp 16  Ht 5' (1.524 m)  Wt 49.6 kg (109 lb 5.6 oz)  BMI 21.36 kg/m2  SpO2 97% Vital signs in last 24 hours: Temp:  [97.9 F (36.6 C)-100.3 F (37.9 C)] 98.4 F (36.9 C) (05/28 0335) Pulse Rate:  [82-162] 82 (05/28 0335) Resp:  [14-20] 16 (05/28 0335) BP: (111-148)/(52-101) 121/60 mmHg (05/28 0335) SpO2:  [97 %-100 %] 97 % (05/28 0335)  Intake/Output from previous day: 05/27 0701 - 05/28 0700 In: 120 [P.O.:120] Out: -  Intake/Output this shift:   Nutritional status: Diet Heart Room service appropriate?: Yes; Fluid consistency:: Thin  Neurologic Exam: Neurologic Examination Mental Status: Alert, oriented, thought content appropriate. Speech fluent without evidence of aphasia.  Cranial Nerves: II: visual fields grossly normal, pupils equal, round, reactive to light  III,IV, VI: ptosis not present, extra-ocular motions intact bilaterally V,VII: smile symmetric, facial light touch sensation normal bilaterally Motor: Right :Upper extremityLeft: Upper extremity 5/5 deltoid 5/5 deltoid 5/5 biceps5/5 biceps  5/5 triceps5/5 triceps 5/5 hand grip5/5 hand grip Lower extremityLower extremity 5/5 hip flexor5/5 hip flexor 5/5  quadricep5/5 quadriceps  5/5 hamstrings5/5 hamstrings 5/5 plantar flexion 5/5 plantar flexion 5/5 plantar extension5/5 plantar extension Sensory: light touch intact throughout, bilaterally Deep Tendon Reflexes: 2+ and symmetric throughout Plantars: Right: downgoingLeft: downgoing Cerebellar: normal finger-to-nose, and normal heel-to-shin test Gait: deferred   Lab Results: Basic Metabolic Panel:  Recent Labs Lab 03/13/15 0853 03/15/15 1503 03/16/15 0528  NA 137  --  134*  K 4.2  --  4.1  CL  --   --  100*  CO2 23  --  25  GLUCOSE 104  --  100*  BUN 10.7  --  15  CREATININE 0.8 0.96 0.74  CALCIUM 9.3  --  8.9    Liver Function Tests:  Recent Labs Lab 03/13/15 0853  AST 18  ALT 18  ALKPHOS 67  BILITOT 0.33  PROT 6.7  ALBUMIN 4.0   No results for input(s): LIPASE, AMYLASE in the last 168 hours. No results for input(s): AMMONIA in the last 168 hours.  CBC:  Recent Labs Lab 03/13/15 0852 03/15/15 1503 03/16/15 0528  WBC 4.5 7.7 4.2  NEUTROABS 3.1  --   --   HGB 13.0 13.0 11.0*  HCT 38.0 37.9 32.2*  MCV 86.8 85.9 86.1  PLT 208 233 187    Cardiac Enzymes: No results for input(s): CKTOTAL, CKMB, CKMBINDEX, TROPONINI in the last 168 hours.  Lipid Panel: No results for input(s): CHOL, TRIG, HDL, CHOLHDL, VLDL, LDLCALC in the last 168 hours.  CBG:  Recent Labs Lab 03/16/15 0752  GLUCAP 138*    Microbiology: Results for orders placed or performed during the hospital encounter of 10/27/13  GC/Chlamydia Probe Amp     Status: None   Collection Time: 10/28/13 12:37 AM  Result Value Ref Range Status   CT Probe RNA NEGATIVE NEGATIVE Final   GC Probe RNA NEGATIVE NEGATIVE Final    Comment: (NOTE)                                                                                        **  Normal Reference Range: Negative**      Assay performed using the Gen-Probe APTIMA COMBO2 (R) Assay. Acceptable specimen types for this assay include APTIMA Swabs (Unisex, endocervical, urethral, or vaginal), first void urine, and ThinPrep liquid based cytology samples. Performed at [facility] prep, genital     Status: Abnormal   Collection Time: 10/28/13 12:37 AM  Result Value Ref Range Status   Yeast Wet Prep HPF POC NONE SEEN NONE SEEN Final   Trich, Wet Prep NONE SEEN NONE SEEN Final   Clue Cells Wet Prep HPF POC RARE (A) NONE SEEN Final   WBC, Wet Prep HPF POC MODERATE (A) NONE SEEN Final    Coagulation Studies: No results for input(s): LABPROT, INR in the last 72 hours.  Imaging: Ct Head Wo Contrast  03/17/2015   CLINICAL DATA:  Followup intracranial hemorrhage  EXAM: CT HEAD WITHOUT CONTRAST  TECHNIQUE: Contiguous axial images were obtained from the base of the skull through the vertex without intravenous contrast.  COMPARISON:  03/16/2015  FINDINGS: Slight increase in subarachnoid hemorrhage. Stable degree of mild cortical volume loss. No midline shift. No mass or infarct identified. No skull fracture. Orbits and paranasal sinuses are intact.  IMPRESSION: Slight interval increase in volume overall of acute subarachnoid hemorrhage as reported previously. No midline shift or mass effect. These results were called by telephone at the time of interpretation on 03/17/2015 at 7:56 am to [place] for Dr. Jim Like , who verbally acknowledged these results.   Electronically Signed   By: Conchita Paris M.D.   On: 03/17/2015 07:58   Ct Head Wo Contrast  03/16/2015   CLINICAL DATA:  Pain following fall; transient loss of consciousness  EXAM: CT HEAD WITHOUT CONTRAST  TECHNIQUE: Contiguous axial images were obtained from the base of the skull through the vertex without intravenous contrast.   COMPARISON:  None.  FINDINGS: There is mild diffuse atrophy. There is focal hemorrhage in the periphery of the left frontal lobe. This hemorrhage appears intraparenchymal as opposed to subdural, although there may be minimal subdural fluid in this area as well. There is also a small amount of subarachnoid hemorrhage in the right superior temporal sulci region. More subtle subarachnoid hemorrhage is noted in the left temporal lobe.  There is no mass or midline shift. No extra-axial fluid collections are identified beyond an equivocal minimal left frontal subdural hematoma. There is mild periventricular small vessel disease. No acute infarct evident. The bony calvarium appears intact. The mastoid air cells are clear.  IMPRESSION: There are foci of subarachnoid hemorrhage in each temporal lobe. There is hemorrhage in the periphery of the inferior left frontal lobe consistent with localized hemorrhagic contusion. There may be an the minimal subdural hematoma in this area. There is no mass effect or midline shift. No edema seen. No acute infarct apparent. There is mild underlying atrophy.  Critical Value/emergent results were called by telephone at the time of interpretation on 03/16/2015 at 8:26 am to Dr. Dixie Dials , who verbally acknowledged these results.   Electronically Signed   By: Lowella Grip III M.D.   On: 03/16/2015 08:26    Medications:  Scheduled: . amLODipine  2.5 mg Oral q morning - 10a  . ARIPiprazole  5 mg Oral Daily  . clonazePAM  0.5 mg Oral BID  . feeding supplement (ENSURE ENLIVE)  237 mL Oral BID BM  . gabapentin  300 mg Oral BID  . meclizine  12.5 mg Oral TID  .  multivitamin with minerals  1 tablet Oral Daily  . pantoprazole  40 mg Oral Daily  . temazepam  15 mg Oral QHS    Assessment/Plan:  80y/o woman admitted with anxiety and palpitations, hospital course complicated by transient LOC and head trauma while in the bathroom. Period of LOC appears consistent with a vasovagal  syncope.[facility] Head imaging shows small bi-temporal SAH and left frontal ICH. This is in a non-aneurysmal distribution and appears most consistent with a traumatic injury. Repeat imaging shows slight progression but overall stable. Patient is currently asymptomatic.   -hold antiplatelet and anticoagulation. Will d/c heparin and switch to SCDs -encourage PO intake and IV hydration for orthostatic hypotension -no further neurological workup indicated at this time    LOS: 1 day   Jim Like, DO [facility] [phone]  If 7pm- 7am, please page neurology on call as listed in [place]. 03/17/2015  8:56 AM

## 2015-03-17 NOTE — Progress Notes (Addendum)
Ref: Birdie Riddle, MD   Subjective:  Resting better. CT head stable per neurology. Malnutrition noted. Afebrile.  Objective:  Vital Signs in the last 24 hours: Temp:  [97.9 F (36.6 C)-100.3 F (37.9 C)] 98.4 F (36.9 C) (05/28 0335) Pulse Rate:  [82-162] 82 (05/28 0335) Cardiac Rhythm:  [-] Normal sinus rhythm (05/28 0815) Resp:  [14-20] 16 (05/28 0335) BP: (111-148)/(52-101) 121/60 mmHg (05/28 0335) SpO2:  [97 %-100 %] 97 % (05/28 0335)  Physical Exam: BP Readings from Last 1 Encounters:  03/17/15 121/60    Wt Readings from Last 1 Encounters:  03/15/15 49.6 kg (109 lb 5.6 oz)    Weight change:   HEENT: [place]/AT, Eyes-Blue, PERL, EOMI, Conjunctiva-Pink, Sclera-Non-icteric. Occipital area hematoma. Neck: No JVD, No bruit, Trachea midline. Lungs:  Clear, Bilateral. Cardiac:  Regular rhythm, normal S1 and S2, no S3.  Abdomen:  Soft, non-tender. Extremities:  No edema present. No cyanosis. No clubbing. CNS: AxOx3, Cranial nerves grossly intact, moves all 4 extremities. Right handed. Skin: Warm and dry.   Intake/Output from previous day: 05/27 0701 - 05/28 0700 In: 120 [P.O.:120] Out: -     Lab Results: BMET    Component Value Date/Time   NA 134* 03/16/2015 0528   NA 137 03/13/2015 0853   NA 138 03/06/2015 1049   NA 138 02/27/2015 1015   K 4.1 03/16/2015 0528   K 4.2 03/13/2015 0853   K 4.0 03/06/2015 1049   K 4.3 02/27/2015 1015   CL 100* 03/16/2015 0528   CO2 25 03/16/2015 0528   CO2 23 03/13/2015 0853   CO2 23 03/06/2015 1049   CO2 21* 02/27/2015 1015   GLUCOSE 100* 03/16/2015 0528   GLUCOSE 104 03/13/2015 0853   GLUCOSE 106 03/06/2015 1049   GLUCOSE 106 02/27/2015 1015   BUN 15 03/16/2015 0528   BUN 10.7 03/13/2015 0853   BUN 13.8 03/06/2015 1049   BUN 14.2 02/27/2015 1015   CREATININE 0.74 03/16/2015 0528   CREATININE 0.96 03/15/2015 1503   CREATININE 0.8 03/13/2015 0853   CREATININE 0.8 03/06/2015 1049   CREATININE 1.0 02/27/2015 1015   CREATININE 0.70 02/07/2015 1831   CALCIUM 8.9 03/16/2015 0528   CALCIUM 9.3 03/13/2015 0853   CALCIUM 9.3 03/06/2015 1049   CALCIUM 9.1 02/27/2015 1015   GFRNONAA >60 03/16/2015 0528   GFRNONAA 55* 03/15/2015 1503   GFRAA >60 03/16/2015 0528   GFRAA >60 03/15/2015 1503   CBC    Component Value Date/Time   WBC 4.2 03/16/2015 0528   WBC 4.5 03/13/2015 0852   RBC 3.74* 03/16/2015 0528   RBC 4.38 03/13/2015 0852   HGB 11.0* 03/16/2015 0528   HGB 13.0 03/13/2015 0852   HCT 32.2* 03/16/2015 0528   HCT 38.0 03/13/2015 0852   PLT 187 03/16/2015 0528   PLT 208 03/13/2015 0852   MCV 86.1 03/16/2015 0528   MCV 86.8 03/13/2015 0852   MCH 29.4 03/16/2015 0528   MCH 29.7 03/13/2015 0852   MCHC 34.2 03/16/2015 0528   MCHC 34.2 03/13/2015 0852   RDW 14.4 03/16/2015 0528   RDW 14.0 03/13/2015 0852   LYMPHSABS 0.9 03/13/2015 0852   MONOABS 0.5 03/13/2015 0852   EOSABS 0.0 03/13/2015 0852   BASOSABS 0.0 03/13/2015 0852   HEPATIC Function Panel  Recent Labs  02/27/15 1015 03/06/15 1049 03/13/15 0853  PROT 6.7 6.6 6.7   HEMOGLOBIN A1C No components found for: HGA1C,  MPG CARDIAC ENZYMES No results found for: CKTOTAL, CKMB, CKMBINDEX, TROPONINI BNP No  results for input(s): PROBNP in the last 8760 hours. TSH No results for input(s): TSH in the last 8760 hours. CHOLESTEROL No results for input(s): CHOL in the last 8760 hours.  Scheduled Meds: . amLODipine  2.5 mg Oral q morning - 10a  . ARIPiprazole  5 mg Oral Daily  . clonazePAM  0.5 mg Oral BID  . feeding supplement (ENSURE ENLIVE)  237 mL Oral BID BM  . gabapentin  300 mg Oral BID  . meclizine  12.5 mg Oral TID  . multivitamin with minerals  1 tablet Oral Daily  . pantoprazole  40 mg Oral Daily  . temazepam  15 mg Oral QHS   Continuous Infusions:  PRN Meds:.acetaminophen **OR** acetaminophen, ondansetron, oxyCODONE  Assessment/Plan: Vasovagal syncope Head injury with bitemporal and left frontal subarachnoid  hemorrhage. Severe anxiety S/P Right breast cancer with chemotherapy H/O hypertension Moderate malnutrition  Continue medical treatment. PT and OT consult     LOS: 1 day    Dixie Dials  MD  03/17/2015, 9:55 AM

## 2015-03-17 NOTE — Progress Notes (Signed)
Dr. Doylene Canard called and made aware of CT results. Kathleen Argue S 8:21 AM

## 2015-03-18 LAB — CBC
HCT: 30.4 % — ABNORMAL LOW (ref 36.0–46.0)
Hemoglobin: 10.1 g/dL — ABNORMAL LOW (ref 12.0–15.0)
MCH: 29 pg (ref 26.0–34.0)
MCHC: 33.2 g/dL (ref 30.0–36.0)
MCV: 87.4 fL (ref 78.0–100.0)
Platelets: 138 10*3/uL — ABNORMAL LOW (ref 150–400)
RBC: 3.48 MIL/uL — AB (ref 3.87–5.11)
RDW: 14.4 % (ref 11.5–15.5)
WBC: 5.6 10*3/uL (ref 4.0–10.5)

## 2015-03-18 LAB — BASIC METABOLIC PANEL
Anion gap: 6 (ref 5–15)
BUN: 13 mg/dL (ref 6–20)
CHLORIDE: 101 mmol/L (ref 101–111)
CO2: 25 mmol/L (ref 22–32)
Calcium: 8.6 mg/dL — ABNORMAL LOW (ref 8.9–10.3)
Creatinine, Ser: 0.77 mg/dL (ref 0.44–1.00)
GFR calc Af Amer: >60 mL/min (ref >60)
GFR calc non Af Amer: >60 mL/min (ref >60)
GLUCOSE: 116 mg/dL — AB (ref 65–99)
Potassium: 3.8 mmol/L (ref 3.5–5.1)
Sodium: 132 mmol/L — ABNORMAL LOW (ref 135–145)

## 2015-03-18 MED ORDER — CLONAZEPAM 0.5 MG PO TABS
0.2500 mg | ORAL_TABLET | Freq: Two times a day (BID) | ORAL | Status: DC
  Administered 2015-03-18 – 2015-03-22 (×8): 0.25 mg via ORAL
  Filled 2015-03-18 (×8): qty 1

## 2015-03-18 NOTE — Evaluation (Signed)
Physical Therapy Evaluation Patient Details Name: Grace Paul MRN: [id number] DOB: 11/30/1934 Today's Date: 03/18/2015   History of Present Illness  Pt adm for increased symptoms of depression , anxiety , passive suicidal thoughts , restlessness, insomnia. While in hospital pt passed out and fell from commode and sufferred small bi-temporal SAH and left frontal ICH. PMH - breast CA (recent chemo), anxiety, post traumatic stress,   Clinical Impression  Pt admitted with above diagnosis and presents to PT with functional limitations due to deficits listed below (See PT problem list). Pt needs skilled PT to maximize independence and safety to allow discharge to least restrictive environment. Pt unsteady and impulsive. Able to state she is unsteady but attempts to get up and move quickly anyway. Unsure if this is due to her head injury or her psych issues. If she remains this unsteady feel she may need ST-SNF. Also recommend OT consult.     Follow Up Recommendations SNF (unless improves quickly or has assist at home.)    Equipment Recommendations       Recommendations for Other Services OT consult     Precautions / Restrictions Precautions Precautions: Fall Precaution Comments: on suicide precautions Restrictions Weight Bearing Restrictions: No      Mobility  Bed Mobility Overal bed mobility: Independent                Transfers Overall transfer level: Needs assistance Equipment used: None Transfers: Sit to/from Stand Sit to Stand: Min guard         General transfer comment: Assist for balance  Ambulation/Gait Ambulation/Gait assistance: Min assist Ambulation Distance (Feet): 400 Feet Assistive device: None;1 person hand held assist Gait Pattern/deviations: Drifts right/left;Step-through pattern     General Gait Details: Pt with unsteady gait and weaving side to side in hall. Pt with slight limp due to leg length discrepancy  Stairs            Wheelchair  Mobility    Modified Rankin (Stroke Patients Only)       Balance Overall balance assessment: Needs assistance Sitting-balance support: No upper extremity supported;Feet supported Sitting balance-Leahy Scale: Normal     Standing balance support: No upper extremity supported;During functional activity Standing balance-Leahy Scale: Fair                               Pertinent Vitals/Pain Pain Assessment: No/denies pain    Home Living Family/patient expects to be discharged to:: Private residence Living Arrangements: Alone   Type of Home: House Home Access: Stairs to enter Entrance Stairs-Rails: None Entrance Stairs-Number of Steps: 3-4 Home Layout: One level Home Equipment: None      Prior Function Level of Independence: Independent               Hand Dominance        Extremity/Trunk Assessment   Upper Extremity Assessment: Defer to OT evaluation           Lower Extremity Assessment: Overall WFL for tasks assessed         Communication      Cognition Arousal/Alertness: Awake/alert Behavior During Therapy: Impulsive Overall Cognitive Status: Impaired/Different from baseline Area of Impairment: Attention;Safety/judgement   Current Attention Level: Sustained Memory: Decreased recall of precautions   Safety/Judgement: Decreased awareness of safety;Decreased awareness of deficits     General Comments: Verbal/tactile cues x 4 for pt to stay seated at EOB while getting her 2nd gown ready and  on.    General Comments      Exercises        Assessment/Plan    PT Assessment Patient needs continued PT services  PT Diagnosis Difficulty walking;Abnormality of gait   PT Problem List Decreased balance;Decreased mobility;Decreased cognition;Decreased safety awareness;Decreased knowledge of precautions  PT Treatment Interventions DME instruction;Gait training;Functional mobility training;Therapeutic activities;Balance training;Cognitive  remediation;Patient/family education   PT Goals (Current goals can be found in the Care Plan section) Acute Rehab PT Goals Patient Stated Goal: get anxiety better PT Goal Formulation: With patient Time For Goal Achievement: Mar 29, 2015 Potential to Achieve Goals: Good    Frequency Min 3X/week   Barriers to discharge Decreased caregiver support      Co-evaluation               End of Session Equipment Utilized During Treatment: Gait belt Activity Tolerance: Patient tolerated treatment well Patient left: in bed;with nursing/sitter in room Freight forwarder) Nurse Communication: Mobility status         Time: 1116-1130 PT Time Calculation (min) (ACUTE ONLY): 14 min   Charges:   PT Evaluation $Initial PT Evaluation Tier I: 1 Procedure     PT G Codes:        Brigitte Soderberg March 29, 2015, 12:39 PM  [facility] PT [phone]

## 2015-03-18 NOTE — Progress Notes (Signed)
Ref: Keta Vanvalken[place] S, MD   Subjective:  Feeling better but sleepy all day.  Objective:  Vital Signs in the last 24 hours: Temp:  [98.7 F (37.1 C)-99.1 F (37.3 C)] 98.7 F (37.1 C) (05/29 1253) Pulse Rate:  [84-88] 88 (05/29 1253) Cardiac Rhythm:  [-] Normal sinus rhythm (05/29 0900) Resp:  [16] 16 (05/29 1253) BP: (116-126)/(54-55) 126/54 mmHg (05/29 1253) SpO2:  [96 %-98 %] 98 % (05/29 1253) Weight:  [49.896 kg (110 lb)] 49.896 kg (110 lb) (05/29 0449)  Physical Exam: BP Readings from Last 1 Encounters:  03/18/15 126/54    Wt Readings from Last 1 Encounters:  03/18/15 49.896 kg (110 lb)    Weight change:   HEENT: [place]/AT, Eyes-Blue, PERL, EOMI, Conjunctiva-Pink, Sclera-Non-icteric. Occipital hematoma. Neck: No JVD, No bruit, Trachea midline. Lungs:  Clear, Bilateral. Cardiac:  Regular rhythm, normal S1 and S2, no S3.  Abdomen:  Soft, non-tender. Extremities:  No edema present. No cyanosis. No clubbing. CNS: AxOx3, Cranial nerves grossly intact, moves all 4 extremities. Right handed. Skin: Warm and dry.   Intake/Output from previous day: 05/28 0701 - 05/29 0700 In: 240 [P.O.:240] Out: -     Lab Results: BMET    Component Value Date/Time   NA 132* 03/18/2015 0439   NA 134* 03/16/2015 0528   NA 137 03/13/2015 0853   NA 138 03/06/2015 1049   NA 138 02/27/2015 1015   K 3.8 03/18/2015 0439   K 4.1 03/16/2015 0528   K 4.2 03/13/2015 0853   K 4.0 03/06/2015 1049   K 4.3 02/27/2015 1015   CL 101 03/18/2015 0439   CL 100* 03/16/2015 0528   CO2 25 03/18/2015 0439   CO2 25 03/16/2015 0528   CO2 23 03/13/2015 0853   CO2 23 03/06/2015 1049   CO2 21* 02/27/2015 1015   GLUCOSE 116* 03/18/2015 0439   GLUCOSE 100* 03/16/2015 0528   GLUCOSE 104 03/13/2015 0853   GLUCOSE 106 03/06/2015 1049   GLUCOSE 106 02/27/2015 1015   BUN 13 03/18/2015 0439   BUN 15 03/16/2015 0528   BUN 10.7 03/13/2015 0853   BUN 13.8 03/06/2015 1049   BUN 14.2 02/27/2015 1015   CREATININE  0.77 03/18/2015 0439   CREATININE 0.74 03/16/2015 0528   CREATININE 0.96 03/15/2015 1503   CREATININE 0.8 03/13/2015 0853   CREATININE 0.8 03/06/2015 1049   CREATININE 1.0 02/27/2015 1015   CALCIUM 8.6* 03/18/2015 0439   CALCIUM 8.9 03/16/2015 0528   CALCIUM 9.3 03/13/2015 0853   CALCIUM 9.3 03/06/2015 1049   CALCIUM 9.1 02/27/2015 1015   GFRNONAA >60 03/18/2015 0439   GFRNONAA >60 03/16/2015 0528   GFRNONAA 55* 03/15/2015 1503   GFRAA >60 03/18/2015 0439   GFRAA >60 03/16/2015 0528   GFRAA >60 03/15/2015 1503   CBC    Component Value Date/Time   WBC 5.6 03/18/2015 0439   WBC 4.5 03/13/2015 0852   RBC 3.48* 03/18/2015 0439   RBC 4.38 03/13/2015 0852   HGB 10.1* 03/18/2015 0439   HGB 13.0 03/13/2015 0852   HCT 30.4* 03/18/2015 0439   HCT 38.0 03/13/2015 0852   PLT 138* 03/18/2015 0439   PLT 208 03/13/2015 0852   MCV 87.4 03/18/2015 0439   MCV 86.8 03/13/2015 0852   MCH 29.0 03/18/2015 0439   MCH 29.7 03/13/2015 0852   MCHC 33.2 03/18/2015 0439   MCHC 34.2 03/13/2015 0852   RDW 14.4 03/18/2015 0439   RDW 14.0 03/13/2015 0852   LYMPHSABS 0.9 03/13/2015 9629  MONOABS 0.5 03/13/2015 0852   EOSABS 0.0 03/13/2015 0852   BASOSABS 0.0 03/13/2015 0852   HEPATIC Function Panel  Recent Labs  02/27/15 1015 03/06/15 1049 03/13/15 0853  PROT 6.7 6.6 6.7   HEMOGLOBIN A1C No components found for: HGA1C,  MPG CARDIAC ENZYMES No results found for: CKTOTAL, CKMB, CKMBINDEX, TROPONINI BNP No results for input(s): PROBNP in the last 8760 hours. TSH No results for input(s): TSH in the last 8760 hours. CHOLESTEROL No results for input(s): CHOL in the last 8760 hours.  Scheduled Meds: . amLODipine  2.5 mg Oral q morning - 10a  . ARIPiprazole  5 mg Oral Daily  . clonazePAM  0.25 mg Oral BID  . feeding supplement (ENSURE ENLIVE)  237 mL Oral BID BM  . gabapentin  300 mg Oral BID  . meclizine  12.5 mg Oral TID  . multivitamin with minerals  1 tablet Oral Daily  .  pantoprazole  40 mg Oral Daily   Continuous Infusions:  PRN Meds:.acetaminophen **OR** acetaminophen, ondansetron, oxyCODONE  Assessment/Plan: Vasovagal syncope Head injury with bitemporal and left frontal subarachnoid hemorrhage. Severe anxiety S/P Right breast cancer with chemotherapy H/O hypertension Moderate malnutrition  Decrease Clonazepam by 50 %. Increase activity. Consider SNF x 1-2 weeks.     LOS: 2 days    Dixie Dials  MD  03/18/2015, 5:37 PM

## 2015-03-19 MED ORDER — NEOMYCIN-POLYMYXIN-HC 1 % OT SOLN
2.0000 [drp] | Freq: Four times a day (QID) | OTIC | Status: DC
  Administered 2015-03-19 – 2015-03-22 (×13): 2 [drp] via OTIC
  Filled 2015-03-19: qty 10

## 2015-03-19 NOTE — Progress Notes (Signed)
Ref: Birdie Riddle, MD   Subjective:  Less sleepy. Eating has improved per patient.  Objective:  Vital Signs in the last 24 hours: Temp:  [98.9 F (37.2 C)-99.7 F (37.6 C)] 98.9 F (37.2 C) (05/30 1520) Pulse Rate:  [83-87] 85 (05/30 1520) Cardiac Rhythm:  [-] Normal sinus rhythm (05/30 0810) Resp:  [18] 18 (05/30 1520) BP: (116-129)/(52-58) 127/55 mmHg (05/30 1520) SpO2:  [96 %] 96 % (05/30 1520) Weight:  [50.349 kg (111 lb)] 50.349 kg (111 lb) (05/30 0404)  Physical Exam: BP Readings from Last 1 Encounters:  03/19/15 127/55    Wt Readings from Last 1 Encounters:  03/19/15 50.349 kg (111 lb)    Weight change: 0.454 kg (1 lb)  HEENT: [place]/AT, Eyes-Blue, PERL, EOMI, Conjunctiva-Pink, Sclera-Non-icteric. Occipital swelling decreasing. Neck: No JVD, No bruit, Trachea midline. Lungs:  Clear, Bilateral. Cardiac:  Regular rhythm, normal S1 and S2, no S3.  Abdomen:  Soft, non-tender. Extremities:  No edema present. No cyanosis. No clubbing. CNS: AxOx3, Cranial nerves grossly intact, moves all 4 extremities. Right handed. Skin: Warm and dry.   Intake/Output from previous day: 05/29 0701 - 05/30 0700 In: 360 [P.O.:360] Out: -     Lab Results: BMET    Component Value Date/Time   NA 132* 03/18/2015 0439   NA 134* 03/16/2015 0528   NA 137 03/13/2015 0853   NA 138 03/06/2015 1049   NA 138 02/27/2015 1015   K 3.8 03/18/2015 0439   K 4.1 03/16/2015 0528   K 4.2 03/13/2015 0853   K 4.0 03/06/2015 1049   K 4.3 02/27/2015 1015   CL 101 03/18/2015 0439   CL 100* 03/16/2015 0528   CO2 25 03/18/2015 0439   CO2 25 03/16/2015 0528   CO2 23 03/13/2015 0853   CO2 23 03/06/2015 1049   CO2 21* 02/27/2015 1015   GLUCOSE 116* 03/18/2015 0439   GLUCOSE 100* 03/16/2015 0528   GLUCOSE 104 03/13/2015 0853   GLUCOSE 106 03/06/2015 1049   GLUCOSE 106 02/27/2015 1015   BUN 13 03/18/2015 0439   BUN 15 03/16/2015 0528   BUN 10.7 03/13/2015 0853   BUN 13.8 03/06/2015 1049   BUN 14.2  02/27/2015 1015   CREATININE 0.77 03/18/2015 0439   CREATININE 0.74 03/16/2015 0528   CREATININE 0.96 03/15/2015 1503   CREATININE 0.8 03/13/2015 0853   CREATININE 0.8 03/06/2015 1049   CREATININE 1.0 02/27/2015 1015   CALCIUM 8.6* 03/18/2015 0439   CALCIUM 8.9 03/16/2015 0528   CALCIUM 9.3 03/13/2015 0853   CALCIUM 9.3 03/06/2015 1049   CALCIUM 9.1 02/27/2015 1015   GFRNONAA >60 03/18/2015 0439   GFRNONAA >60 03/16/2015 0528   GFRNONAA 55* 03/15/2015 1503   GFRAA >60 03/18/2015 0439   GFRAA >60 03/16/2015 0528   GFRAA >60 03/15/2015 1503   CBC    Component Value Date/Time   WBC 5.6 03/18/2015 0439   WBC 4.5 03/13/2015 0852   RBC 3.48* 03/18/2015 0439   RBC 4.38 03/13/2015 0852   HGB 10.1* 03/18/2015 0439   HGB 13.0 03/13/2015 0852   HCT 30.4* 03/18/2015 0439   HCT 38.0 03/13/2015 0852   PLT 138* 03/18/2015 0439   PLT 208 03/13/2015 0852   MCV 87.4 03/18/2015 0439   MCV 86.8 03/13/2015 0852   MCH 29.0 03/18/2015 0439   MCH 29.7 03/13/2015 0852   MCHC 33.2 03/18/2015 0439   MCHC 34.2 03/13/2015 0852   RDW 14.4 03/18/2015 0439   RDW 14.0 03/13/2015 0852   LYMPHSABS  0.9 03/13/2015 0852   MONOABS 0.5 03/13/2015 0852   EOSABS 0.0 03/13/2015 0852   BASOSABS 0.0 03/13/2015 0852   HEPATIC Function Panel  Recent Labs  02/27/15 1015 03/06/15 1049 03/13/15 0853  PROT 6.7 6.6 6.7   HEMOGLOBIN A1C No components found for: HGA1C,  MPG CARDIAC ENZYMES No results found for: CKTOTAL, CKMB, CKMBINDEX, TROPONINI BNP No results for input(s): PROBNP in the last 8760 hours. TSH No results for input(s): TSH in the last 8760 hours. CHOLESTEROL No results for input(s): CHOL in the last 8760 hours.  Scheduled Meds: . amLODipine  2.5 mg Oral q morning - 10a  . ARIPiprazole  5 mg Oral Daily  . clonazePAM  0.25 mg Oral BID  . feeding supplement (ENSURE ENLIVE)  237 mL Oral BID BM  . gabapentin  300 mg Oral BID  . meclizine  12.5 mg Oral TID  . multivitamin with minerals  1  tablet Oral Daily  . NEOMYCIN-POLYMYXIN-HYDROCORTISONE  2 drop Both Ears 4 times per day  . pantoprazole  40 mg Oral Daily   Continuous Infusions:  PRN Meds:.acetaminophen **OR** acetaminophen, ondansetron, oxyCODONE  Assessment/Plan: Vasovagal syncope Head injury with bitemporal and left frontal subarachnoid hemorrhage. Severe anxiety S/P Right breast cancer with chemotherapy H/O hypertension Moderate malnutrition  Continue PT/OT.  Awaiting SNF placement for PT/OT     LOS: 3 days    Dixie Dials  MD  03/19/2015, 5:20 PM
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
## 2015-03-19 NOTE — Progress Notes (Signed)
Physical Therapy Treatment Patient Details Name: Grace Paul MRN: [id number] DOB: 1935/04/23 Today's Date: 24-Mar-2015    History of Present Illness Pt adm for increased symptoms of depression , anxiety , passive suicidal thoughts , restlessness, insomnia. While in hospital pt passed out and fell from commode and sufferred small bi-temporal SAH and left frontal ICH. PMH - breast CA (recent chemo), anxiety, post traumatic stress,     PT Comments    Pt continues to have unsteady gait.   Follow Up Recommendations  SNF     Equipment Recommendations       Recommendations for Other Services OT consult     Precautions / Restrictions Precautions Precautions: Fall Precaution Comments: on suicide precautions Restrictions Weight Bearing Restrictions: No    Mobility  Bed Mobility Overal bed mobility: Independent                Transfers Overall transfer level: Needs assistance Equipment used: None Transfers: Sit to/from Stand Sit to Stand: Supervision         General transfer comment: Assist for balance  Ambulation/Gait Ambulation/Gait assistance: Min guard;Min assist Ambulation Distance (Feet): 400 Feet Assistive device: None Gait Pattern/deviations: Step-through pattern;Decreased stride length;Drifts right/left (with head turns or distractions)     General Gait Details: Pt with guarded gait with rigid trunk and neck and little trunk rotation. When pt looks to either side or attends environmental distractions pt requires min A for balance.   Stairs            Wheelchair Mobility    Modified Rankin (Stroke Patients Only)       Balance Overall balance assessment: Needs assistance Sitting-balance support: No upper extremity supported Sitting balance-Leahy Scale: Normal     Standing balance support: No upper extremity supported;During functional activity Standing balance-Leahy Scale: Fair                      Cognition Arousal/Alertness:  Awake/alert Behavior During Therapy: Impulsive Overall Cognitive Status: Impaired/Different from baseline Area of Impairment: Attention;Safety/judgement Orientation Level: Disoriented to;Time       Safety/Judgement: Decreased awareness of safety;Decreased awareness of deficits   Problem Solving: Slow processing      Exercises      General Comments        Pertinent Vitals/Pain Pain Assessment: No/denies pain    Home Living Family/patient expects to be discharged to:: Private residence Living Arrangements: Alone Available Help at Discharge: Family Type of Home: House Home Access: Stairs to enter Entrance Stairs-Rails: None Home Layout: One level Home Equipment: None      Prior Function Level of Independence: Independent          PT Goals (current goals can now be found in the care plan section) Acute Rehab PT Goals Patient Stated Goal: get anxiety better PT Goal Formulation: With patient Time For Goal Achievement: 03/18/15 Potential to Achieve Goals: Good    Frequency  Min 3X/week    PT Plan      Co-evaluation             End of Session   Activity Tolerance: Patient tolerated treatment well Patient left: in bed;with nursing/sitter in room (sitter)     Time: 1452-1500 PT Time Calculation (min) (ACUTE ONLY): 8 min  Charges:  $Gait Training: 8-22 mins                    G Codes:      [place] 24-Mar-2015, 3:47 PM  [employer]  Horace
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
## 2015-03-20 DIAGNOSIS — R45851 Suicidal ideations: Secondary | ICD-10-CM

## 2015-03-20 MED ORDER — ARIPIPRAZOLE 15 MG PO TABS
7.5000 mg | ORAL_TABLET | Freq: Every day | ORAL | Status: DC
  Administered 2015-03-21 – 2015-03-22 (×2): 7.5 mg via ORAL
  Filled 2015-03-20 (×2): qty 1

## 2015-03-20 MED ORDER — GABAPENTIN 300 MG PO CAPS
300.0000 mg | ORAL_CAPSULE | Freq: Three times a day (TID) | ORAL | Status: DC
  Administered 2015-03-20 – 2015-03-22 (×7): 300 mg via ORAL
  Filled 2015-03-20 (×8): qty 1

## 2015-03-20 NOTE — Progress Notes (Signed)
Chaplain follow-up   Explored Anger and Anxiety: You seem anxious. Would you share those emotions with me?  Explored control and trust issues: Listen to [facility]   03/20/15 1700  Clinical Encounter Type  Visited With Patient;Health care provider  Visit Type Follow-up;Psychological support;Spiritual support;Behavioral Health  Referral From Nurse  Consult/Referral To Chaplain  Spiritual Encounters  Spiritual Needs Emotional   reflect upon parameters of control and trust in significant relationships to achieve healthy equilibrium.   Explored ethical considerations to help her identify and evaluate possible decision or actions and determine which among them would be best or right for her.  Rossi would like some lip gloss or lip stick, and maybe walk about.  She would also like to continue to talk with a Chaplain or spiritual person along with a psychiatrists. She said, " talking with someone about what is in her head is helpful to her overall health."  Will follow-up

## 2015-03-20 NOTE — Clinical Social Work Note (Signed)
Clinical Social Work Assessment  Patient Details  Name: Grace Paul MRN: [id number] Date of Birth: Feb 09, 1935  Date of referral:  03/20/15               Reason for consult:  Emotional/Coping/Adjustment to Illness                Permission sought to share information with:  Psychiatrist Permission granted to share information::  Yes, Verbal Permission Granted  Name::        Agency::     Relationship::     Contact Information:     Housing/Transportation Living arrangements for the past 2 months:  Single Family Home Source of Information:  Patient Patient Interpreter Needed:  None Criminal Activity/Legal Involvement Pertinent to Current Situation/Hospitalization:  No - Comment as needed Significant Relationships:  None Lives with:    Do you feel safe going back to the place where you live?  Yes Need for family participation in patient care:  No (Coment)  Care giving concerns:  Pt lives alone   Facilities manager / plan: discussed SNF  Employment status:  Retired Forensic scientist:  Medicare PT Recommendations:  [place] / Referral to community resources:  [place]  Patient/Family's Response to care: pt somewhat agreeable but prefers psychiatric placement  Patient/Family's Understanding of and Emotional Response to Diagnosis, Current Treatment, and Prognosis:  unclear  Emotional Assessment Appearance:  Appears stated age, Disheveled Attitude/Demeanor/Rapport:  Crying, Irrational, Suspicious Affect (typically observed):  Calm Orientation:  Oriented to Self, Oriented to Place, Oriented to  Time, Oriented to Situation Alcohol / Substance use:  Not Applicable Psych involvement (Current and /or in the community):  Yes (Comment)  Discharge Needs  Concerns to be addressed:  Mental Health Concerns, Decision making concerns, Home Safety Concerns, Coping/Stress Concerns Readmission within the last 30 days:  No Current  discharge risk:  Psychiatric Illness, Physical Impairment Barriers to Discharge:  Requiring sitter/restraints, Continued Medical Work up   [employer], LCSW 03/20/2015, 12:11 PM

## 2015-03-20 NOTE — Progress Notes (Signed)
Close friend of patient approached me concerning establishment of healthcare POA. This information relayed to psych treatment team. Pastoral care also aware of friends request.

## 2015-03-20 NOTE — Progress Notes (Signed)
Ref: Birdie Riddle, MD   Subjective:  Currently not a candidate for inpatient psychiatric treatment. Feels 50 % improved. Needs help few weeks before being strong enough to return home. Has poor supervision at home for now.  Objective:  Vital Signs in the last 24 hours: Temp:  [98.9 F (37.2 C)-100 F (37.8 C)] 100 F (37.8 C) (05/31 1511) Pulse Rate:  [74-89] 89 (05/31 1511) Cardiac Rhythm:  [-] Normal sinus rhythm (05/31 0800) Resp:  [18] 18 (05/31 1511) BP: (115-145)/(47-54) 145/51 mmHg (05/31 1511) SpO2:  [97 %-99 %] 99 % (05/31 1511) Weight:  [50.5 kg (111 lb 5.3 oz)] 50.5 kg (111 lb 5.3 oz) (05/31 0510)  Physical Exam: BP Readings from Last 1 Encounters:  03/20/15 145/51    Wt Readings from Last 1 Encounters:  03/20/15 50.5 kg (111 lb 5.3 oz)    Weight change: 0.151 kg (5.3 oz)  HEENT: [place]/AT, Eyes-Blue, PERL, EOMI, Conjunctiva-Pink, Sclera-Non-icteric.  Neck: No JVD, No bruit, Trachea midline. Base of skull and neck tenderness. Lungs:  Clear, Bilateral. Cardiac:  Regular rhythm, normal S1 and S2, no S3.  Abdomen:  Soft, non-tender. Extremities:  No edema present. No cyanosis. No clubbing. CNS: AxOx3, Cranial nerves grossly intact, moves all 4 extremities. Right handed. Skin: Warm and dry.   Intake/Output from previous day: 05/30 0701 - 05/31 0700 In: 440 [P.O.:440] Out: -     Lab Results: BMET    Component Value Date/Time   NA 132* 03/18/2015 0439   NA 134* 03/16/2015 0528   NA 137 03/13/2015 0853   NA 138 03/06/2015 1049   NA 138 02/27/2015 1015   K 3.8 03/18/2015 0439   K 4.1 03/16/2015 0528   K 4.2 03/13/2015 0853   K 4.0 03/06/2015 1049   K 4.3 02/27/2015 1015   CL 101 03/18/2015 0439   CL 100* 03/16/2015 0528   CO2 25 03/18/2015 0439   CO2 25 03/16/2015 0528   CO2 23 03/13/2015 0853   CO2 23 03/06/2015 1049   CO2 21* 02/27/2015 1015   GLUCOSE 116* 03/18/2015 0439   GLUCOSE 100* 03/16/2015 0528   GLUCOSE 104 03/13/2015 0853   GLUCOSE 106  03/06/2015 1049   GLUCOSE 106 02/27/2015 1015   BUN 13 03/18/2015 0439   BUN 15 03/16/2015 0528   BUN 10.7 03/13/2015 0853   BUN 13.8 03/06/2015 1049   BUN 14.2 02/27/2015 1015   CREATININE 0.77 03/18/2015 0439   CREATININE 0.74 03/16/2015 0528   CREATININE 0.96 03/15/2015 1503   CREATININE 0.8 03/13/2015 0853   CREATININE 0.8 03/06/2015 1049   CREATININE 1.0 02/27/2015 1015   CALCIUM 8.6* 03/18/2015 0439   CALCIUM 8.9 03/16/2015 0528   CALCIUM 9.3 03/13/2015 0853   CALCIUM 9.3 03/06/2015 1049   CALCIUM 9.1 02/27/2015 1015   GFRNONAA >60 03/18/2015 0439   GFRNONAA >60 03/16/2015 0528   GFRNONAA 55* 03/15/2015 1503   GFRAA >60 03/18/2015 0439   GFRAA >60 03/16/2015 0528   GFRAA >60 03/15/2015 1503   CBC    Component Value Date/Time   WBC 5.6 03/18/2015 0439   WBC 4.5 03/13/2015 0852   RBC 3.48* 03/18/2015 0439   RBC 4.38 03/13/2015 0852   HGB 10.1* 03/18/2015 0439   HGB 13.0 03/13/2015 0852   HCT 30.4* 03/18/2015 0439   HCT 38.0 03/13/2015 0852   PLT 138* 03/18/2015 0439   PLT 208 03/13/2015 0852   MCV 87.4 03/18/2015 0439   MCV 86.8 03/13/2015 0852   MCH 29.0 03/18/2015  0439   MCH 29.7 03/13/2015 0852   MCHC 33.2 03/18/2015 0439   MCHC 34.2 03/13/2015 0852   RDW 14.4 03/18/2015 0439   RDW 14.0 03/13/2015 0852   LYMPHSABS 0.9 03/13/2015 0852   MONOABS 0.5 03/13/2015 0852   EOSABS 0.0 03/13/2015 0852   BASOSABS 0.0 03/13/2015 0852   HEPATIC Function Panel  Recent Labs  02/27/15 1015 03/06/15 1049 03/13/15 0853  PROT 6.7 6.6 6.7   HEMOGLOBIN A1C No components found for: HGA1C,  MPG CARDIAC ENZYMES No results found for: CKTOTAL, CKMB, CKMBINDEX, TROPONINI BNP No results for input(s): PROBNP in the last 8760 hours. TSH No results for input(s): TSH in the last 8760 hours. CHOLESTEROL No results for input(s): CHOL in the last 8760 hours.  Scheduled Meds: . amLODipine  2.5 mg Oral q morning - 10a  . [START ON 03/21/2015] ARIPiprazole  7.5 mg Oral Daily   . clonazePAM  0.25 mg Oral BID  . feeding supplement (ENSURE ENLIVE)  237 mL Oral BID BM  . gabapentin  300 mg Oral TID  . meclizine  12.5 mg Oral TID  . multivitamin with minerals  1 tablet Oral Daily  . NEOMYCIN-POLYMYXIN-HYDROCORTISONE  2 drop Both Ears 4 times per day  . pantoprazole  40 mg Oral Daily   Continuous Infusions:  PRN Meds:.acetaminophen **OR** acetaminophen, ondansetron, oxyCODONE  Assessment/Plan: Vasovagal syncope Head injury with bitemporal and left frontal subarachnoid hemorrhage. Severe anxiety S/P Right breast cancer with chemotherapy H/O hypertension Moderate malnutrition  Awaiting SNF if possible. Continue medical care.     LOS: 4 days    Dixie Dials  MD  03/20/2015, 3:56 PM
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
## 2015-03-20 NOTE — Progress Notes (Signed)
During morning rounds patient mentioned FBI and law enforcement, apparently continues to have paranoia. Will continue to monitor closely and discuss possible pshyc issues with MD.
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
## 2015-03-20 NOTE — Clinical Social Work Placement (Signed)
   CLINICAL SOCIAL WORK PLACEMENT  NOTE  Date:  03/20/2015  Patient Details  Name: Grace Paul MRN: [id number] Date of Birth: [birth year]  Clinical Social Work is seeking post-discharge placement for this patient at the [place] level of care (*CSW will initial, date and re-position this form in  chart as items are completed):  Yes   Patient/family provided with [facility] Work Department's list of facilities offering this level of care within the geographic area requested by the patient (or if unable, by the patient's family).  Yes   Patient/family informed of their freedom to choose among providers that offer the needed level of care, that participate in Medicare, Medicaid or managed care program needed by the patient, have an available bed and are willing to accept the patient.  Yes   Patient/family informed of [facility]'s ownership interest in [facility] and [facility], as well as of the fact that they are under no obligation to receive care at these facilities.  PASRR submitted to EDS on 03/20/15     PASRR number received on       Existing PASRR number confirmed on       FL2 transmitted to all facilities in geographic area requested by pt/family on 03/20/15     FL2 transmitted to all facilities within larger geographic area on       Patient informed that his/her managed care company has contracts with or will negotiate with certain facilities, including the following:            Patient/family informed of bed offers received.  Patient chooses bed at       Physician recommends and patient chooses bed at      Patient to be transferred to   on  .  Patient to be transferred to facility by       Patient family notified on   of transfer.  Name of family member notified:        PHYSICIAN Please sign FL2     Additional Comment:    _______________________________________________ Cranford Mon, LCSW 03/20/2015, 1:53 PM

## 2015-03-21 ENCOUNTER — Encounter (HOSPITAL_COMMUNITY): Payer: Self-pay

## 2015-03-21 ENCOUNTER — Ambulatory Visit: Payer: Medicare Other

## 2015-03-21 ENCOUNTER — Inpatient Hospital Stay (HOSPITAL_COMMUNITY): Payer: Medicare Other

## 2015-03-21 ENCOUNTER — Other Ambulatory Visit: Payer: Self-pay | Admitting: Hematology and Oncology

## 2015-03-21 ENCOUNTER — Other Ambulatory Visit: Payer: Medicare Other

## 2015-03-21 DIAGNOSIS — F4310 Post-traumatic stress disorder, unspecified: Secondary | ICD-10-CM

## 2015-03-21 DIAGNOSIS — F418 Other specified anxiety disorders: Secondary | ICD-10-CM

## 2015-03-21 DIAGNOSIS — C50311 Malignant neoplasm of lower-inner quadrant of right female breast: Secondary | ICD-10-CM

## 2015-03-21 DIAGNOSIS — F329 Major depressive disorder, single episode, unspecified: Secondary | ICD-10-CM

## 2015-03-21 DIAGNOSIS — I609 Nontraumatic subarachnoid hemorrhage, unspecified: Secondary | ICD-10-CM

## 2015-03-21 DIAGNOSIS — C50412 Malignant neoplasm of upper-outer quadrant of left female breast: Secondary | ICD-10-CM

## 2015-03-21 LAB — BASIC METABOLIC PANEL
ANION GAP: 9 (ref 5–15)
BUN: 17 mg/dL (ref 6–20)
CO2: 29 mmol/L (ref 22–32)
Calcium: 8.8 mg/dL — ABNORMAL LOW (ref 8.9–10.3)
Chloride: 96 mmol/L — ABNORMAL LOW (ref 101–111)
Creatinine, Ser: 0.77 mg/dL (ref 0.44–1.00)
GFR calc Af Amer: >60 mL/min (ref >60)
GFR calc non Af Amer: >60 mL/min (ref >60)
Glucose, Bld: 100 mg/dL — ABNORMAL HIGH (ref 65–99)
Potassium: 4 mmol/L (ref 3.5–5.1)
Sodium: 134 mmol/L — ABNORMAL LOW (ref 135–145)

## 2015-03-21 LAB — CBC
HCT: 27.5 % — ABNORMAL LOW (ref 36.0–46.0)
HEMOGLOBIN: 9.3 g/dL — AB (ref 12.0–15.0)
MCH: 29.5 pg (ref 26.0–34.0)
MCHC: 33.8 g/dL (ref 30.0–36.0)
MCV: 87.3 fL (ref 78.0–100.0)
PLATELETS: 152 10*3/uL (ref 150–400)
RBC: 3.15 MIL/uL — AB (ref 3.87–5.11)
RDW: 14.4 % (ref 11.5–15.5)
WBC: 4.8 10*3/uL (ref 4.0–10.5)

## 2015-03-21 LAB — FERRITIN: FERRITIN: 118 ng/mL (ref 11–307)

## 2015-03-21 LAB — IRON AND TIBC
Iron: 38 ug/dL (ref 28–170)
Saturation Ratios: 15 % (ref 10.4–31.8)
TIBC: 252 ug/dL (ref 250–450)
UIBC: 214 ug/dL

## 2015-03-21 LAB — VITAMIN B12: VITAMIN B 12: 711 pg/mL (ref 180–914)

## 2015-03-21 NOTE — Progress Notes (Signed)
Ref: Birdie Riddle, MD   Subjective:  Feels relieved with discontinuation of chemotherapy. T max 100.5 F. Repeat CT head shows decreasing SAH.  Objective:  Vital Signs in the last 24 hours: Temp:  [98.7 F (37.1 C)-100.5 F (38.1 C)] 99.7 F (37.6 C) (06/01 1349) Pulse Rate:  [75-93] 87 (06/01 1349) Cardiac Rhythm:  [-] Normal sinus rhythm (06/01 0800) Resp:  [18] 18 (06/01 1349) BP: (111-135)/(43-58) 116/58 mmHg (06/01 1349) SpO2:  [96 %] 96 % (06/01 1349) Weight:  [51 kg (112 lb 7 oz)] 51 kg (112 lb 7 oz) (06/01 0447)  Physical Exam: BP Readings from Last 1 Encounters:  03/21/15 116/58    Wt Readings from Last 1 Encounters:  03/21/15 51 kg (112 lb 7 oz)    Weight change: 0.5 kg (1 lb 1.6 oz)  HEENT: [place]/AT, Eyes-Blue, PERL, EOMI, Conjunctiva-Pink, Sclera-Non-icteric Neck: No JVD, No bruit, Trachea midline. Lungs:  Clear, Bilateral. Cardiac:  Regular rhythm, normal S1 and S2, no S3.  Abdomen:  Soft, non-tender. Extremities:  No edema present. No cyanosis. No clubbing. CNS: AxOx3, Cranial nerves grossly intact, moves all 4 extremities. Right handed. Skin: Warm and dry.   Intake/Output from previous day: 05/31 0701 - 06/01 0700 In: 360 [P.O.:360] Out: 6 [Urine:4; Stool:2]    Lab Results: BMET    Component Value Date/Time   NA 134* 03/21/2015 0442   NA 132* 03/18/2015 0439   NA 134* 03/16/2015 0528   NA 137 03/13/2015 0853   NA 138 03/06/2015 1049   NA 138 02/27/2015 1015   K 4.0 03/21/2015 0442   K 3.8 03/18/2015 0439   K 4.1 03/16/2015 0528   K 4.2 03/13/2015 0853   K 4.0 03/06/2015 1049   K 4.3 02/27/2015 1015   CL 96* 03/21/2015 0442   CL 101 03/18/2015 0439   CL 100* 03/16/2015 0528   CO2 29 03/21/2015 0442   CO2 25 03/18/2015 0439   CO2 25 03/16/2015 0528   CO2 23 03/13/2015 0853   CO2 23 03/06/2015 1049   CO2 21* 02/27/2015 1015   GLUCOSE 100* 03/21/2015 0442   GLUCOSE 116* 03/18/2015 0439   GLUCOSE 100* 03/16/2015 0528   GLUCOSE 104  03/13/2015 0853   GLUCOSE 106 03/06/2015 1049   GLUCOSE 106 02/27/2015 1015   BUN 17 03/21/2015 0442   BUN 13 03/18/2015 0439   BUN 15 03/16/2015 0528   BUN 10.7 03/13/2015 0853   BUN 13.8 03/06/2015 1049   BUN 14.2 02/27/2015 1015   CREATININE 0.77 03/21/2015 0442   CREATININE 0.77 03/18/2015 0439   CREATININE 0.74 03/16/2015 0528   CREATININE 0.8 03/13/2015 0853   CREATININE 0.8 03/06/2015 1049   CREATININE 1.0 02/27/2015 1015   CALCIUM 8.8* 03/21/2015 0442   CALCIUM 8.6* 03/18/2015 0439   CALCIUM 8.9 03/16/2015 0528   CALCIUM 9.3 03/13/2015 0853   CALCIUM 9.3 03/06/2015 1049   CALCIUM 9.1 02/27/2015 1015   GFRNONAA >60 03/21/2015 0442   GFRNONAA >60 03/18/2015 0439   GFRNONAA >60 03/16/2015 0528   GFRAA >60 03/21/2015 0442   GFRAA >60 03/18/2015 0439   GFRAA >60 03/16/2015 0528   CBC    Component Value Date/Time   WBC 4.8 03/21/2015 0442   WBC 4.5 03/13/2015 0852   RBC 3.15* 03/21/2015 0442   RBC 4.38 03/13/2015 0852   HGB 9.3* 03/21/2015 0442   HGB 13.0 03/13/2015 0852   HCT 27.5* 03/21/2015 0442   HCT 38.0 03/13/2015 0852   PLT 152 03/21/2015 0442  PLT 208 03/13/2015 0852   MCV 87.3 03/21/2015 0442   MCV 86.8 03/13/2015 0852   MCH 29.5 03/21/2015 0442   MCH 29.7 03/13/2015 0852   MCHC 33.8 03/21/2015 0442   MCHC 34.2 03/13/2015 0852   RDW 14.4 03/21/2015 0442   RDW 14.0 03/13/2015 0852   LYMPHSABS 0.9 03/13/2015 0852   MONOABS 0.5 03/13/2015 0852   EOSABS 0.0 03/13/2015 0852   BASOSABS 0.0 03/13/2015 0852   HEPATIC Function Panel  Recent Labs  02/27/15 1015 03/06/15 1049 03/13/15 0853  PROT 6.7 6.6 6.7   HEMOGLOBIN A1C No components found for: HGA1C,  MPG CARDIAC ENZYMES No results found for: CKTOTAL, CKMB, CKMBINDEX, TROPONINI BNP No results for input(s): PROBNP in the last 8760 hours. TSH No results for input(s): TSH in the last 8760 hours. CHOLESTEROL No results for input(s): CHOL in the last 8760 hours.  Scheduled Meds: . amLODipine   2.5 mg Oral q morning - 10a  . ARIPiprazole  7.5 mg Oral Daily  . clonazePAM  0.25 mg Oral BID  . feeding supplement (ENSURE ENLIVE)  237 mL Oral BID BM  . gabapentin  300 mg Oral TID  . meclizine  12.5 mg Oral TID  . multivitamin with minerals  1 tablet Oral Daily  . NEOMYCIN-POLYMYXIN-HYDROCORTISONE  2 drop Both Ears 4 times per day  . pantoprazole  40 mg Oral Daily   Continuous Infusions:  PRN Meds:.acetaminophen **OR** acetaminophen, oxyCODONE  Assessment/Plan: Vasovagal syncope Head injury with bitemporal and left frontal subarachnoid hemorrhage. Severe anxiety S/P Right breast cancer with chemotherapy H/O hypertension Moderate malnutrition  Continue medical treatment. Increase activity. If not a candidate for SNF for short term, willing to go back home and stay in touch with neighbours and friends and follow with oncology for repeat MRI and surgical management.     LOS: 5 days    Dixie Dials  MD  03/21/2015, 6:22 PM

## 2015-03-21 NOTE — Care Management Note (Signed)
Case Management Note  Patient Details  Name: Grace Paul MRN: [id number] Date of Birth: 01-23-1935  Subjective/Objective:         Head injury with bitemporal and left frontal subarachnoid hemorrhage           Action/Plan: Lives at home alone, recommendation for SNF  Expected Discharge Date:      03/22/2015            Expected Discharge Plan:  Skilled Nursing Facility  In-House Referral:   Clinical Social Worker  Discharge planning Services  CM Consult   Status of Service:  Completed, signed off  Medicare Important Message Given:  Yes Date Medicare IM Given:  03/21/15 Medicare IM give by:  Jonnie Finner RN CCM  Date Additional Medicare IM Given:    Additional Medicare Important Message give by:     If discussed at [place] of Stay Meetings, dates discussed:    Additional Comments: NCM spoke to pt and states she does not have any thoughts of harming herself at this time. States she needs more assistance now that she is older and is willing to go to SNF. She states her friends and neighbors are helping care for her dog at home.  Erenest Rasher, RN 03/21/2015, 3:45 PM
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
## 2015-03-21 NOTE — Progress Notes (Signed)
Chaplain Follow-up  Grace Paul is in a good place, looking forward to getting better and working through her issues with forgiveness.  Explored faith and belief issues:  Grace Paul was visited today by three ladies from the [employer] and also took communion with them.  Grace Paul is pleased that her doctor will not be continuing chemo treatments for her.  She still would like to have a chaplain and a psychiatrist to talk her life story.  Will follow as needed.   03/21/15 1800  Clinical Encounter Type  Visited With Patient;Health care provider  Visit Type Follow-up;Spiritual support  Referral From Nurse  Consult/Referral To Chaplain  Spiritual Encounters  Spiritual Needs Emotional

## 2015-03-21 NOTE — Progress Notes (Signed)
Talked with Dr. Rip Harbour and it is okay to d/c safety sitter. Grace Paul

## 2015-03-22 LAB — FOLATE RBC
FOLATE, HEMOLYSATE: 416.5 ng/mL
Folate, RBC: 1441 ng/mL (ref >498)
Hematocrit: 28.9 % — ABNORMAL LOW (ref 34.0–46.6)

## 2015-03-22 MED ORDER — FERROUS SULFATE 325 (65 FE) MG PO TABS
325.0000 mg | ORAL_TABLET | Freq: Every day | ORAL | Status: DC
  Administered 2015-03-22: 325 mg via ORAL
  Filled 2015-03-22 (×2): qty 1

## 2015-03-22 MED ORDER — OXYCODONE HCL 5 MG PO TABS: 5.0000 mg | ORAL_TABLET | Freq: Two times a day (BID) | ORAL | Status: DC | PRN

## 2015-03-22 MED ORDER — CLONAZEPAM 0.5 MG PO TABS: 0.2500 mg | ORAL_TABLET | Freq: Two times a day (BID) | ORAL | Status: DC

## 2015-03-22 MED ORDER — ENSURE ENLIVE PO LIQD: 237.0000 mL | Freq: Two times a day (BID) | ORAL | Status: DC

## 2015-03-22 MED ORDER — ARIPIPRAZOLE 15 MG PO TABS: 7.5000 mg | ORAL_TABLET | Freq: Every day | ORAL | Status: DC

## 2015-03-22 NOTE — Progress Notes (Signed)
Ref: Birdie Riddle, MD   Subjective:  Feeling better. Some back of head and neck pain.  Objective:  Vital Signs in the last 24 hours: Temp:  [97.8 F (36.6 C)-99.7 F (37.6 C)] 98.7 F (37.1 C) (06/02 0414) Pulse Rate:  [78-87] 78 (06/02 0414) Cardiac Rhythm:  [-] Normal sinus rhythm (06/01 2010) Resp:  [18] 18 (06/02 0414) BP: (116-135)/(48-58) 121/48 mmHg (06/02 0414) SpO2:  [96 %-100 %] 97 % (06/02 0414) Weight:  [51.03 kg (112 lb 8 oz)] 51.03 kg (112 lb 8 oz) (06/02 0244)  Physical Exam: BP Readings from Last 1 Encounters:  03/22/15 121/48    Wt Readings from Last 1 Encounters:  03/22/15 51.03 kg (112 lb 8 oz)    Weight change: 0.03 kg (1.1 oz)  HEENT: [place]/AT, Eyes-Blue, PERL, EOMI, Conjunctiva-Pink, Sclera-Non-icteric Neck: No JVD, No bruit, Trachea midline. Lungs:  Clear, Bilateral. Cardiac:  Regular rhythm, normal S1 and S2, no S3.  Abdomen:  Soft, non-tender. Extremities:  No edema present. No cyanosis. No clubbing. CNS: AxOx3, Cranial nerves grossly intact, moves all 4 extremities. Right handed. Skin: Warm and dry.   Intake/Output from previous day: 06/01 0701 - 06/02 0700 In: 720 [P.O.:720] Out: 850 [Urine:850]    Lab Results: BMET    Component Value Date/Time   NA 134* 03/21/2015 0442   NA 132* 03/18/2015 0439   NA 134* 03/16/2015 0528   NA 137 03/13/2015 0853   NA 138 03/06/2015 1049   NA 138 02/27/2015 1015   K 4.0 03/21/2015 0442   K 3.8 03/18/2015 0439   K 4.1 03/16/2015 0528   K 4.2 03/13/2015 0853   K 4.0 03/06/2015 1049   K 4.3 02/27/2015 1015   CL 96* 03/21/2015 0442   CL 101 03/18/2015 0439   CL 100* 03/16/2015 0528   CO2 29 03/21/2015 0442   CO2 25 03/18/2015 0439   CO2 25 03/16/2015 0528   CO2 23 03/13/2015 0853   CO2 23 03/06/2015 1049   CO2 21* 02/27/2015 1015   GLUCOSE 100* 03/21/2015 0442   GLUCOSE 116* 03/18/2015 0439   GLUCOSE 100* 03/16/2015 0528   GLUCOSE 104 03/13/2015 0853   GLUCOSE 106 03/06/2015 1049   GLUCOSE  106 02/27/2015 1015   BUN 17 03/21/2015 0442   BUN 13 03/18/2015 0439   BUN 15 03/16/2015 0528   BUN 10.7 03/13/2015 0853   BUN 13.8 03/06/2015 1049   BUN 14.2 02/27/2015 1015   CREATININE 0.77 03/21/2015 0442   CREATININE 0.77 03/18/2015 0439   CREATININE 0.74 03/16/2015 0528   CREATININE 0.8 03/13/2015 0853   CREATININE 0.8 03/06/2015 1049   CREATININE 1.0 02/27/2015 1015   CALCIUM 8.8* 03/21/2015 0442   CALCIUM 8.6* 03/18/2015 0439   CALCIUM 8.9 03/16/2015 0528   CALCIUM 9.3 03/13/2015 0853   CALCIUM 9.3 03/06/2015 1049   CALCIUM 9.1 02/27/2015 1015   GFRNONAA >60 03/21/2015 0442   GFRNONAA >60 03/18/2015 0439   GFRNONAA >60 03/16/2015 0528   GFRAA >60 03/21/2015 0442   GFRAA >60 03/18/2015 0439   GFRAA >60 03/16/2015 0528   CBC    Component Value Date/Time   WBC 4.8 03/21/2015 0442   WBC 4.5 03/13/2015 0852   RBC 3.15* 03/21/2015 0442   RBC 4.38 03/13/2015 0852   HGB 9.3* 03/21/2015 0442   HGB 13.0 03/13/2015 0852   HCT 27.5* 03/21/2015 0442   HCT 38.0 03/13/2015 0852   PLT 152 03/21/2015 0442   PLT 208 03/13/2015 0852   MCV  87.3 03/21/2015 0442   MCV 86.8 03/13/2015 0852   MCH 29.5 03/21/2015 0442   MCH 29.7 03/13/2015 0852   MCHC 33.8 03/21/2015 0442   MCHC 34.2 03/13/2015 0852   RDW 14.4 03/21/2015 0442   RDW 14.0 03/13/2015 0852   LYMPHSABS 0.9 03/13/2015 0852   MONOABS 0.5 03/13/2015 0852   EOSABS 0.0 03/13/2015 0852   BASOSABS 0.0 03/13/2015 0852   HEPATIC Function Panel  Recent Labs  02/27/15 1015 03/06/15 1049 03/13/15 0853  PROT 6.7 6.6 6.7   HEMOGLOBIN A1C No components found for: HGA1C,  MPG CARDIAC ENZYMES No results found for: CKTOTAL, CKMB, CKMBINDEX, TROPONINI BNP No results for input(s): PROBNP in the last 8760 hours. TSH No results for input(s): TSH in the last 8760 hours. CHOLESTEROL No results for input(s): CHOL in the last 8760 hours.  Scheduled Meds: . ARIPiprazole  7.5 mg Oral Daily  . clonazePAM  0.25 mg Oral BID  .  feeding supplement (ENSURE ENLIVE)  237 mL Oral BID BM  . ferrous sulfate  325 mg Oral Q breakfast  . gabapentin  300 mg Oral TID  . meclizine  12.5 mg Oral TID  . multivitamin with minerals  1 tablet Oral Daily  . NEOMYCIN-POLYMYXIN-HYDROCORTISONE  2 drop Both Ears 4 times per day  . pantoprazole  40 mg Oral Daily   Continuous Infusions:  PRN Meds:.acetaminophen **OR** acetaminophen, oxyCODONE  Assessment/Plan: Vasovagal syncope Head injury with bitemporal and left frontal subarachnoid hemorrhage. Severe anxiety S/P Right breast cancer with chemotherapy H/O hypertension Moderate malnutrition  Increase activity.     LOS: 6 days    Dixie Dials  MD  03/22/2015, 8:42 AM

## 2015-03-22 NOTE — Progress Notes (Signed)
Report called off Leilani Able at [facility]. Pt discharge to SNF with [facility]AR to transport pt to disposition. Pt IV and telemetry removed. Pt cleaned and awaiting comfortably in room. Will closely monitor till pick up. Pt discharge education completed. Francis Gaines Cadance Raus RN.

## 2015-03-22 NOTE — Progress Notes (Signed)
Nutrition Follow-up  DOCUMENTATION CODES:  Non-severe (moderate) malnutrition in context of chronic illness  INTERVENTION:  Ensure Enlive (each supplement provides 350kcal and 20 grams of protein)  NUTRITION DIAGNOSIS:  Malnutrition related to chronic illness as evidenced by moderate depletions of muscle mass, moderate depletion of body fat, ongoing  GOAL:  Patient will meet greater than or equal to 90% of their needs, progressing  MONITOR:  PO intake, Labs, Weight trends, I & O's  ASSESSMENT: Pt is a 80 y.o. female with history of HTN, anxiety, breast cancer admitted on 5/26 with anxiety, palpitations and nervousness. Morning of 5/27 she had an episode of LOC while having a BM. She fell to the floor, there was noted blood on the posterior surface of her had and the floor.  Patient reports she's eating OK.  PO intake 50-75% per flowsheet records.  Malnutrition ongoing.  Enjoys drinking her Ensure Enlive supplements.  Height:  Ht Readings from Last 1 Encounters:  03/15/15 5' (1.524 m)    Weight:  Wt Readings from Last 1 Encounters:  03/22/15 112 lb 8 oz (51.03 kg)    Ideal Body Weight:  45.5 kg  Wt Readings from Last 10 Encounters:  03/22/15 112 lb 8 oz (51.03 kg)  03/13/15 110 lb 6 oz (50.066 kg)  02/27/15 111 lb 4.8 oz (50.485 kg)  02/20/15 113 lb (51.256 kg)  02/15/15 112 lb 12.8 oz (51.166 kg)  02/07/15 115 lb (52.164 kg)  02/07/15 113 lb 3.2 oz (51.347 kg)  01/24/15 116 lb 1.6 oz (52.663 kg)  10/10/12 119 lb (53.978 kg)    BMI:  Body mass index is 21.97 kg/(m^2).  Estimated Nutritional Needs:  Kcal:  1100 - 1300  Protein:  65 - 80 g  Fluid:  > 1.3 L  Skin:  Reviewed, no issues  Diet Order:  Diet Heart Room service appropriate?: Yes; Fluid consistency:: Thin  EDUCATION NEEDS:  No education needs identified at this time   Intake/Output Summary (Last 24 hours) at 03/22/15 1107 Last data filed at 03/22/15 0415  Gross per 24 hour  Intake     720 ml  Output    850 ml  Net   -130 ml    Last BM:  6/1  Arthur Holms, RD, LDN Pager #: [phone] After-Hours Pager #: [phone]

## 2015-03-22 NOTE — Discharge Summary (Signed)
Physician Discharge Summary  Patient ID: Grace Paul MRN: [id number] DOB/AGE: 78-Jul-1936 78 y.o.  Admit date: 03/15/2015 Discharge date: 03/22/2015  Admission Diagnoses: Severe anxiety S/P Right breast cancer with chemotherapy H/O hypertension  Discharge Diagnoses:  Principal Problem: * GAD (generalized anxiety disorder) * Active Problems:   Palpitation   Essential hypertension   Vasovagal syncope   Head injury with bitemporal and left frontal subarachnoid hemorrhage.   S/P Bilateral breast cancer with chemotherapy   Moderate malnutrition  Discharged Condition: fair  Hospital Course: 80 year old white female with history of right-sided breast cancer currently on neoadjuvant chemotherapy with weekly Taxol and carboplatin. She has been tolerating chemotherapy extremely well with very limited side effects from the treatment itself. She has a lot of emotional problems and psychiatric illness with severe anxiety and depression. On admission she had palpitations and felt extremely anxious.  She had Psych consult of Dr.  Ambrose Finland and Oncology consult of Dr. Nicholas Lose. She improved with Abilify use and had mental relief from not undergoing additional chemotherapy post discussion with Dr. Lindi Adie, who will change therapy in near future for bilateral breast cancer without metastasis. She had sustained a fall with small bitemporal and left frontal subarachnoid hemorrhage. This was treated with observation and serial CT head scan showing regression of SAH. She had PT/OT consult and was sent to SNF for improving gait. She will be followed by me, Psychiatrist and oncologist in 1-2 weeks or as needed.   Consults: hematology/oncology and psychiatry  Significant Diagnostic Studies: labs: Normal CBC and CMET on admission. Normal Iron, B-12 and folate levels. Low Hgb of 9.3 on 03/21/2015.  CT head on 03/16/2015: There are foci of subarachnoid hemorrhage in each temporal lobe. There is  hemorrhage in the periphery of the inferior left frontal lobe consistent with localized hemorrhagic contusion. There may be an the minimal subdural hematoma in this area. There is no mass effect or midline shift. No edema seen. No acute infarct apparent. There is mild underlying atrophy.  CT head on 03/21/2015: Mild interval decrease of acute subarachnoid hemorrhage.  Treatments: IV hydration. Aripiprazole, Clonazepam, Ensure, Oxycodone  Discharge Exam: Blood pressure 144/46, pulse 84, temperature 98.7 F (37.1 C), temperature source Oral, resp. rate 16, height 5' (1.524 m), weight 51.03 kg (112 lb 8 oz), SpO2 99 %. HEENT: [place]/AT, Eyes-Blue, PERL, EOMI, Conjunctiva-Pink, Sclera-Non-icteric Neck: No JVD, No bruit, Trachea midline. Lungs: Clear, Bilateral. Cardiac: Regular rhythm, normal S1 and S2, no S3.  Abdomen: Soft, non-tender. Extremities: No edema present. No cyanosis. No clubbing. CNS: AxOx3, Cranial nerves grossly intact, moves all 4 extremities. Right handed. Skin: Warm and dry.  Disposition: 03-Skilled Nursing Care     Medication List    STOP taking these medications        amLODipine 2.5 MG tablet  Commonly known as:  NORVASC     HYDROcodone-acetaminophen 5-325 MG per tablet  Commonly known as:  NORCO     LORazepam 1 MG tablet  Commonly known as:  ATIVAN     metoprolol succinate 25 MG 24 hr tablet  Commonly known as:  TOPROL-XL     temazepam 15 MG capsule  Commonly known as:  RESTORIL     Vitamin B 12 100 MCG Lozg      TAKE these medications        acetaminophen 325 MG tablet  Commonly known as:  TYLENOL  Take 650 mg by mouth every 6 (six) hours as needed for mild pain or moderate pain.  ARIPiprazole 15 MG tablet  Commonly known as:  ABILIFY  Take 0.5 tablets (7.5 mg total) by mouth daily.     clonazePAM 0.5 MG tablet  Commonly known as:  KLONOPIN  Take 0.5 tablets (0.25 mg total) by mouth 2 (two) times daily.     esomeprazole 40 MG capsule   Commonly known as:  NEXIUM  Take 40 mg by mouth daily as needed. For indigestion.     feeding supplement (ENSURE ENLIVE) Liqd  Take 237 mLs by mouth 2 (two) times daily between meals.     lidocaine-prilocaine cream  Commonly known as:  EMLA  Apply 1 application topically as needed.     meclizine 12.5 MG tablet  Commonly known as:  ANTIVERT  Take 12.5 mg by mouth 2 (two) times daily as needed for dizziness.     neomycin-polymyxin-hydrocortisone 3.5-10000-1 otic suspension  Commonly known as:  CORTISPORIN  Place 3 drops into both ears daily as needed (when ear bothers her).     oxyCODONE 5 MG immediate release tablet  Commonly known as:  Oxy IR/ROXICODONE  Take 1 tablet (5 mg total) by mouth 2 (two) times daily as needed for moderate pain.     SYSTANE OP  Place 1 drop into both eyes at bedtime.           Follow-up Information    Follow up with [facility], MD. Schedule an appointment as soon as possible for a visit in 2 weeks.   Specialty:  Cardiology   Contact information:   [place] [address] [phone]       Follow up with Rulon Eisenmenger, MD. Schedule an appointment as soon as possible for a visit in 2 weeks.   Specialty:  Hematology and Oncology   Contact information:   [place] [address] [phone]       Follow up with Durward Parcel., MD. Schedule an appointment as soon as possible for a visit in 2 weeks.   Specialty:  Psychiatry   Contact information:   [address] [place] [address] [phone]       Signed: Birdie Riddle 03/22/2015, 3:31 PM

## 2015-03-22 NOTE — Progress Notes (Signed)
Physical Therapy Treatment Patient Details Name: Grace Paul MRN: [id number] DOB: 1934/11/18 Today's Date: 03/22/2015    History of Present Illness Pt adm for increased symptoms of depression , anxiety , passive suicidal thoughts , restlessness, insomnia. While in hospital pt passed out and fell from commode and sufferred small bi-temporal SAH and left frontal ICH. PMH - breast CA (recent chemo), anxiety, post traumatic stress,     PT Comments    Pt very pleasant with better insight into her balance deficits, however continues to get up by herself and at times requires min assist to maintain her balance. Discussed her leg length discrepancy and fact she uses a lift in her Lt shoe. She will have family bring her shoes. Worked on standing balance and remains unsteady. Anticipate this will improve some with her shoes.   Follow Up Recommendations  SNF     Equipment Recommendations  None recommended by PT    Recommendations for Other Services OT consult     Precautions / Restrictions Precautions Precautions: Fall Precaution Comments: on suicide precautions    Mobility  Bed Mobility Overal bed mobility: Independent                Transfers Overall transfer level: Needs assistance Equipment used: None Transfers: Sit to/from Stand Sit to Stand: Min guard;Supervision         General transfer comment: repeated x 4; initially reported light-headedness and unsteady; orthostatic BPs normal; improved balance with repetition  Ambulation/Gait Ambulation/Gait assistance: Min assist Ambulation Distance (Feet): 400 Feet Assistive device: None Gait Pattern/deviations: Step-through pattern;Staggering left;Staggering right;Drifts right/left   Gait velocity interpretation: Below normal speed for age/gender General Gait Details: Pt drifting Lt/Rt and with occasional stagger requiring min assist to recover. Noted pt with leg length discrepancy (vaults) and she reports she normally  wears shoe lift in Lt shoe. No shoes in her room and wearing hospital slipper socks.   Stairs            Wheelchair Mobility    Modified Rankin (Stroke Patients Only)       Balance     Sitting balance-Leahy Scale: Normal Sitting balance - Comments: donning socks EOB    Standing balance support: No upper extremity supported Standing balance-Leahy Scale: Fair           Rhomberg - Eyes Opened: 72 Rhomberg - Eyes Closed: 15 (incr anterior-posterior sway with pt able to self-correct)        Cognition Arousal/Alertness: Awake/alert Behavior During Therapy: Impulsive Overall Cognitive Status: Impaired/Different from baseline Area of Impairment: Attention;Safety/judgement   Current Attention Level: Selective     Safety/Judgement: Decreased awareness of safety;Decreased awareness of deficits     General Comments: Pt admits to getting up to go to bathroom by herself, despite bed alarm and nursing telling her not to    Exercises      General Comments        Pertinent Vitals/Pain      Home Living                      Prior Function            PT Goals (current goals can now be found in the care plan section) Acute Rehab PT Goals Patient Stated Goal: get anxiety better PT Goal Formulation: With patient Time For Goal Achievement: 03/29/15 Potential to Achieve Goals: Good Progress towards PT goals: Progressing toward goals    Frequency  Min 3X/week  PT Plan Current plan remains appropriate    Co-evaluation             End of Session Equipment Utilized During Treatment: Gait belt Activity Tolerance: Patient tolerated treatment well Patient left: in bed;with bed alarm set;with call bell/phone within reach     Time: 7510-2585 PT Time Calculation (min) (ACUTE ONLY): 26 min  Charges:  $Gait Training: 23-37 mins                    G Codes:      Grace Paul 03/24/15, 4:26 PM Pager [phone]

## 2015-03-22 NOTE — Progress Notes (Signed)
Pt picked up by [facility] to be transported off to disposition. Pt transported off unit via stretcher with her belongings including her locked purses and medications to transporters to give to facility. Francis Gaines Iverna Hammac RN.

## 2015-03-23 LAB — HEMOGLOBIN A1C
Hgb A1c MFr Bld: 6.1 % — ABNORMAL HIGH (ref 4.8–5.6)
Mean Plasma Glucose: 128 mg/dL

## 2015-03-26 ENCOUNTER — Other Ambulatory Visit: Payer: Self-pay | Admitting: *Deleted

## 2015-03-26 DIAGNOSIS — C50311 Malignant neoplasm of lower-inner quadrant of right female breast: Secondary | ICD-10-CM

## 2015-03-27 ENCOUNTER — Ambulatory Visit: Payer: Medicare Other | Admitting: Nurse Practitioner

## 2015-03-27 ENCOUNTER — Ambulatory Visit: Payer: Medicare Other

## 2015-03-27 ENCOUNTER — Encounter: Payer: Self-pay | Admitting: *Deleted

## 2015-03-27 ENCOUNTER — Telehealth: Payer: Self-pay | Admitting: *Deleted

## 2015-03-27 ENCOUNTER — Other Ambulatory Visit: Payer: Self-pay | Admitting: *Deleted

## 2015-03-27 ENCOUNTER — Other Ambulatory Visit: Payer: Medicare Other

## 2015-03-27 NOTE — Progress Notes (Unsigned)
Patient is now residing at [facility].  Contact Tammy for any transportation coordination needs at [phone].

## 2015-03-27 NOTE — Telephone Encounter (Signed)
Cancelled chemo appt per Varney Biles. Patient going to surgery

## 2015-03-28 ENCOUNTER — Ambulatory Visit
Admission: RE | Admit: 2015-03-28 | Discharge: 2015-03-28 | Disposition: A | Discharge status: Home / Self Care | Payer: Medicare Other | Source: Ambulatory Visit | Attending: Hematology and Oncology | Admitting: Hematology and Oncology

## 2015-03-28 DIAGNOSIS — C50311 Malignant neoplasm of lower-inner quadrant of right female breast: Secondary | ICD-10-CM

## 2015-03-28 MED ORDER — GADOBENATE DIMEGLUMINE 529 MG/ML IV SOLN
10.0000 mL | Freq: Once | INTRAVENOUS | Status: AC | PRN
  Administered 2015-03-28: 10 mL via INTRAVENOUS

## 2015-04-03 ENCOUNTER — Ambulatory Visit: Payer: Medicare Other

## 2015-04-03 ENCOUNTER — Other Ambulatory Visit: Payer: Medicare Other

## 2015-04-05 ENCOUNTER — Encounter: Payer: Self-pay | Admitting: *Deleted

## 2015-04-05 NOTE — Progress Notes (Signed)
Oncology Nurse Navigator Documentation  Oncology Nurse Navigator Flowsheets 04/05/2015  Navigator Encounter Type Treatment  Patient Visit Type Medonc  Treatment Phase Other  Barriers/Navigation Needs Transportation  Time Spent with Patient 14   Visited with patient on 6/14 at [facility].  She has had some anxiety about her upcoming appointments with Dr. Lindi Adie and Dr. Dalbert Batman.  Informed her that it's really not necessary to see Dr. Lindi Adie on 6/21 and that we could cancel that appointment for now and he can see her after surgery.  She was in agreement with this.  She continues to have anxiety about surgery and she is wanting to go home.  Tried to give her reassurance and encouragement.  She was very thankful for the visit and I informed her that we would see her after surgery but encouraged her to call with any needs or concerns.

## 2015-04-10 ENCOUNTER — Ambulatory Visit: Payer: Medicare Other

## 2015-04-10 ENCOUNTER — Other Ambulatory Visit: Payer: Medicare Other

## 2015-04-10 ENCOUNTER — Other Ambulatory Visit: Payer: Self-pay | Admitting: General Surgery

## 2015-04-10 ENCOUNTER — Ambulatory Visit: Payer: Medicare Other | Admitting: Hematology and Oncology

## 2015-04-10 DIAGNOSIS — C50912 Malignant neoplasm of unspecified site of left female breast: Principal | ICD-10-CM

## 2015-04-10 DIAGNOSIS — C50911 Malignant neoplasm of unspecified site of right female breast: Secondary | ICD-10-CM

## 2015-04-15 ENCOUNTER — Other Ambulatory Visit: Payer: Self-pay | Admitting: General Surgery

## 2015-04-15 DIAGNOSIS — C50911 Malignant neoplasm of unspecified site of right female breast: Secondary | ICD-10-CM

## 2015-04-15 DIAGNOSIS — C50912 Malignant neoplasm of unspecified site of left female breast: Principal | ICD-10-CM

## 2015-04-17 ENCOUNTER — Other Ambulatory Visit: Payer: Medicare Other

## 2015-04-17 ENCOUNTER — Ambulatory Visit: Payer: Medicare Other

## 2015-04-24 ENCOUNTER — Encounter: Payer: Self-pay | Admitting: *Deleted

## 2015-04-24 ENCOUNTER — Other Ambulatory Visit: Payer: Self-pay | Admitting: *Deleted

## 2015-04-24 ENCOUNTER — Ambulatory Visit: Payer: Medicare Other

## 2015-04-24 ENCOUNTER — Other Ambulatory Visit: Payer: Medicare Other

## 2015-04-24 ENCOUNTER — Other Ambulatory Visit: Payer: Self-pay | Admitting: General Surgery

## 2015-04-24 ENCOUNTER — Telehealth: Payer: Self-pay | Admitting: Hematology and Oncology

## 2015-04-24 ENCOUNTER — Encounter: Payer: Self-pay | Admitting: Hematology and Oncology

## 2015-04-24 ENCOUNTER — Ambulatory Visit (HOSPITAL_BASED_OUTPATIENT_CLINIC_OR_DEPARTMENT_OTHER): Payer: Medicare Other | Admitting: Hematology and Oncology

## 2015-04-24 VITALS — BP 160/90 | HR 90 | Temp 98.2°F | Resp 18 | Ht 60.0 in | Wt 113.6 lb

## 2015-04-24 DIAGNOSIS — C50311 Malignant neoplasm of lower-inner quadrant of right female breast: Secondary | ICD-10-CM

## 2015-04-24 DIAGNOSIS — C50412 Malignant neoplasm of upper-outer quadrant of left female breast: Secondary | ICD-10-CM | POA: Diagnosis not present

## 2015-04-24 DIAGNOSIS — F341 Dysthymic disorder: Secondary | ICD-10-CM | POA: Diagnosis not present

## 2015-04-24 NOTE — Progress Notes (Signed)
[place] Work  Clinical Social Work was referred by patient navigator for assessment of psychosocial needs.  Clinical Social Worker met with patient and her friend, [name] at [facility] to offer support and assess for needs.  Pt and friend had quite a few questions regarding coordination of care concerns. Pt recently d/c'd from [facility] and was sent home without [facility] or DME set up. Pt requesting walker as she is unsteady with cane also no follow up for [facility] PT to continue. CSW let RN know orders might be needed for this if MD agrees. Pt also has MCD and could possibly qualify for CNA after surgery through [facility], but this would need to be set up in advance.   Pt and friend asking about follow up appointments from hospital stay. Per d/c summary, pt was to call and set up follow up with psych and cardiology for follow up in 1-2 weeks. This was noted in d/c summary dated 03/22/15. Not sure that this follow up has been arranged. RN navigator and desk RN notified of possible need of continued coordination of care.   Clinical Social Work interventions: Pt advocacy Care coordination  Loren Racer, [place] Social Worker [place]  [place] Phone: [phone] Fax: [phone]

## 2015-04-24 NOTE — Progress Notes (Signed)
Patient Care Team: Dixie Dials, MD as PCP - General (Cardiology) [name], MD as Consulting Physician (General Surgery) Nicholas Lose, MD as Consulting Physician (Hematology and Oncology) Arloa Koh, MD as Consulting Physician (Radiation Oncology) Rockwell Germany, RN as Registered Nurse Mauro Kaufmann, RN as Registered Nurse Holley Bouche, NP as Nurse Practitioner (Nurse Practitioner)  DIAGNOSIS: Breast cancer of lower-inner quadrant of right female breast   Staging form: Breast, AJCC 7th Edition     Clinical stage from 01/24/2015: Stage IIA (T2, N0, M0) - Unsigned   SUMMARY OF ONCOLOGIC HISTORY:   Breast cancer of lower-inner quadrant of right female breast   01/10/2015 Imaging Ultrasound breast: Right breast 4:00: 2.3 cm lesion, at 4:00 6 cm from nipple to 0.4 cm hypoechoic mass, left breast 1130; 2 cm from nipple 0.4 cm mass   01/12/2015 Initial Diagnosis Right breast biopsy: Invasive ductal carcinoma with DCIS, grade 3, ER 0%, PR 0%, HER-2 negative ratio 1.16, Ki-67 83%; left breast biopsy complex sclerosing lesion   01/19/2015 Breast MRI Right breast 5 x 6 x 3.8 cm lesion of non-masslike and nodular enhancement: Left breast 4.5x5x3.5 cm area of non-mass enhancement adjacent to complex sclerosing lesion extending 3.5 sinus lateral to biopsy, no lymph nodes   01/30/2015 Initial Biopsy Left Breast Biopsy: IDC with DCIS, ER 100%; PR 63%, Ki 67 13%; Her 2 Neg; Right: LOQ ER 51%, PR 45%   02/20/2015 - 03/13/2015 Neo-Adjuvant Chemotherapy Neoadjuvant Taxol and carboplatin weekly 4, stopped early because of psychiatric illness   03/15/2015 - 03/22/2015 Hospital Admission Severe generalized anxiety disorder, severe depression, small bitemporal and left frontal subarachnoid hemorrhage from a fall in the hospital    CHIEF COMPLIANT: Follow-up to discuss treatment plan  INTERVAL HISTORY: Grace Paul is a 80 year old above-mentioned history of lateral breast cancers could not tolerate  neo-adjuvant chemotherapy discontinued after 4 weeks of Taxol and carboplatin and having had prolonged hospitalization and nursing home placement is now here to discuss a final treatment plan. She is set up to undergo surgery July 15. She reports that she is doing a lot better since she came home she uses a cane to walk. In the hospital she had a subarachnoid hemorrhage and she is recovering well from that as well. She would like to undergo physical therapy to get stronger.  REVIEW OF SYSTEMS:   Constitutional: Denies fevers, chills or abnormal weight loss Eyes: Denies blurriness of vision Ears, nose, mouth, throat, and face: Denies mucositis or sore throat Respiratory: Denies cough, dyspnea or wheezes Cardiovascular: Denies palpitation, chest discomfort or lower extremity swelling Gastrointestinal:  Denies nausea, heartburn or change in bowel habits Skin: Denies abnormal skin rashes Lymphatics: Denies new lymphadenopathy or easy bruising Neurological:Denies numbness, tingling or new weaknesses Behavioral/Psych: Anxiety and depression  All other systems were reviewed with the patient and are negative.  I have reviewed the past medical history, past surgical history, social history and family history with the patient and they are unchanged from previous note.  ALLERGIES:  is allergic to gentian violet; clarithromycin; cleocin; compazine; epinephrine; and hyoscyamine sulfate.  MEDICATIONS:  Current Outpatient Prescriptions  Medication Sig Dispense Refill  . acetaminophen (TYLENOL) 325 MG tablet Take 650 mg by mouth every 6 (six) hours as needed for mild pain or moderate pain.    . ARIPiprazole (ABILIFY) 15 MG tablet Take 0.5 tablets (7.5 mg total) by mouth daily. 15 tablet 3  . esomeprazole (NEXIUM) 40 MG capsule Take 40 mg by mouth daily as needed.  For indigestion.    . feeding supplement, ENSURE ENLIVE, (ENSURE ENLIVE) LIQD Take 237 mLs by mouth 2 (two) times daily between meals. 60 Bottle  12  . lidocaine-prilocaine (EMLA) cream Apply 1 application topically as needed. 30 g 1  . LORazepam (ATIVAN) 1 MG tablet TAKE 1/2 TABLET BY MOUTH  3 TIMES DAILY AS NEEDED FOR ANXIETY  0  . meclizine (ANTIVERT) 12.5 MG tablet Take 12.5 mg by mouth 2 (two) times daily as needed for dizziness.   0  . neomycin-polymyxin-hydrocortisone (CORTISPORIN) 3.5-10000-1 otic suspension Place 3 drops into both ears daily as needed (when ear bothers her).   0  . oxyCODONE (OXY IR/ROXICODONE) 5 MG immediate release tablet Take 1 tablet (5 mg total) by mouth 2 (two) times daily as needed for moderate pain. 60 tablet 0  . Polyethyl Glycol-Propyl Glycol (SYSTANE OP) Place 1 drop into both eyes at bedtime.    . traZODone (DESYREL) 50 MG tablet Take 50 mg by mouth at bedtime.  0  . clonazePAM (KLONOPIN) 0.5 MG tablet Take 0.5 tablets (0.25 mg total) by mouth 2 (two) times daily. (Patient not taking: Reported on 04/24/2015) 30 tablet 3   No current facility-administered medications for this visit.    PHYSICAL EXAMINATION: ECOG PERFORMANCE STATUS: 1 - Symptomatic but completely ambulatory  Filed Vitals:   04/24/15 1050  BP: 160/90  Pulse: 90  Temp: 98.2 F (36.8 C)  Resp: 18   Filed Weights   04/24/15 1050  Weight: 113 lb 9.6 oz (51.529 kg)    GENERAL:alert, no distress and comfortable SKIN: skin color, texture, turgor are normal, no rashes or significant lesions EYES: normal, Conjunctiva are pink and non-injected, sclera clear OROPHARYNX:no exudate, no erythema and lips, buccal mucosa, and tongue normal  NECK: supple, thyroid normal size, non-tender, without nodularity LYMPH:  no palpable lymphadenopathy in the cervical, axillary or inguinal LUNGS: clear to auscultation and percussion with normal breathing effort HEART: regular rate & rhythm and no murmurs and no lower extremity edema ABDOMEN:abdomen soft, non-tender and normal bowel sounds Musculoskeletal:no cyanosis of digits and no clubbing  NEURO:  alert & oriented x 3 with fluent speech, no focal motor/sensory deficits   LABORATORY DATA:  I have reviewed the data as listed   Chemistry      Component Value Date/Time   NA 134* 03/21/2015 0442   NA 137 03/13/2015 0853   K 4.0 03/21/2015 0442   K 4.2 03/13/2015 0853   CL 96* 03/21/2015 0442   CO2 29 03/21/2015 0442   CO2 23 03/13/2015 0853   BUN 17 03/21/2015 0442   BUN 10.7 03/13/2015 0853   CREATININE 0.77 03/21/2015 0442   CREATININE 0.8 03/13/2015 0853      Component Value Date/Time   CALCIUM 8.8* 03/21/2015 0442   CALCIUM 9.3 03/13/2015 0853   ALKPHOS 67 03/13/2015 0853   AST 18 03/13/2015 0853   ALT 18 03/13/2015 0853   BILITOT 0.33 03/13/2015 0853       Lab Results  Component Value Date   WBC 4.8 03/21/2015   HGB 9.3* 03/21/2015   HCT 28.9* 03/21/2015   MCV 87.3 03/21/2015   PLT 152 03/21/2015   NEUTROABS 3.1 03/13/2015     ASSESSMENT & PLAN:  Breast cancer of lower-inner quadrant of right female breast Right breast 5 x 6 x 3.8 cm lesion of non-masslike and nodular enhancement: Invasive ductal carcinoma with DCIS, grade 3, ER 0%, PR 0%, HER-2 negative ratio 1.16, Ki-67 83% Right Breast LOQ:  01/30/15: DCIS involving the papillary lesion ER 51%, PR 45% Left breast 4.5x5x3.5 cm area of non-mass enhancement adjacent to complex sclerosing lesion extending 3.5 cm IDC with DCIS, ER 100%; PR 63%, Ki 67 13%; Her 2 Neg; Right: LOQ ER 51%, PR 45% Neo-adjuvant chemotherapy with Taxol and carboplatin weekly 4 started 02/20/2015 -03/13/2015 stopped due to psychiatric illness and hospitalization.  Treatment plan: Surgery with or without radiation. Since patient will undergo bilateral mastectomies, there is no role of antiestrogen therapy (for DCIS). The right breast invasive cancer is ER negative  Severe anxiety/depression: She met with our case managers and social workers and our Industrial/product designer. Return to clinic 1 week after surgery.  No orders of the defined types were  placed in this encounter.   The patient has a good understanding of the overall plan. she agrees with it. she will call with any problems that may develop before the next visit here.   Rulon Eisenmenger, MD     EKG

## 2015-04-24 NOTE — Assessment & Plan Note (Signed)
Right breast 5 x 6 x 3.8 cm lesion of non-masslike and nodular enhancement: Invasive ductal carcinoma with DCIS, grade 3, ER 0%, PR 0%, HER-2 negative ratio 1.16, Ki-67 83% Right Breast LOQ: 01/30/15: DCIS involving the papillary lesion ER 51%, PR 45% Left breast 4.5x5x3.5 cm area of non-mass enhancement adjacent to complex sclerosing lesion extending 3.5 cm IDC with DCIS, ER 100%; PR 63%, Ki 67 13%; Her 2 Neg; Right: LOQ ER 51%, PR 45% Neo-adjuvant chemotherapy with Taxol and carboplatin weekly 4 started 02/20/2015 -03/13/2015 stopped due to psychiatric illness and hospitalization.  Treatment plan: Surgery with or without radiation followed by antiestrogen therapy.

## 2015-04-24 NOTE — Telephone Encounter (Signed)
Appointments made and avs printed for patient °

## 2015-04-25 ENCOUNTER — Telehealth: Payer: Self-pay | Admitting: *Deleted

## 2015-04-25 NOTE — Telephone Encounter (Signed)
Spoke with patient concerning her follow up appointments with Dr. Doylene Canard and the psychiatrist.  Informed her she was supposed to see these physicians 2 weeks after discharge from the hospital.  She states she has seen Dr. Doylene Canard but not the psychiatrist.  Gave her the phone number to the psychiatrist and instructed her to call for the next available appointment.  She also had some concerns about her figure after surgery.  Gave her the number for [facility] to call for an appointment to be fitted for bras and breast forms for after surgery. Encouraged her to call with any concerns or needs.  Patient verbalized understanding.

## 2015-04-26 ENCOUNTER — Telehealth: Payer: Self-pay | Admitting: *Deleted

## 2015-04-26 NOTE — Telephone Encounter (Signed)
Spoke with patient and Jenny Reichmann (friend) to make her aware of an appointment with a psychiatrist.  She has had some trouble getting an appointment.  I was able to get her in with Dr. Adele Schilder on 06/05/15 at 8am. Encouraged her to call with questions, needs, or concerns.

## 2015-05-02 ENCOUNTER — Ambulatory Visit (HOSPITAL_COMMUNITY)
Admission: RE | Admit: 2015-05-02 | Discharge: 2015-05-02 | Disposition: A | Discharge status: Home / Self Care | Payer: Medicare Other | Source: Ambulatory Visit | Attending: General Surgery | Admitting: General Surgery

## 2015-05-02 ENCOUNTER — Encounter (HOSPITAL_COMMUNITY): Payer: Self-pay

## 2015-05-02 ENCOUNTER — Encounter (HOSPITAL_COMMUNITY)
Admission: RE | Admit: 2015-05-02 | Discharge: 2015-05-02 | Disposition: A | Discharge status: Home / Self Care | Payer: Medicare Other | Source: Ambulatory Visit | Attending: General Surgery | Admitting: General Surgery

## 2015-05-02 DIAGNOSIS — C50912 Malignant neoplasm of unspecified site of left female breast: Secondary | ICD-10-CM | POA: Diagnosis not present

## 2015-05-02 DIAGNOSIS — Z01812 Encounter for preprocedural laboratory examination: Secondary | ICD-10-CM | POA: Diagnosis not present

## 2015-05-02 DIAGNOSIS — C50911 Malignant neoplasm of unspecified site of right female breast: Secondary | ICD-10-CM | POA: Insufficient documentation

## 2015-05-02 DIAGNOSIS — Z01818 Encounter for other preprocedural examination: Secondary | ICD-10-CM | POA: Diagnosis present

## 2015-05-02 DIAGNOSIS — Z01811 Encounter for preprocedural respiratory examination: Secondary | ICD-10-CM

## 2015-05-02 HISTORY — DX: Other specified arthropod-borne viral fevers: A93.8

## 2015-05-02 LAB — COMPREHENSIVE METABOLIC PANEL
ALK PHOS: 74 U/L (ref 38–126)
ALT: 17 U/L (ref 14–54)
AST: 20 U/L (ref 15–41)
Albumin: 4.2 g/dL (ref 3.5–5.0)
Anion gap: 9 (ref 5–15)
BUN: 18 mg/dL (ref 6–20)
CALCIUM: 9.5 mg/dL (ref 8.9–10.3)
CO2: 25 mmol/L (ref 22–32)
CREATININE: 0.83 mg/dL (ref 0.44–1.00)
Chloride: 105 mmol/L (ref 101–111)
GFR calc non Af Amer: >60 mL/min (ref >60)
GLUCOSE: 104 mg/dL — AB (ref 65–99)
POTASSIUM: 4 mmol/L (ref 3.5–5.1)
SODIUM: 139 mmol/L (ref 135–145)
Total Bilirubin: 0.4 mg/dL (ref 0.3–1.2)
Total Protein: 6.5 g/dL (ref 6.5–8.1)

## 2015-05-02 LAB — URINALYSIS, ROUTINE W REFLEX MICROSCOPIC
BILIRUBIN URINE: NEGATIVE
Glucose, UA: NEGATIVE mg/dL
Ketones, ur: NEGATIVE mg/dL
NITRITE: NEGATIVE
PH: 5 (ref 5.0–8.0)
Protein, ur: NEGATIVE mg/dL
Specific Gravity, Urine: 1.025 (ref 1.005–1.030)
Urobilinogen, UA: 0.2 mg/dL (ref 0.0–1.0)

## 2015-05-02 LAB — CBC WITH DIFFERENTIAL/PLATELET
BASOS ABS: 0 10*3/uL (ref 0.0–0.1)
Basophils Relative: 0 % (ref 0–1)
EOS ABS: 0.1 10*3/uL (ref 0.0–0.7)
Eosinophils Relative: 1 % (ref 0–5)
HCT: 40.4 % (ref 36.0–46.0)
Hemoglobin: 13.5 g/dL (ref 12.0–15.0)
LYMPHS ABS: 2 10*3/uL (ref 0.7–4.0)
Lymphocytes Relative: 21 % (ref 12–46)
MCH: 30.8 pg (ref 26.0–34.0)
MCHC: 33.4 g/dL (ref 30.0–36.0)
MCV: 92.2 fL (ref 78.0–100.0)
MONO ABS: 0.6 10*3/uL (ref 0.1–1.0)
MONOS PCT: 6 % (ref 3–12)
NEUTROS PCT: 72 % (ref 43–77)
Neutro Abs: 7 10*3/uL (ref 1.7–7.7)
PLATELETS: 252 10*3/uL (ref 150–400)
RBC: 4.38 MIL/uL (ref 3.87–5.11)
RDW: 15 % (ref 11.5–15.5)
WBC: 9.7 10*3/uL (ref 4.0–10.5)

## 2015-05-02 LAB — PROTIME-INR
INR: 0.96 (ref 0.00–1.49)
PROTHROMBIN TIME: 13 s (ref 11.6–15.2)

## 2015-05-02 LAB — APTT: aPTT: 36 seconds (ref 24–37)

## 2015-05-02 LAB — URINE MICROSCOPIC-ADD ON

## 2015-05-02 NOTE — Pre-Procedure Instructions (Signed)
TALEEN PROSSER  05/02/2015      [facility] [place], [place] [address] Phone: [phone] Fax: [phone]    Your procedure is scheduled on Friday, July 15th .  Report to [facility] Admitting at 8:30 AM  Call this number if you have problems the morning of surgery:  [phone]   Remember:  Do not eat food or drink liquids after midnight Thursday.  Take these medicines the morning of surgery with A SIP OF WATER : Nexium, Ativan, Meclizine-if needed, Oxycodone   Do not wear jewelry, make-up or nail polish.  Do not wear lotions, powders, or perfumes.  You may NOT wear deodorant the day of surgery.  Do not shave underarms & legs 48 hours prior to surgery.     Do not bring valuables to the hospital.  [facility] is not responsible for any belongings or valuables. Contacts, dentures or bridgework may not be worn into surgery.  Leave your suitcase in the car.  After surgery it may be brought to your room. For patients admitted to the hospital, discharge time will be determined by your treatment team.  Name and phone number of your driver:     Special instructions:  "Preparing for Surgery" instruction sheet.  Please read over the following fact sheets that you were given. Pain Booklet, Coughing and Deep Breathing and Surgical Site Infection Prevention

## 2015-05-02 NOTE — Progress Notes (Addendum)
Patient was dx in March 2016 with breast cancer.  Unable to handle the chemo, has had only 4 treatments.  States she sees Dr. Doylene Canard...has had echo 2005 Spoke with staff @ [facility] to have Dr. Dalbert Batman looked at UA results.  DA

## 2015-05-03 MED ORDER — CEFAZOLIN SODIUM-DEXTROSE 2-3 GM-% IV SOLR
2.0000 g | INTRAVENOUS | Status: AC
  Administered 2015-05-04: 2 g via INTRAVENOUS
  Filled 2015-05-03: qty 50

## 2015-05-03 NOTE — Anesthesia Preprocedure Evaluation (Addendum)
Anesthesia Evaluation  Patient identified by MRN, date of birth, ID band Patient awake    Reviewed: Allergy & Precautions, NPO status , Patient's Chart, lab work & pertinent test results, reviewed documented beta blocker date and time   History of Anesthesia Complications (+) PONV  Airway Mallampati: II  TM Distance: >3 FB Neck ROM: Full    Dental  (+) Poor Dentition, Missing, Dental Advisory Given, Partial Upper, Loose   Pulmonary former smoker (quit 1975 28 pack year),  breath sounds clear to auscultation        Cardiovascular hypertension, Pt. on medications Rhythm:Regular  EKG OK   Neuro/Psych Anxiety Depression    GI/Hepatic GERD-  Medicated,(+)     substance abuse (pain meds and ETOH)  alcohol use,   Endo/Other  negative endocrine ROS  Renal/GU negative Renal ROS     Musculoskeletal  (+) Fibromyalgia -  Abdominal (+)  Abdomen: soft.    Peds  Hematology 13/40   Anesthesia Other Findings Missing some upper teeth.  Advisory given  Reproductive/Obstetrics                          Anesthesia Physical Anesthesia Plan  ASA: III  Anesthesia Plan: General   Post-op Pain Management: GA combined w/ Regional for post-op pain   Induction: Intravenous  Airway Management Planned: Oral ETT  Additional Equipment:   Intra-op Plan:   Post-operative Plan: Extubation in OR  Informed Consent: I have reviewed the patients History and Physical, chart, labs and discussed the procedure including the risks, benefits and alternatives for the proposed anesthesia with the patient or authorized representative who has indicated his/her understanding and acceptance.     Plan Discussed with:   Anesthesia Plan Comments: (Will offer PEC blocks)        Anesthesia Quick Evaluation
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
## 2015-05-04 ENCOUNTER — Ambulatory Visit (HOSPITAL_COMMUNITY): Payer: Medicare Other | Admitting: Anesthesiology

## 2015-05-04 ENCOUNTER — Ambulatory Visit (HOSPITAL_COMMUNITY)
Admission: RE | Admit: 2015-05-04 | Discharge: 2015-05-04 | Disposition: A | Discharge status: Home / Self Care | Payer: Medicare Other | Source: Ambulatory Visit | Attending: General Surgery | Admitting: General Surgery

## 2015-05-04 ENCOUNTER — Encounter (HOSPITAL_COMMUNITY)
Admission: RE | Disposition: A | Discharge status: Home / Self Care | Payer: Self-pay | Source: Ambulatory Visit | Attending: General Surgery

## 2015-05-04 ENCOUNTER — Observation Stay (HOSPITAL_COMMUNITY)
Admission: RE | Admit: 2015-05-04 | Discharge: 2015-05-08 | Disposition: A | Discharge status: Home / Self Care | Payer: Medicare Other | Source: Ambulatory Visit | Attending: General Surgery | Admitting: General Surgery

## 2015-05-04 ENCOUNTER — Encounter (HOSPITAL_COMMUNITY): Payer: Self-pay | Admitting: *Deleted

## 2015-05-04 DIAGNOSIS — Z888 Allergy status to other drugs, medicaments and biological substances status: Secondary | ICD-10-CM | POA: Insufficient documentation

## 2015-05-04 DIAGNOSIS — K219 Gastro-esophageal reflux disease without esophagitis: Secondary | ICD-10-CM | POA: Insufficient documentation

## 2015-05-04 DIAGNOSIS — Z881 Allergy status to other antibiotic agents status: Secondary | ICD-10-CM | POA: Insufficient documentation

## 2015-05-04 DIAGNOSIS — Z9221 Personal history of antineoplastic chemotherapy: Secondary | ICD-10-CM | POA: Insufficient documentation

## 2015-05-04 DIAGNOSIS — F4310 Post-traumatic stress disorder, unspecified: Secondary | ICD-10-CM | POA: Diagnosis not present

## 2015-05-04 DIAGNOSIS — Z171 Estrogen receptor negative status [ER-]: Secondary | ICD-10-CM | POA: Insufficient documentation

## 2015-05-04 DIAGNOSIS — I10 Essential (primary) hypertension: Secondary | ICD-10-CM | POA: Insufficient documentation

## 2015-05-04 DIAGNOSIS — F419 Anxiety disorder, unspecified: Secondary | ICD-10-CM | POA: Insufficient documentation

## 2015-05-04 DIAGNOSIS — I609 Nontraumatic subarachnoid hemorrhage, unspecified: Secondary | ICD-10-CM | POA: Insufficient documentation

## 2015-05-04 DIAGNOSIS — C50912 Malignant neoplasm of unspecified site of left female breast: Secondary | ICD-10-CM

## 2015-05-04 DIAGNOSIS — C50911 Malignant neoplasm of unspecified site of right female breast: Secondary | ICD-10-CM

## 2015-05-04 DIAGNOSIS — Z87891 Personal history of nicotine dependence: Secondary | ICD-10-CM | POA: Insufficient documentation

## 2015-05-04 DIAGNOSIS — C50511 Malignant neoplasm of lower-outer quadrant of right female breast: Secondary | ICD-10-CM | POA: Diagnosis present

## 2015-05-04 DIAGNOSIS — R262 Difficulty in walking, not elsewhere classified: Secondary | ICD-10-CM | POA: Insufficient documentation

## 2015-05-04 DIAGNOSIS — R531 Weakness: Secondary | ICD-10-CM | POA: Insufficient documentation

## 2015-05-04 DIAGNOSIS — Z79899 Other long term (current) drug therapy: Secondary | ICD-10-CM | POA: Diagnosis not present

## 2015-05-04 DIAGNOSIS — C50411 Malignant neoplasm of upper-outer quadrant of right female breast: Secondary | ICD-10-CM | POA: Diagnosis not present

## 2015-05-04 HISTORY — PX: PORT-A-CATH REMOVAL: SHX5289

## 2015-05-04 HISTORY — PX: MASTECTOMY COMPLETE / SIMPLE W/ SENTINEL NODE BIOPSY: SUR846

## 2015-05-04 HISTORY — PX: MASTECTOMY W/ SENTINEL NODE BIOPSY: SHX2001

## 2015-05-04 SURGERY — MASTECTOMY WITH SENTINEL LYMPH NODE BIOPSY
Anesthesia: General | Site: Breast

## 2015-05-04 MED ORDER — ROCURONIUM BROMIDE 100 MG/10ML IV SOLN
INTRAVENOUS | Status: DC | PRN
  Administered 2015-05-04: 30 mg via INTRAVENOUS

## 2015-05-04 MED ORDER — MIDAZOLAM HCL 2 MG/2ML IJ SOLN
INTRAMUSCULAR | Status: AC
  Filled 2015-05-04: qty 2

## 2015-05-04 MED ORDER — FENTANYL CITRATE (PF) 250 MCG/5ML IJ SOLN
INTRAMUSCULAR | Status: AC
  Filled 2015-05-04: qty 5

## 2015-05-04 MED ORDER — ASPIRIN EC 81 MG PO TBEC
81.0000 mg | DELAYED_RELEASE_TABLET | Freq: Every day | ORAL | Status: DC
  Administered 2015-05-05 – 2015-05-08 (×4): 81 mg via ORAL
  Filled 2015-05-04 (×7): qty 1

## 2015-05-04 MED ORDER — ARTIFICIAL TEARS OP OINT
TOPICAL_OINTMENT | OPHTHALMIC | Status: AC
  Filled 2015-05-04: qty 3.5

## 2015-05-04 MED ORDER — TECHNETIUM TC 99M SULFUR COLLOID FILTERED
2.0000 | Freq: Once | INTRAVENOUS | Status: AC | PRN
  Administered 2015-05-04: 2 via INTRADERMAL

## 2015-05-04 MED ORDER — GLYCOPYRROLATE 0.2 MG/ML IJ SOLN
INTRAMUSCULAR | Status: AC
  Filled 2015-05-04: qty 3

## 2015-05-04 MED ORDER — BOOST / RESOURCE BREEZE PO LIQD
1.0000 | Freq: Three times a day (TID) | ORAL | Status: DC
  Administered 2015-05-04 – 2015-05-05 (×3): 1 via ORAL

## 2015-05-04 MED ORDER — MECLIZINE HCL 12.5 MG PO TABS
12.5000 mg | ORAL_TABLET | Freq: Two times a day (BID) | ORAL | Status: DC | PRN
  Administered 2015-05-05 – 2015-05-08 (×6): 12.5 mg via ORAL
  Filled 2015-05-04 (×9): qty 1

## 2015-05-04 MED ORDER — NEOSTIGMINE METHYLSULFATE 10 MG/10ML IV SOLN
INTRAVENOUS | Status: DC | PRN
  Administered 2015-05-04: 4 mg via INTRAVENOUS

## 2015-05-04 MED ORDER — SODIUM CHLORIDE 0.9 % IJ SOLN
INTRAMUSCULAR | Status: DC | PRN
  Administered 2015-05-04: 10 mL via INTRAMUSCULAR

## 2015-05-04 MED ORDER — MIDAZOLAM HCL 5 MG/5ML IJ SOLN
INTRAMUSCULAR | Status: DC | PRN
  Administered 2015-05-04: 1 mg via INTRAVENOUS

## 2015-05-04 MED ORDER — PROPOFOL 10 MG/ML IV BOLUS
INTRAVENOUS | Status: AC
  Filled 2015-05-04: qty 20

## 2015-05-04 MED ORDER — HYDROMORPHONE HCL 1 MG/ML IJ SOLN
1.0000 mg | INTRAMUSCULAR | Status: DC | PRN
  Administered 2015-05-04: 1 mg via INTRAVENOUS
  Filled 2015-05-04 (×2): qty 1

## 2015-05-04 MED ORDER — POTASSIUM CHLORIDE IN NACL 20-0.9 MEQ/L-% IV SOLN
INTRAVENOUS | Status: DC
  Administered 2015-05-04: 15:00:00 via INTRAVENOUS
  Administered 2015-05-05: 1 mL via INTRAVENOUS
  Filled 2015-05-04 (×5): qty 1000

## 2015-05-04 MED ORDER — MIDAZOLAM HCL 2 MG/2ML IJ SOLN
INTRAMUSCULAR | Status: AC
  Administered 2015-05-04: 1 mg
  Filled 2015-05-04: qty 2

## 2015-05-04 MED ORDER — CEFAZOLIN SODIUM-DEXTROSE 2-3 GM-% IV SOLR
2.0000 g | Freq: Three times a day (TID) | INTRAVENOUS | Status: AC
  Administered 2015-05-04 – 2015-05-05 (×3): 2 g via INTRAVENOUS
  Filled 2015-05-04 (×3): qty 50

## 2015-05-04 MED ORDER — HYDROCODONE-ACETAMINOPHEN 5-325 MG PO TABS
1.0000 | ORAL_TABLET | ORAL | Status: DC | PRN
  Administered 2015-05-05 – 2015-05-07 (×4): 1 via ORAL
  Administered 2015-05-07: 2 via ORAL
  Filled 2015-05-04 (×2): qty 1
  Filled 2015-05-04: qty 2
  Filled 2015-05-04 (×3): qty 1

## 2015-05-04 MED ORDER — FENTANYL CITRATE (PF) 100 MCG/2ML IJ SOLN
INTRAMUSCULAR | Status: DC | PRN
  Administered 2015-05-04: 100 ug via INTRAVENOUS
  Administered 2015-05-04 (×2): 50 ug via INTRAVENOUS

## 2015-05-04 MED ORDER — GLYCOPYRROLATE 0.2 MG/ML IJ SOLN
INTRAMUSCULAR | Status: DC | PRN
  Administered 2015-05-04: 0.6 mg via INTRAVENOUS

## 2015-05-04 MED ORDER — ROCURONIUM BROMIDE 50 MG/5ML IV SOLN
INTRAVENOUS | Status: AC
  Filled 2015-05-04: qty 1

## 2015-05-04 MED ORDER — CHLORHEXIDINE GLUCONATE 4 % EX LIQD: 1.0000 "application " | Freq: Once | CUTANEOUS | Status: DC

## 2015-05-04 MED ORDER — PROPOFOL 10 MG/ML IV BOLUS
INTRAVENOUS | Status: DC | PRN
  Administered 2015-05-04: 50 mg via INTRAVENOUS

## 2015-05-04 MED ORDER — FENTANYL CITRATE (PF) 100 MCG/2ML IJ SOLN
25.0000 ug | INTRAMUSCULAR | Status: DC | PRN
  Administered 2015-05-04: 50 ug via INTRAVENOUS

## 2015-05-04 MED ORDER — METOPROLOL SUCCINATE ER 25 MG PO TB24
25.0000 mg | ORAL_TABLET | Freq: Two times a day (BID) | ORAL | Status: DC
  Administered 2015-05-05 – 2015-05-08 (×7): 25 mg via ORAL
  Filled 2015-05-04 (×8): qty 1

## 2015-05-04 MED ORDER — ONDANSETRON HCL 4 MG/2ML IJ SOLN
4.0000 mg | Freq: Four times a day (QID) | INTRAMUSCULAR | Status: DC | PRN
  Administered 2015-05-04 – 2015-05-07 (×2): 4 mg via INTRAVENOUS
  Filled 2015-05-04 (×4): qty 2

## 2015-05-04 MED ORDER — METHYLENE BLUE 1 % INJ SOLN
INTRAMUSCULAR | Status: AC
  Filled 2015-05-04: qty 10

## 2015-05-04 MED ORDER — NEOSTIGMINE METHYLSULFATE 10 MG/10ML IV SOLN
INTRAVENOUS | Status: AC
  Filled 2015-05-04: qty 1

## 2015-05-04 MED ORDER — DEXAMETHASONE SODIUM PHOSPHATE 4 MG/ML IJ SOLN
INTRAMUSCULAR | Status: AC
  Filled 2015-05-04: qty 2

## 2015-05-04 MED ORDER — NEOMYCIN-POLYMYXIN-HC 3.5-10000-1 OT SUSP
1.0000 [drp] | Freq: Three times a day (TID) | OTIC | Status: DC
  Administered 2015-05-04 – 2015-05-08 (×3): 1 [drp] via OTIC
  Filled 2015-05-04: qty 10

## 2015-05-04 MED ORDER — LORAZEPAM 0.5 MG PO TABS
0.5000 mg | ORAL_TABLET | Freq: Four times a day (QID) | ORAL | Status: DC | PRN
  Administered 2015-05-05 – 2015-05-08 (×6): 0.5 mg via ORAL
  Filled 2015-05-04 (×7): qty 1

## 2015-05-04 MED ORDER — TEMAZEPAM 15 MG PO CAPS
15.0000 mg | ORAL_CAPSULE | Freq: Every day | ORAL | Status: DC
  Administered 2015-05-04 – 2015-05-07 (×4): 15 mg via ORAL
  Filled 2015-05-04 (×4): qty 1

## 2015-05-04 MED ORDER — FENTANYL CITRATE (PF) 100 MCG/2ML IJ SOLN
INTRAMUSCULAR | Status: AC
  Filled 2015-05-04: qty 2

## 2015-05-04 MED ORDER — ENOXAPARIN SODIUM 40 MG/0.4ML ~~LOC~~ SOLN
40.0000 mg | SUBCUTANEOUS | Status: DC
  Administered 2015-05-05 – 2015-05-08 (×4): 40 mg via SUBCUTANEOUS
  Filled 2015-05-04 (×4): qty 0.4

## 2015-05-04 MED ORDER — 0.9 % SODIUM CHLORIDE (POUR BTL) OPTIME
TOPICAL | Status: DC | PRN
  Administered 2015-05-04 (×3): 1000 mL

## 2015-05-04 MED ORDER — DEXTROSE 5 % IV SOLN
INTRAVENOUS | Status: DC | PRN
  Administered 2015-05-04: 11:00:00 via INTRAVENOUS

## 2015-05-04 MED ORDER — DOCUSATE SODIUM 100 MG PO CAPS: 100.0000 mg | ORAL_CAPSULE | Freq: Every day | ORAL | Status: DC | PRN

## 2015-05-04 MED ORDER — ACETAMINOPHEN 325 MG PO TABS
650.0000 mg | ORAL_TABLET | Freq: Four times a day (QID) | ORAL | Status: DC | PRN
Start: 2015-05-04 — End: 2015-05-08
  Administered 2015-05-04 – 2015-05-05 (×2): 650 mg via ORAL
  Filled 2015-05-04 (×2): qty 2

## 2015-05-04 MED ORDER — LACTATED RINGERS IV SOLN
INTRAVENOUS | Status: DC
  Administered 2015-05-04 (×3): via INTRAVENOUS

## 2015-05-04 MED ORDER — PANTOPRAZOLE SODIUM 40 MG PO TBEC
40.0000 mg | DELAYED_RELEASE_TABLET | Freq: Every day | ORAL | Status: DC
  Administered 2015-05-04 – 2015-05-08 (×5): 40 mg via ORAL
  Filled 2015-05-04 (×5): qty 1

## 2015-05-04 MED ORDER — ONDANSETRON HCL 4 MG/2ML IJ SOLN
INTRAMUSCULAR | Status: DC | PRN
  Administered 2015-05-04: 4 mg via INTRAVENOUS

## 2015-05-04 MED ORDER — BUPIVACAINE-EPINEPHRINE (PF) 0.25% -1:200000 IJ SOLN
INTRAMUSCULAR | Status: DC | PRN
  Administered 2015-05-04 (×2): 25 mL

## 2015-05-04 MED ORDER — ONDANSETRON HCL 4 MG/2ML IJ SOLN
INTRAMUSCULAR | Status: AC
  Filled 2015-05-04: qty 2

## 2015-05-04 MED ORDER — PHENYLEPHRINE HCL 10 MG/ML IJ SOLN
INTRAMUSCULAR | Status: DC | PRN
  Administered 2015-05-04 (×3): 80 ug via INTRAVENOUS
  Administered 2015-05-04 (×2): 40 ug via INTRAVENOUS
  Administered 2015-05-04 (×2): 80 ug via INTRAVENOUS
  Administered 2015-05-04: 40 ug via INTRAVENOUS

## 2015-05-04 MED ORDER — DEXAMETHASONE SODIUM PHOSPHATE 4 MG/ML IJ SOLN
INTRAMUSCULAR | Status: DC | PRN
  Administered 2015-05-04: 8 mg via INTRAVENOUS

## 2015-05-04 MED ORDER — TRAZODONE HCL 50 MG PO TABS
50.0000 mg | ORAL_TABLET | Freq: Every day | ORAL | Status: DC
  Filled 2015-05-04: qty 1

## 2015-05-04 MED ORDER — HYDROCODONE-ACETAMINOPHEN 5-325 MG PO TABS: 1.0000 | ORAL_TABLET | Freq: Four times a day (QID) | ORAL | Status: DC | PRN

## 2015-05-04 MED ORDER — ONDANSETRON HCL 4 MG PO TABS
4.0000 mg | ORAL_TABLET | Freq: Four times a day (QID) | ORAL | Status: DC | PRN
  Administered 2015-05-05: 4 mg via ORAL
  Filled 2015-05-04 (×2): qty 1

## 2015-05-04 MED ORDER — AMLODIPINE BESYLATE 2.5 MG PO TABS
2.5000 mg | ORAL_TABLET | Freq: Every morning | ORAL | Status: DC
  Administered 2015-05-05 – 2015-05-08 (×4): 2.5 mg via ORAL
  Filled 2015-05-04 (×4): qty 1

## 2015-05-04 MED ORDER — ARIPIPRAZOLE 15 MG PO TABS
7.5000 mg | ORAL_TABLET | Freq: Every day | ORAL | Status: DC
  Filled 2015-05-04: qty 1

## 2015-05-04 MED ORDER — MEPERIDINE HCL 25 MG/ML IJ SOLN: 6.2500 mg | INTRAMUSCULAR | Status: DC | PRN

## 2015-05-04 MED ORDER — LIDOCAINE-EPINEPHRINE (PF) 1 %-1:200000 IJ SOLN
INTRAMUSCULAR | Status: AC
  Filled 2015-05-04: qty 10

## 2015-05-04 MED ORDER — FENTANYL CITRATE (PF) 100 MCG/2ML IJ SOLN
INTRAMUSCULAR | Status: AC
  Administered 2015-05-04: 100 ug
  Filled 2015-05-04: qty 2

## 2015-05-04 SURGICAL SUPPLY — 61 items
ADH SKN CLS APL DERMABOND .7 (GAUZE/BANDAGES/DRESSINGS) ×2
APPLIER CLIP 9.375 MED OPEN (MISCELLANEOUS) ×3
APR CLP MED 9.3 20 MLT OPN (MISCELLANEOUS) ×2
BINDER BREAST LRG (GAUZE/BANDAGES/DRESSINGS) ×1 IMPLANT
BINDER BREAST XLRG (GAUZE/BANDAGES/DRESSINGS) IMPLANT
CANISTER SUCTION 2500CC (MISCELLANEOUS) ×3 IMPLANT
CHLORAPREP W/TINT 26ML (MISCELLANEOUS) ×4 IMPLANT
CLIP APPLIE 9.375 MED OPEN (MISCELLANEOUS) ×2 IMPLANT
CONT SPEC 4OZ CLIKSEAL STRL BL (MISCELLANEOUS) ×6 IMPLANT
COVER PROBE W GEL 5X96 (DRAPES) ×3 IMPLANT
COVER SURGICAL LIGHT HANDLE (MISCELLANEOUS) ×3 IMPLANT
DERMABOND ADVANCED (GAUZE/BANDAGES/DRESSINGS) ×1
DERMABOND ADVANCED .7 DNX12 (GAUZE/BANDAGES/DRESSINGS) ×2 IMPLANT
DEVICE DISSECT PLASMABLAD 3.0S (MISCELLANEOUS) ×2 IMPLANT
DRAIN CHANNEL 19F RND (DRAIN) ×6 IMPLANT
DRAPE CHEST BREAST 15X10 FENES (DRAPES) ×3 IMPLANT
DRAPE PROXIMA HALF (DRAPES) ×5 IMPLANT
DRAPE UTILITY XL STRL (DRAPES) ×6 IMPLANT
ELECT BLADE 4.0 EZ CLEAN MEGAD (MISCELLANEOUS) ×3
ELECT CAUTERY BLADE 6.4 (BLADE) ×3 IMPLANT
ELECT REM PT RETURN 9FT ADLT (ELECTROSURGICAL) ×6
ELECTRODE BLDE 4.0 EZ CLN MEGD (MISCELLANEOUS) ×2 IMPLANT
ELECTRODE REM PT RTRN 9FT ADLT (ELECTROSURGICAL) ×4 IMPLANT
EVACUATOR SILICONE 100CC (DRAIN) ×6 IMPLANT
GAUZE SPONGE 4X4 12PLY STRL (GAUZE/BANDAGES/DRESSINGS) ×1 IMPLANT
GAUZE SPONGE 4X4 16PLY XRAY LF (GAUZE/BANDAGES/DRESSINGS) ×2 IMPLANT
GLOVE BIO SURGEON STRL SZ7 (GLOVE) ×1 IMPLANT
GLOVE BIO SURGEON STRL SZ7.5 (GLOVE) ×2 IMPLANT
GLOVE BIOGEL PI IND STRL 7.0 (GLOVE) IMPLANT
GLOVE BIOGEL PI IND STRL 7.5 (GLOVE) IMPLANT
GLOVE BIOGEL PI INDICATOR 7.0 (GLOVE) ×1
GLOVE BIOGEL PI INDICATOR 7.5 (GLOVE) ×3
GLOVE EUDERMIC 7 POWDERFREE (GLOVE) ×3 IMPLANT
GOWN STRL REUS W/ TWL LRG LVL3 (GOWN DISPOSABLE) ×4 IMPLANT
GOWN STRL REUS W/ TWL XL LVL3 (GOWN DISPOSABLE) ×2 IMPLANT
GOWN STRL REUS W/TWL LRG LVL3 (GOWN DISPOSABLE) ×12
GOWN STRL REUS W/TWL XL LVL3 (GOWN DISPOSABLE) ×3
KIT BASIN OR (CUSTOM PROCEDURE TRAY) ×3 IMPLANT
KIT ROOM TURNOVER OR (KITS) ×3 IMPLANT
NDL 18GX1X1/2 (RX/OR ONLY) (NEEDLE) ×2 IMPLANT
NDL HYPO 25GX1X1/2 BEV (NEEDLE) ×2 IMPLANT
NEEDLE 18GX1X1/2 (RX/OR ONLY) (NEEDLE) ×3 IMPLANT
NEEDLE HYPO 25GX1X1/2 BEV (NEEDLE) ×3 IMPLANT
NS IRRIG 1000ML POUR BTL (IV SOLUTION) ×5 IMPLANT
PACK GENERAL/GYN (CUSTOM PROCEDURE TRAY) ×3 IMPLANT
PAD ABD 8X10 STRL (GAUZE/BANDAGES/DRESSINGS) ×1 IMPLANT
PAD ARMBOARD 7.5X6 YLW CONV (MISCELLANEOUS) ×4 IMPLANT
PENCIL BUTTON HOLSTER BLD 10FT (ELECTRODE) ×3 IMPLANT
PLASMABLADE 3.0S (MISCELLANEOUS) ×3
SPECIMEN JAR X LARGE (MISCELLANEOUS) ×4 IMPLANT
SPONGE LAP 18X18 X RAY DECT (DISPOSABLE) ×2 IMPLANT
SUT ETHILON 3 0 FSL (SUTURE) ×4 IMPLANT
SUT MNCRL AB 4-0 PS2 18 (SUTURE) ×3 IMPLANT
SUT SILK 2 0 FS (SUTURE) ×3 IMPLANT
SUT VIC AB 3-0 SH 18 (SUTURE) ×4 IMPLANT
SYR CONTROL 10ML LL (SYRINGE) ×3 IMPLANT
TAPE CLOTH SURG 4X10 WHT LF (GAUZE/BANDAGES/DRESSINGS) ×1 IMPLANT
TOWEL OR 17X24 6PK STRL BLUE (TOWEL DISPOSABLE) ×3 IMPLANT
TOWEL OR 17X26 10 PK STRL BLUE (TOWEL DISPOSABLE) ×2 IMPLANT
TRAY FOLEY W/METER SILVER 14FR (SET/KITS/TRAYS/PACK) ×1 IMPLANT
TUBE CONNECTING 12X1/4 (SUCTIONS) ×3 IMPLANT

## 2015-05-04 NOTE — Progress Notes (Signed)
[facility]  Patient Status: Active (receiving services up to time of hospitalization)  [facility] is providing the following services: PT and ST  If patient discharges after hours, please call [phone].   Grace Paul 05/04/2015, 4:29 PM

## 2015-05-04 NOTE — Anesthesia Postprocedure Evaluation (Signed)
  Anesthesia Post-op Note  Patient: Grace Paul  Procedure(s) Performed: Procedure(s): BILATERAL MASTECTOMY WITH BILATERAL  SENTINEL LYMPH NODE BIOPSY (Bilateral) REMOVAL PORT-A-CATH (N/A)  Patient Location: PACU  Anesthesia Type:General and GA combined with regional for post-op pain  Level of Consciousness: awake and alert   Airway and Oxygen Therapy: Patient Spontanous Breathing  Post-op Pain: mild  Post-op Assessment: Post-op Vital signs reviewed, Patient's Cardiovascular Status Stable, Respiratory Function Stable, Patent Airway and No signs of Nausea or vomiting              Post-op Vital Signs: Reviewed and stable  Last Vitals:  Filed Vitals:   05/04/15 1350  BP: 144/67  Pulse: 77  Temp:   Resp: 10    Complications: No apparent anesthesia complications

## 2015-05-04 NOTE — Anesthesia Procedure Notes (Addendum)
Anesthesia Regional Block:  Pectoralis block  Pre-Anesthetic Checklist: ,, timeout performed, Correct Patient, Correct Site, Correct Laterality, Correct Procedure, Correct Position, site marked, Risks and benefits discussed,  Surgical consent,  Pre-op evaluation,  At surgeon's request and post-op pain management  Laterality: Left and Right  Prep: Maximum Sterile Barrier Precautions used and chloraprep       Needles:  Injection technique: Single-shot  Needle Type: Echogenic Stimulator Needle     Needle Length: 9cm 9 cm Needle Gauge: 21 and 21 G    Additional Needles:  Procedures: ultrasound guided (picture in chart) Pectoralis block Narrative:  Start time: 05/04/2015 9:52 AM End time: 05/04/2015 10:13 AM Injection made incrementally with aspirations every 5 mL. Anesthesiologist: Alexis Frock  Additional Notes: Bilateral PEC Blocks done with .3% marcaine 70ml each side.  Tolerated procedure well.     Procedure Name: Intubation Date/Time: 05/04/2015 10:42 AM Performed by: Williemae Area B Pre-anesthesia Checklist: Patient identified, Emergency Drugs available, Suction available and Patient being monitored Patient Re-evaluated:Patient Re-evaluated prior to inductionOxygen Delivery Method: Circle system utilized Preoxygenation: Pre-oxygenation with 100% oxygen Intubation Type: IV induction Ventilation: Mask ventilation without difficulty Laryngoscope Size: Mac and 3 Grade View: Grade II Tube type: Oral Tube size: 7.5 mm Number of attempts: 1 (Dr. Tresa Moore) Airway Equipment and Method: Stylet Secured at: 21 (cm at teeth) cm Tube secured with: Tape Dental Injury: Teeth and Oropharynx as per pre-operative assessment  Comments: Suture tie to right canine which patient had emphasized was loose.

## 2015-05-04 NOTE — Transfer of Care (Signed)
Immediate Anesthesia Transfer of Care Note  Patient: Grace Paul  Procedure(s) Performed: Procedure(s): BILATERAL MASTECTOMY WITH BILATERAL  SENTINEL LYMPH NODE BIOPSY (Bilateral) REMOVAL PORT-A-CATH (N/A)  Patient Location: PACU  Anesthesia Type:General  Level of Consciousness: awake, alert  and patient cooperative  Airway & Oxygen Therapy: Patient Spontanous Breathing and Patient connected to nasal cannula oxygen  Post-op Assessment: Report given to RN, Post -op Vital signs reviewed and stable and Patient moving all extremities  Post vital signs: Reviewed and stable  Last Vitals:  Filed Vitals:   05/04/15 1015  BP: 124/50  Pulse: 89  Temp:   Resp: 18    Complications: No apparent anesthesia complications

## 2015-05-04 NOTE — Op Note (Signed)
Patient Name:           Grace Paul   Date of Surgery:        05/04/2015  Pre op Diagnosis:      Multifocal invasive cancer right breast, invasive cancer left breast, status post neo-adjuvant chemotherapy  Post op Diagnosis:    Multifocal invasive cancer right breast, invasive cancer left breast, status post neo-adjuvant chemotherapy  Procedure:                 Inject blue dye right breast                                      Inject blue dye left breast                                      Removal of Port-A-Cath                                      Right total mastectomy with right axillary sentinel node biopsy                                      Left total mastectomy with left axillary sentinel node biopsy  Surgeon:                      M. , M.D., FACS  Assistant:                      Sheila Bowman, RNFA  Operative Indications:   . This 80-year-old Caucasian female was referred back to me by Dr. Gudena for definitive surgery for her bilateral breast cancer.   She did not tolerate neoadjuvant chemotherapy and that has been suspended indefinitely.   Recall that she had 2 cancers in the right breast biopsy-proven. One of them is a triple negative breast cancer and the other one is hormone receptor positive. These were in the lower outer breast. The left breast also has an invasive cancer in the upper outer quadrant, receptor positive and HER-2 negative.   I placed a Port-A-Cath on April 20. She underwent a few rounds of neoadjuvant chemotherapy but then had a nervous breakdown, superimposed on her chronic anxiety disorder. She was hospitalized and seen by psychiatry. She had a fall and a minor subarachnoid hemorrhage. She was at [facility] getting rehabilitation and physical therapy, but is now back at her home.She ambulates with a cane and is a bit deconditioned.   . She had a follow-up MRI of the breast on June 8 after her partial chemotherapy  treatment. In the right breast the central and inferior medial mass is partially downstaged from 6 cm to 4.5 cm. In the left breast centrally the mass is down from 5 cm to 3.4 cm. We have had a very long talk about numerous issues . I basically told her that her treatment plan is still with curative intent since there is no sign of distant metastatic disease. I told her that the best surgical option for her was bilateral total mastectomy, bilateral sentinel node biopsy. She is not interested in reconstruction, and in fact   is not a very good candidate. We did discuss that.   She knows that she will probably need to go to assisted living or skilled nursing after the surgery. All of her questions are answered. She understands all of these issues. She agrees with this plan.   Dr. Kadakia is her primary care physician. He saw her in the office on April 17, 2015 and stated that she may undergo the breast surgery. He will follow-up with her in 1 month.  Operative Findings:       The Port-A-Cath was removed without difficulty.  There was no grossly palpable or visual cancer within the mastectomy specimen.  I found 2 sentinel lymph nodes on the left.  I found 1 sentinel lymph node and one non-sentinel lymph node on the right.  Procedure in Detail:          The patient underwent bilateral pectoral block by Dr. Manny, anesthesiologist.  Both breast were injected with radionuclide by the nuclear medicine technician.  The patient was taken the operating room.  A Foley catheter was inserted.  Surgical timeout was performed.  Intravenous antibiotic given.    Following alcohol prep I injected 5 mL of blue dye into the right breast and 5 mL of blue dye left breast, subareolar areas.  This was methylene blue mixed with saline, and both breasts were massaged for a few minutes.  The neck, breast, chest wall and axilla were then prepped and draped in a sterile fashion.  I used a marking pen to plan transverse  elliptical incisions, mirror image.    I removed the port first.  I made a transverse incision in the right infraclavicular area through the scar.  I dissected down to the capsule of the port.  I lifted the port up and cut and removed all 3 proline sutures.  The port and catheter were removed without any difficulty.  There was no bleeding.  The subcutaneous tissue was closed with interrupted 3-0 Vicryls and the skin closed with a running subcuticular 4-0 Monocryl and Dermabond.     I performed the left mastectomy first, aching the transverse elliptical incision with a knife.  Skin flaps were raised superiorly to the infraclavicular area, medially to the parasternal area, inferiorly to the anterior rectus sheath and laterally to the latissimus dorsi muscle.  The breast was dissected off of the pectoralis major and minor muscles.  The lateral skin margin was marked with silk suture.  The breast specimen was removed.  Using the neoprobe I dissected up in the left axilla and found 2 very hot and very blue lymph nodes.  These were removed and sent to the lab separately.  There was no other radioactivity.  This wound was packed off.    I then  performed the right mastectomy in identical fashion with a mirror image incision.  The right breast specimen was marked to designate the lateral skin margin.  After removing the breast I used the neoprobe and I found 1 sentinel lymph node and one non-sentinel lymph node in the right axilla.  There was no other radioactivity.    Both mastectomy incisions were copiously irrigated with saline.  Hemostasis was excellent and was achieved with electrocautery and a few metal clips.  On each side I placed two 19 French Blake drains, one up into the axilla and one across the skin flaps.  These were brought out through separate stab incisions inferolaterally, and sutured to the skin with nylon sutures.  Both mastectomy   incisions were closed in 2 layers, subcutaneous and dermis with  interrupted 3-0 Vicryls and the skin was closed with a running subcuticular suture of 4-0 Monocryl and Dermabond.  Dry bandages and a breast binder were placed.  The patient tolerated the procedure well and was taken to PACU in stable condition.  EBL 125  mL or less.  Counts correct.  Complications none.              M. , M.D., FACS General and Minimally Invasive Surgery Breast and Colorectal Surgery  05/04/2015 12:58 PM  

## 2015-05-04 NOTE — Interval H&P Note (Signed)
History and Physical Interval Note:  05/04/2015 9:47 AM  Grace Paul  has presented today for surgery, with the diagnosis of bilateral breast cancer  The various methods of treatment have been discussed with the patient and family. After consideration of risks, benefits and other options for treatment, the patient has consented to  Procedure(s): BILATERAL MASTECTOMY WITH BILATERAL  SENTINEL LYMPH NODE BIOPSY (Bilateral) REMOVAL PORT-A-CATH (N/A) as a surgical intervention .  The patient's history has been reviewed, patient examined, no change in status, stable for surgery.  I have reviewed the patient's chart and labs.  Questions were answered to the patient's satisfaction.     Adin Hector

## 2015-05-04 NOTE — Progress Notes (Signed)
Patient refused to take Abilify, pharmacist made aware as it's listed as one of her allergies.

## 2015-05-04 NOTE — Progress Notes (Signed)
CM consult for home health needs.  PT/OT consults pending.  Case management will continue to follow for discharge needs; please enter orders for home health/DME as recommended.  We will be happy to arrange.    Reinaldo Raddle, RN, BSN  Trauma/Neuro ICU Case Manager [phone]

## 2015-05-05 DIAGNOSIS — C50511 Malignant neoplasm of lower-outer quadrant of right female breast: Secondary | ICD-10-CM | POA: Diagnosis not present

## 2015-05-05 MED ORDER — PRO-STAT SUGAR FREE PO LIQD
30.0000 mL | Freq: Every day | ORAL | Status: DC
  Administered 2015-05-05 – 2015-05-07 (×3): 30 mL via ORAL
  Filled 2015-05-05 (×3): qty 30

## 2015-05-05 MED ORDER — CIPROFLOXACIN HCL 500 MG PO TABS
500.0000 mg | ORAL_TABLET | Freq: Two times a day (BID) | ORAL | Status: DC
  Administered 2015-05-05 – 2015-05-08 (×7): 500 mg via ORAL
  Filled 2015-05-05 (×7): qty 1

## 2015-05-05 MED ORDER — ENSURE ENLIVE PO LIQD
237.0000 mL | Freq: Two times a day (BID) | ORAL | Status: DC
  Administered 2015-05-05 – 2015-05-07 (×5): 237 mL via ORAL

## 2015-05-05 NOTE — Evaluation (Signed)
Occupational Therapy Evaluation Patient Details Name: Grace Paul MRN: [id number] DOB: 04/17/1935 Today's Date: 05/05/2015    History of Present Illness 80 y.o. s/p BILATERAL MASTECTOMY WITH BILATERAL SENTINEL LYMPH NODE BIOPSY (Bilateral) and REMOVAL PORT-A-CATH. Pt received a Port-A-Cath on April 20. She underwent a few rounds of neoadjuvant chemotherapy but then had a nervous breakdown, superimposed on her chronic anxiety disorder. She was hospitalized and seen by psychiatry. She had a fall and a minor subarachnoid hemorrhage. She went to SNF for rehab recently.   Clinical Impression   Pt s/p above. Pt reports she was independent with ADLs, PTA. Feel pt will benefit from acute OT to increase independence and strength prior to d/c. Recommending SNF for rehab.    Follow Up Recommendations  SNF;Supervision/Assistance - 24 hour    Equipment Recommendations  Other (comment) (defer to next venue)    Recommendations for Other Services       Precautions / Restrictions Precautions Precautions: Other (comment) Precaution Comments: avoid pushing, pulling, lifting with UEs (can use to hold light items); avoid reaching behind back or raising arms above 90 degrees; limit weightbearing on arms Restrictions Other Position/Activity Restrictions: limit weightbearing on arms      Mobility Bed Mobility               General bed mobility comments: not assessed  Transfers Overall transfer level: Needs assistance   Transfers: Sit to/from Stand Sit to Stand: Min guard         General transfer comment: Min guard for safety.    Balance  Hand held assist for ambulation.                                           ADL Overall ADL's : Needs assistance/impaired     Grooming: Oral care;Wash/dry face;Set up;Supervision/safety;Sitting           Upper Body Dressing : Supervision/safety;Set up;Sitting       Toilet Transfer: Minimal assistance;Ambulation  (hand held assist; Min guard for sit to stand from chair)           Functional mobility during ADLs: Minimal assistance (hand held assist) General ADL Comments: Pt asking about toilet hygiene and OT explained that she could use toilet aide and explained what she could use for toilet aide, if pt would need it. Discussed precautions.     Vision     Perception     Praxis      Pertinent Vitals/Pain Pain Assessment: 0-10 Pain Score:  (1-3) Pain Location: chest  Pain Descriptors / Indicators: Tightness Pain Intervention(s): Monitored during session;Other (comment) (notified nurse)     Hand Dominance     Extremity/Trunk Assessment Upper Extremity Assessment Upper Extremity Assessment: Overall WFL for tasks assessed   Lower Extremity Assessment Lower Extremity Assessment: Defer to PT evaluation       Communication Communication Communication: No difficulties   Cognition Arousal/Alertness: Awake/alert Behavior During Therapy: WFL for tasks assessed/performed Overall Cognitive Status:  (unsure of baseline) Area of Impairment: Memory     Memory: Decreased short-term memory             General Comments       Exercises       Shoulder Instructions      Home Living Family/patient expects to be discharged to:: Skilled nursing facility Living Arrangements: Alone Available Help at Discharge: Available PRN/intermittently;Friend(s) Type of  Home: Apartment Home Access: Stairs to enter [employer] of Steps: 4 and 6 Entrance Stairs-Rails: Right;Left (on the 6 steps) Home Layout: Two level     Bathroom Shower/Tub:  (pt sponge bathes; per chart has tub/shower)   Bathroom Toilet: Standard     Home Equipment: Cane - single point          Prior Functioning/Environment Level of Independence: Independent with assistive device(s)        Comments: using cane at times    OT Diagnosis: Acute pain;Generalized weakness   OT Problem List: Decreased  strength;Decreased activity tolerance;Impaired balance (sitting and/or standing);Decreased knowledge of use of DME or AE;Decreased knowledge of precautions;Decreased cognition;Pain   OT Treatment/Interventions: DME and/or AE instruction;Self-care/ADL training;Patient/family education;Balance training;Therapeutic exercise;Therapeutic activities;Cognitive remediation/compensation    OT Goals(Current goals can be found in the care plan section) Acute Rehab OT Goals Patient Stated Goal: not stated OT Goal Formulation: With patient Time For Goal Achievement: 05/12/15 Potential to Achieve Goals: Good ADL Goals Pt Will Perform Lower Body Bathing: with set-up;sit to/from stand Pt Will Perform Lower Body Dressing: with set-up;sit to/from stand Pt Will Transfer to Toilet: with modified independence;ambulating;regular height toilet Pt Will Perform Toileting - Clothing Manipulation and hygiene: with modified independence;sit to/from stand  OT Frequency: Min 2X/week   Barriers to D/C:            Co-evaluation              End of Session Equipment Utilized During Treatment: Gait belt Nurse Communication: Other (comment) (pain and pt shaky)  Activity Tolerance: Other (comment) (lightheaded) Patient left: in chair;with nursing/sitter in room   Time: 1155-1214 OT Time Calculation (min): 19 min Charges:  OT General Charges $OT Visit: 1 Procedure OT Evaluation $Initial OT Evaluation Tier I: 1 Procedure G-Codes: OT G-codes **NOT FOR INPATIENT CLASS** Functional Assessment Tool Used: clinical judgment Functional Limitation: Self care Self Care Current Status (Q3335): At least 20 percent but less than 40 percent impaired, limited or restricted Self Care Goal Status (K5625): At least 1 percent but less than 20 percent impaired, limited or restricted  Benito Mccreedy OTR/L [phone] 05/05/2015, 12:42 PM

## 2015-05-05 NOTE — Progress Notes (Addendum)
Patient ID: Grace Paul, female   DOB: Jan 18, 1935, 80 y.o.   MRN: [id number]  [facility]  POD#: 1  Subjective: Patient up in chair.  Hungry.  Does not want to take Dilaudid for pain - requests Vicodin.  Objective: Vital signs in last 24 hours: Temp:  [97.7 F (36.5 C)-98.2 F (36.8 C)] 98.1 F (36.7 C) (07/16 0630) Pulse Rate:  [77-90] 82 (07/16 0630) Resp:  [10-21] 18 (07/16 0630) BP: (112-144)/(46-68) 122/54 mmHg (07/16 0630) SpO2:  [97 %-100 %] 97 % (07/16 0630) Weight:  [50.803 kg (112 lb)] 50.803 kg (112 lb) (07/15 1438) Last BM Date: 05/04/15  Intake/Output from previous day: 07/15 0701 - 07/16 0700 In: 4506.7 [P.O.:360; I.V.:4046.7; IV Piggyback:100] Out: 2365 [Urine:2140; Drains:200; Blood:25] Intake/Output this shift:    Physical Exam: HEENT - sclerae clear, mucous membranes moist Neck - soft Chest - clear bilaterally; breast binder in place; dressings dry and intact; JP drains (4) with small serosanguinous output bilaterally Cor - RRR Neuro - alert & oriented, no focal deficits  Lab Results:   Recent Labs  05/02/15 1049  WBC 9.7  HGB 13.5  HCT 40.4  PLT 252   BMET  Recent Labs  05/02/15 1049  NA 139  K 4.0  CL 105  CO2 25  GLUCOSE 104*  BUN 18  CREATININE 0.83  CALCIUM 9.5   PT/INR  Recent Labs  05/02/15 1049  LABPROT 13.0  INR 0.96   Comprehensive Metabolic Panel:    Component Value Date/Time   NA 139 05/02/2015 1049   NA 134* 03/21/2015 0442   NA 137 03/13/2015 0853   NA 138 03/06/2015 1049   K 4.0 05/02/2015 1049   K 4.0 03/21/2015 0442   K 4.2 03/13/2015 0853   K 4.0 03/06/2015 1049   CL 105 05/02/2015 1049   CL 96* 03/21/2015 0442   CO2 25 05/02/2015 1049   CO2 29 03/21/2015 0442   CO2 23 03/13/2015 0853   CO2 23 03/06/2015 1049   BUN 18 05/02/2015 1049   BUN 17 03/21/2015 0442   BUN 10.7 03/13/2015 0853   BUN 13.8 03/06/2015 1049   CREATININE 0.83 05/02/2015 1049   CREATININE  0.77 03/21/2015 0442   CREATININE 0.8 03/13/2015 0853   CREATININE 0.8 03/06/2015 1049   GLUCOSE 104* 05/02/2015 1049   GLUCOSE 100* 03/21/2015 0442   GLUCOSE 104 03/13/2015 0853   GLUCOSE 106 03/06/2015 1049   CALCIUM 9.5 05/02/2015 1049   CALCIUM 8.8* 03/21/2015 0442   CALCIUM 9.3 03/13/2015 0853   CALCIUM 9.3 03/06/2015 1049   AST 20 05/02/2015 1049   AST 18 03/13/2015 0853   AST 20 03/06/2015 1049   ALT 17 05/02/2015 1049   ALT 18 03/13/2015 0853   ALT 23 03/06/2015 1049   ALKPHOS 74 05/02/2015 1049   ALKPHOS 67 03/13/2015 0853   ALKPHOS 70 03/06/2015 1049   BILITOT 0.4 05/02/2015 1049   BILITOT 0.33 03/13/2015 0853   BILITOT 0.36 03/06/2015 1049   PROT 6.5 05/02/2015 1049   PROT 6.7 03/13/2015 0853   PROT 6.6 03/06/2015 1049   ALBUMIN 4.2 05/02/2015 1049   ALBUMIN 4.0 03/13/2015 0853   ALBUMIN 4.0 03/06/2015 1049    Studies/Results: Nm Sentinel Node Inj-no Rpt (breast)  05/04/2015   CLINICAL DATA: bilateral breast cancer   Sulfur colloid was injected intradermally by the nuclear medicine  technologist for breast cancer sentinel node localization.     Anti-infectives: Anti-infectives  Start     Dose/Rate Route Frequency Ordered Stop   05/04/15 1500  ceFAZolin (ANCEF) IVPB 2 g/50 mL premix     2 g 100 mL/hr over 30 Minutes Intravenous 3 times per day 05/04/15 1440 05/05/15 0537   05/04/15 1000  ceFAZolin (ANCEF) IVPB 2 g/50 mL premix     2 g 100 mL/hr over 30 Minutes Intravenous To ShortStay Surgical 05/03/15 1240 05/04/15 1045      Assessment & Plans: Bilateral breast cancer  Status post bilateral mastectomy  Drains to remain in place  Pain control with Vicodin  OOB to chair UTI (U/A from 05/02/2015)  Will restart Cipro at patient request  Earnstine Regal, MD, [facility] Office: [place] 05/05/2015

## 2015-05-05 NOTE — Progress Notes (Addendum)
Initial Nutrition Assessment  DOCUMENTATION CODES:  Not applicable  INTERVENTION:  Ensure Enlive po BID, each supplement provides 350 kcal and 20 grams of protein  Prostat Daily, each supplement provides 100 kcal, 15 g Pro  NUTRITION DIAGNOSIS:  Increased nutrient needs related to cancer and cancer related treatments/wound healing s/p surgery as evidenced by estimated requirements for the condition  GOAL:  Patient will meet greater than or equal to 90% of their needs  MONITOR:  Supplement acceptance, PO intake, Labs, I & O's, Skin  REASON FOR ASSESSMENT:  Malnutrition Screening Tool    ASSESSMENT:  80 year old  PMHx of GERD, anxiety, htn and most recently breast Cancer. She did not tolerate neoadjuvant chemotherapy and that has been suspended indefinitely. Now POD 1  s/p Bilateral total mastectomy.  Pt reports no major changes in appetite. Right now she has been slightly intolerant to some foods.   Pt reports that her normal weight is 117 lbs She said she weighed this in March. She believes she has lost some weight "because of her anxiety"-not due to appetite. She denies recent n/v/c/d at this time.   She mentioned how she knows she needs protein for appropriate healing and was asking for supplements. She does take an MVI at home.   Diet Order:  Diet regular Room service appropriate?: Yes; Fluid consistency:: Thin  Skin:  Pale/dry. New surgical incision to chest  Last BM:  7/15  Height:  Ht Readings from Last 1 Encounters:  05/04/15 5' (1.524 m)    Weight:  Wt Readings from Last 1 Encounters:  05/04/15 112 lb (50.803 kg)    Ideal Body Weight:  45.45 kg  Wt Readings from Last 10 Encounters:  05/04/15 112 lb (50.803 kg)  05/02/15 112 lb 1.6 oz (50.848 kg)  04/24/15 113 lb 9.6 oz (51.529 kg)  03/22/15 112 lb 8 oz (51.03 kg)  03/13/15 110 lb 6 oz (50.066 kg)  02/27/15 111 lb 4.8 oz (50.485 kg)  02/20/15 113 lb (51.256 kg)  02/15/15 112 lb 12.8 oz (51.166 kg)   02/07/15 115 lb (52.164 kg)  02/07/15 113 lb 3.2 oz (51.347 kg)  4/6: 116 lb  BMI:  Body mass index is 21.87 kg/(m^2).  Estimated Nutritional Needs:  Kcal:  1500-1650 kcals (29-32 kcal/kg) Protein:  77-87 (1.5-1.7 g/kg bw) Fluid:  1.5-1.7 liters   EDUCATION NEEDS:  No education needs identified at this time  Burtis Junes RD, LDN Nutrition Pager: [phone] 05/05/2015 3:17 PM

## 2015-05-05 NOTE — Plan of Care (Signed)
Problem: Phase I Progression Outcomes Goal: OOB as tolerated unless otherwise ordered Outcome: Progressing Cautioned against getting up by herself- she gets up anyway.

## 2015-05-05 NOTE — Evaluation (Signed)
Physical Therapy Evaluation Patient Details Name: Grace Paul MRN: [id number] DOB: 11-Nov-1934 Today's Date: 05/05/2015   History of Present Illness  80 y.o. s/p BILATERAL MASTECTOMY WITH BILATERAL SENTINEL LYMPH NODE BIOPSY (Bilateral) and REMOVAL PORT-A-CATH. Pt received a Port-A-Cath on April 20. She underwent a few rounds of neoadjuvant chemotherapy but then had a nervous breakdown, superimposed on her chronic anxiety disorder. She was hospitalized and seen by psychiatry. She had a fall and a minor subarachnoid hemorrhage. She went to SNF for rehab recently.  Clinical Impression  Patient demonstrates deficits in functional mobility as indicated below. Will benefit from continued skilled PT to address deficits and maximize function. Will see as indicated bad progress as tolerated. OF NOTE: Patient states that she does not wish to go to SNF, states that she wants to be at home and can arrange 24/7 assist. If unable to arrange assist, may need to consider ST SNF.     Follow Up Recommendations Home health PT;Supervision/Assistance - 24 hour ( if unable to arrange supervision, may need ST SNF)    Equipment Recommendations  None recommended by PT    Recommendations for Other Services       Precautions / Restrictions Precautions Precautions: Other (comment) Precaution Comments: avoid pushing, pulling, lifting with UEs (can use to hold light items); avoid reaching behind back or raising arms above 90 degrees; limit weightbearing on arms Restrictions Other Position/Activity Restrictions: limit weightbearing on arms      Mobility  Bed Mobility               General bed mobility comments: not assessed  Transfers Overall transfer level: Needs assistance Equipment used: Straight cane Transfers: Sit to/from Stand Sit to Stand: Min guard         General transfer comment: Min guard for safety.  Ambulation/Gait Ambulation/Gait assistance: Supervision;Min guard Ambulation  Distance (Feet): 210 Feet Assistive device: Straight cane Gait Pattern/deviations: Step-through pattern;Decreased stride length;Narrow base of support     General Gait Details: steady with use of cane for mobility, min guard secondary to line and drain management  Stairs            Wheelchair Mobility    Modified Rankin (Stroke Patients Only)       Balance                                             Pertinent Vitals/Pain Pain Assessment: 0-10 Pain Score: 3  Pain Location: chest (mainly left side) Pain Descriptors / Indicators: Nagging;Tightness Pain Intervention(s): Monitored during session;Repositioned    Home Living Family/patient expects to be discharged to:: Skilled nursing facility Living Arrangements: Alone Available Help at Discharge: Available PRN/intermittently;Friend(s) Type of Home: Apartment Home Access: Stairs to enter Entrance Stairs-Rails: Right;Left (on the 6 steps) Entrance Stairs-Number of Steps: 4 and 6 Home Layout: Two level Home Equipment: Cane - single point      Prior Function Level of Independence: Independent with assistive device(s)         Comments: using cane at times     Hand Dominance        Extremity/Trunk Assessment   Upper Extremity Assessment: Overall WFL for tasks assessed           Lower Extremity Assessment: Overall WFL for tasks assessed      Cervical / Trunk Assessment: Kyphotic  Communication   Communication: No difficulties  Cognition Arousal/Alertness: Awake/alert Behavior During Therapy: WFL for tasks assessed/performed Overall Cognitive Status: Within Functional Limits for tasks assessed Area of Impairment: Memory     Memory: Decreased short-term memory              General Comments      Exercises        Assessment/Plan    PT Assessment Patient needs continued PT services  PT Diagnosis Difficulty walking;Acute pain   PT Problem List Decreased  strength;Decreased activity tolerance;Decreased balance;Decreased mobility;Decreased coordination;Decreased skin integrity;Pain  PT Treatment Interventions DME instruction;Gait training;Stair training;Functional mobility training;Therapeutic activities;Therapeutic exercise;Balance training;Patient/family education   PT Goals (Current goals can be found in the Care Plan section) Acute Rehab PT Goals Patient Stated Goal: to go home PT Goal Formulation: With patient Time For Goal Achievement: 05/19/15 Potential to Achieve Goals: Good    Frequency Min 4X/week   Barriers to discharge Decreased caregiver support      Co-evaluation               End of Session   Activity Tolerance: Patient tolerated treatment well Patient left: in chair;with call bell/phone within reach Nurse Communication: Mobility status    Functional Assessment Tool Used: clincial judgement Functional Limitation: Mobility: Walking and moving around Mobility: Walking and Moving Around Current Status (R8309): At least 1 percent but less than 20 percent impaired, limited or restricted Mobility: Walking and Moving Around Goal Status [phone]): 0 percent impaired, limited or restricted    Time: 1410-1427 PT Time Calculation (min) (ACUTE ONLY): 17 min   Charges:   PT Evaluation $Initial PT Evaluation Tier I: 1 Procedure     PT G Codes:   PT G-Codes **NOT FOR INPATIENT CLASS** Functional Assessment Tool Used: clincial judgement Functional Limitation: Mobility: Walking and moving around Mobility: Walking and Moving Around Current Status (G8811): At least 1 percent but less than 20 percent impaired, limited or restricted Mobility: Walking and Moving Around Goal Status [phone]): 0 percent impaired, limited or restricted    Duncan Dull 05/05/2015, 3:06 PM Alben Deeds, [place] DPT  [phone]

## 2015-05-06 DIAGNOSIS — C50511 Malignant neoplasm of lower-outer quadrant of right female breast: Secondary | ICD-10-CM | POA: Diagnosis not present

## 2015-05-06 NOTE — Care Management Note (Addendum)
Case Management Note  Patient Details  Name: GWENNA FUSTON MRN: [id number] Date of Birth: April 11, 1935  Subjective/Objective:                  Bilateral breast cancer Status post bilateral mastectomy Drains to remain in place Pain control with Vicodin OOB to chair UTI (U/A from 05/02/2015) Cipro at patient request  Action/Plan: Discharge to home w [facility] vs SNF  Expected Discharge Date:   05/07/15               Expected Discharge Plan:   Home with home health  In-House Referral:     Discharge planning Services  CM Consult  Post Acute Care Choice:    Choice offered to:  Patient  DME Arranged:    DME Agency:     HH Arranged:    [place] Agency:     Status of Service:  In process, will continue to follow  Medicare Important Message Given:    Date Medicare IM Given:    Medicare IM give by:    Date Additional Medicare IM Given:    Additional Medicare Important Message give by:     If discussed at [place] of Stay Meetings, dates discussed:    Additional Comments:  Spoke to patient explained MD's discharge plans.  Patient states she would prefer to go home with home health.  She states that she has a packet at home with all the information regarding a home health agency and that she has already been set up.  Gave me permission to contact her friend [name] [phone] to have her bring the information to the hospital.  Spoke to [name] who is willing to do dressing changes and empty patient's jp drains if taught and procedure is simple.  States that patient had one therapy session prior to admission with a Clyde Canterbury but wasn't sure which agency she was from, will check patient's home and try to find information. Asked about the [facility] program but thought it was a home care based service, explained it was more of an Adult daycare unless it had changed.  Stated maybe possible for a HHRN to provide a safety evaluation  for patient's home which may or may not allow a CNA to go into home in addition to [place].  Will update with name of [place] agency if provided.     Jaeda, [place], RN 05/06/2015, 10:28 AM

## 2015-05-06 NOTE — Progress Notes (Addendum)
Occupational Therapy Treatment Patient Details Name: Grace Paul MRN: [id number] DOB: 17-Oct-1935 Today's Date: 05/06/2015    History of present illness 80 y.o. s/p BILATERAL MASTECTOMY WITH BILATERAL SENTINEL LYMPH NODE BIOPSY (Bilateral) and REMOVAL PORT-A-CATH. Pt received a Port-A-Cath on April 20. She underwent a few rounds of neoadjuvant chemotherapy but then had a nervous breakdown, superimposed on her chronic anxiety disorder. She was hospitalized and seen by psychiatry. She had a fall and a minor subarachnoid hemorrhage. She went to SNF for rehab recently.   OT comments  Pt progressing toward goals. Continue to recommend SNF and feel pt is not safe to d/c home unless she has 24/7 assist/supervision at home.   Follow Up Recommendations  SNF;Supervision/Assistance - 24 hour    Equipment Recommendations  Other (comment) (defer to next venue)    Recommendations for Other Services      Precautions / Restrictions Precautions Precautions: Other (comment) Precaution Comments: avoid pushing, pulling, lifting with UEs (can use to hold light items); avoid reaching behind back or raising arms above 90 degrees; limit weightbearing on arms Restrictions Other Position/Activity Restrictions: limit weightbearing on arms       Mobility Bed Mobility Overal bed mobility: Needs Assistance Bed Mobility: Supine to Sit;Sit to Supine     Supine to sit: Supervision Sit to supine: Supervision   General bed mobility comments: explained to try not to push with arms when returning to bed.  Transfers Overall transfer level: Needs assistance   Transfers: Sit to/from Stand Sit to Stand: Min guard         General transfer comment: cue to try to limit pushing up with arms.    Balance  No LOB in session. Ambulated with and without cane.                                  ADL Overall ADL's : Needs assistance/impaired     Grooming: Wash/dry  hands;Supervision/safety;Standing       Lower Body Bathing: Set up;Supervison/ safety;Sit to/from stand (standing-washed bottom)       Lower Body Dressing: Set up;Sitting/lateral leans (changed socks)   Toilet Transfer: Min guard;Ambulation;Comfort height toilet;BSC (cane for part of time)   Toileting- Water quality scientist and Hygiene: Min guard;Sit to/from stand       Functional mobility during ADLs: Min guard;Cane (with and without cane) General ADL Comments: Discussed d/c recommendation and pt seemed agreeable with this and reported she will not have 24/7 assist at home.       Vision                     Perception     Praxis      Cognition  Awake/Alert Behavior During Therapy: Impulsive Overall Cognitive Status: No family/caregiver present to determine baseline cognitive functioning Area of Impairment: Orientation;Safety/judgement Orientation Level: Disoriented to;Place          Safety/Judgement: Decreased awareness of safety          Extremity/Trunk Assessment               Exercises     Shoulder Instructions       General Comments      Pertinent Vitals/ Pain       Pain Assessment: 0-10 Pain Score:  (3-4 in IV site on left hand; 5-6 left chest area) Pain Location: left chest area and IV site on left hand Pain Descriptors / Indicators:  (  felt like a pin sticking in her on left chest area) Pain Intervention(s): Monitored during session;Other (comment) (notified nurse)  Home Living                                          Prior Functioning/Environment              Frequency Min 2X/week     Progress Toward Goals  OT Goals(current goals can now be found in the care plan section)  Progress towards OT goals: Progressing toward goals  Acute Rehab OT Goals Patient Stated Goal: not stated OT Goal Formulation: With patient Time For Goal Achievement: 05/12/15 Potential to Achieve Goals: Good ADL Goals Pt Will  Perform Lower Body Bathing: with set-up;sit to/from stand Pt Will Perform Lower Body Dressing: with set-up;sit to/from stand Pt Will Transfer to Toilet: with modified independence;ambulating;regular height toilet Pt Will Perform Toileting - Clothing Manipulation and hygiene: with modified independence;sit to/from stand  Plan Discharge plan remains appropriate    Co-evaluation                 End of Session Equipment Utilized During Treatment: Gait belt;Other (comment) (cane)   Activity Tolerance Patient tolerated treatment well   Patient Left in bed;with call bell/phone within reach;with bed alarm set   Nurse Communication Other (comment) (pain)        Time: 7564-3329 (OT left room to retrieve washcloths/soap-less than 1 minute) OT Time Calculation (min): 19 min  Charges: OT General Charges $OT Visit: 1 Procedure OT Treatments $Self Care/Home Management : 8-22 mins  Benito Mccreedy OTR/L [phone] 05/06/2015, 3:51 PM

## 2015-05-06 NOTE — Progress Notes (Signed)
PT Cancellation Note  Patient Details Name: Grace Paul MRN: [id number] DOB: 11-08-1934   Cancelled Treatment:    Reason Eval/Treat Not Completed: Pain limiting ability to participate & pt reports having diarrhea, requesting to rest for today.      [name], [place] [phone] 05/06/2015

## 2015-05-06 NOTE — Plan of Care (Signed)
Problem: Phase I Progression Outcomes Goal: Pain controlled with appropriate interventions Outcome: Completed/Met Date Met:  05/06/15 Taking Vicodin x 1 Tablet for pain. Anxiety seems to be the bigger problem.

## 2015-05-06 NOTE — Progress Notes (Signed)
Pt to return home with [facility] therapies, noted.  CSW signing off.  Please re-consult as necessary.

## 2015-05-06 NOTE — Progress Notes (Signed)
Patient ID: Grace Paul, female   DOB: [birth year], 80 y.o.   MRN: [id number]  [facility]  POD#: 2  Subjective: Patient up in chair.  Better this AM.  Complains of left sided chest wall pain.  Objective: Vital signs in last 24 hours: Temp:  [97.9 F (36.6 C)-98.8 F (37.1 C)] 98.8 F (37.1 C) (07/17 0654) Pulse Rate:  [69-77] 73 (07/17 0654) Resp:  [17-18] 17 (07/17 0654) BP: (125-157)/(49-86) 125/49 mmHg (07/17 0654) SpO2:  [97 %-100 %] 97 % (07/17 0654) Last BM Date: 05/04/15  Intake/Output from previous day: 07/16 0701 - 07/17 0700 In: -  Out: 260 [Drains:260] Intake/Output this shift:    Physical Exam: HEENT - sclerae clear, mucous membranes moist Neck - soft Chest - clear bilaterally; incisions clear and dry and intact; JP's with small serosanguinous output Cor - RRR Ext - no edema, non-tender Neuro - alert & oriented, no focal deficits  Lab Results:  No results for input(s): WBC, HGB, HCT, PLT in the last 72 hours. BMET No results for input(s): NA, K, CL, CO2, GLUCOSE, BUN, CREATININE, CALCIUM in the last 72 hours. PT/INR No results for input(s): LABPROT, INR in the last 72 hours. Comprehensive Metabolic Panel:    Component Value Date/Time   NA 139 05/02/2015 1049   NA 134* 03/21/2015 0442   NA 137 03/13/2015 0853   NA 138 03/06/2015 1049   K 4.0 05/02/2015 1049   K 4.0 03/21/2015 0442   K 4.2 03/13/2015 0853   K 4.0 03/06/2015 1049   CL 105 05/02/2015 1049   CL 96* 03/21/2015 0442   CO2 25 05/02/2015 1049   CO2 29 03/21/2015 0442   CO2 23 03/13/2015 0853   CO2 23 03/06/2015 1049   BUN 18 05/02/2015 1049   BUN 17 03/21/2015 0442   BUN 10.7 03/13/2015 0853   BUN 13.8 03/06/2015 1049   CREATININE 0.83 05/02/2015 1049   CREATININE 0.77 03/21/2015 0442   CREATININE 0.8 03/13/2015 0853   CREATININE 0.8 03/06/2015 1049   GLUCOSE 104* 05/02/2015 1049   GLUCOSE 100* 03/21/2015 0442   GLUCOSE 104 03/13/2015 0853   GLUCOSE 106 03/06/2015 1049   CALCIUM 9.5 05/02/2015 1049   CALCIUM 8.8* 03/21/2015 0442   CALCIUM 9.3 03/13/2015 0853   CALCIUM 9.3 03/06/2015 1049   AST 20 05/02/2015 1049   AST 18 03/13/2015 0853   AST 20 03/06/2015 1049   ALT 17 05/02/2015 1049   ALT 18 03/13/2015 0853   ALT 23 03/06/2015 1049   ALKPHOS 74 05/02/2015 1049   ALKPHOS 67 03/13/2015 0853   ALKPHOS 70 03/06/2015 1049   BILITOT 0.4 05/02/2015 1049   BILITOT 0.33 03/13/2015 0853   BILITOT 0.36 03/06/2015 1049   PROT 6.5 05/02/2015 1049   PROT 6.7 03/13/2015 0853   PROT 6.6 03/06/2015 1049   ALBUMIN 4.2 05/02/2015 1049   ALBUMIN 4.0 03/13/2015 0853   ALBUMIN 4.0 03/06/2015 1049    Studies/Results: Nm Sentinel Node Inj-no Rpt (breast)  05/04/2015   CLINICAL DATA: bilateral breast cancer   Sulfur colloid was injected intradermally by the nuclear medicine  technologist for breast cancer sentinel node localization.     Anti-infectives: Anti-infectives    Start     Dose/Rate Route Frequency Ordered Stop   05/05/15 0830  ciprofloxacin (CIPRO) tablet 500 mg     500 mg Oral 2 times daily 05/05/15 0814     05/04/15 1500  ceFAZolin (ANCEF) IVPB 2 g/50  mL premix     2 g 100 mL/hr over 30 Minutes Intravenous 3 times per day 05/04/15 1440 05/05/15 0537   05/04/15 1000  ceFAZolin (ANCEF) IVPB 2 g/50 mL premix     2 g 100 mL/hr over 30 Minutes Intravenous To ShortStay Surgical 05/03/15 1240 05/04/15 1045      Assessment & Plans: Bilateral breast cancer Status post bilateral mastectomy Drains to remain in place Pain control with Vicodin OOB to chair UTI (U/A from 05/02/2015) Cipro at patient request Disposition  Will ask Case Mgr to see  PT recommends home PT or Rehab  Should be ready for discharge tomorrow to either home or Rehab  Will arrange follow up with Dr. Dalbert Batman in [place] office  Earnstine Regal, MD, [facility] Office:  [place] 05/06/2015

## 2015-05-06 NOTE — Plan of Care (Signed)
Problem: Phase I Progression Outcomes Goal: Tubes/drains patent Outcome: Completed/Met Date Met:  05/06/15 Has 4 Grace Paul drains that are recharged regularly. Minimal drainage.

## 2015-05-07 ENCOUNTER — Encounter (HOSPITAL_COMMUNITY): Payer: Self-pay | Admitting: General Surgery

## 2015-05-07 DIAGNOSIS — C50511 Malignant neoplasm of lower-outer quadrant of right female breast: Secondary | ICD-10-CM | POA: Diagnosis not present

## 2015-05-07 NOTE — Progress Notes (Signed)
3 Days Post-Op  Subjective: PT doing ok some pain Participating with PT/OT   Objective: Vital signs in last 24 hours: Temp:  [98.3 F (36.8 C)-99 F (37.2 C)] 98.7 F (37.1 C) (07/18 0618) Pulse Rate:  [69-75] 69 (07/18 0618) Resp:  [16-18] 16 (07/18 0618) BP: (122-140)/(49-69) 133/64 mmHg (07/18 0618) SpO2:  [97 %-99 %] 98 % (07/18 0618) Last BM Date: 05/06/15  Intake/Output from previous day: 07/17 0701 - 07/18 0700 In: 1212 [P.O.:712; I.V.:500] Out: 90 [Drains:90] Intake/Output this shift: Total I/O In: 500 [I.V.:500] Out: 50 [Drains:50]  Incision/Wound:CDI flaps ok JP serous   Lab Results:  No results for input(s): WBC, HGB, HCT, PLT in the last 72 hours. BMET No results for input(s): NA, K, CL, CO2, GLUCOSE, BUN, CREATININE, CALCIUM in the last 72 hours. PT/INR No results for input(s): LABPROT, INR in the last 72 hours. ABG No results for input(s): PHART, HCO3 in the last 72 hours.  Invalid input(s): PCO2, PO2  Studies/Results: No results found.  Anti-infectives: Anti-infectives    Start     Dose/Rate Route Frequency Ordered Stop   05/05/15 0830  ciprofloxacin (CIPRO) tablet 500 mg     500 mg Oral 2 times daily 05/05/15 0814     05/04/15 1500  ceFAZolin (ANCEF) IVPB 2 g/50 mL premix     2 g 100 mL/hr over 30 Minutes Intravenous 3 times per day 05/04/15 1440 05/05/15 0537   05/04/15 1000  ceFAZolin (ANCEF) IVPB 2 g/50 mL premix     2 g 100 mL/hr over 30 Minutes Intravenous To ShortStay Surgical 05/03/15 1240 05/04/15 1045      Assessment/Plan: s/p Procedure(s): BILATERAL MASTECTOMY WITH BILATERAL  SENTINEL LYMPH NODE BIOPSY (Bilateral) REMOVAL PORT-A-CATH (N/A) Medically stable Refuses rehab and has no help at home Will arrange for home health and plan discharge for Tuesday home SL IV  Cont PT/OT  POOR UNDERSTANDING OF HER SITUATION      Courvoisier Hamblen A. 05/07/2015

## 2015-05-07 NOTE — Progress Notes (Signed)
Physical Therapy Treatment Patient Details Name: Grace Paul MRN: [id number] DOB: 1935/01/15 Today's Date: 05/07/2015    History of Present Illness 80 y.o. s/p BILATERAL MASTECTOMY WITH BILATERAL SENTINEL LYMPH NODE BIOPSY (Bilateral) and REMOVAL PORT-A-CATH. Pt received a Port-A-Cath on April 20. She underwent a few rounds of neoadjuvant chemotherapy but then had a nervous breakdown, superimposed on her chronic anxiety disorder. She was hospitalized and seen by psychiatry. She had a fall and a minor subarachnoid hemorrhage. She went to SNF for rehab recently.    PT Comments    Patient ambulating well. Request to ambulate extensive distance (4 times around the unit). Patient very upset about not being able to walk more and feels like she is being held captive by not being able to walk around more. States that it is upsetting to her, that walking is what helps her deal with stress and not being able to walk is making her feel worse. I spoke with nurse tech in regarding to increasing mobility with staff. Will continue to see and progress as tolerated.   Follow Up Recommendations  Home health PT;Supervision/Assistance - 24 hour ( if unable to arrange supervision, may need ST SNF)     Equipment Recommendations  None recommended by PT    Recommendations for Other Services       Precautions / Restrictions Precautions Precautions: Other (comment) Precaution Comments: avoid pushing, pulling, lifting with UEs (can use to hold light items); avoid reaching behind back or raising arms above 90 degrees; limit weightbearing on arms Restrictions Other Position/Activity Restrictions: limit weightbearing on arms    Mobility  Bed Mobility               General bed mobility comments: not assessed  Transfers Overall transfer level: Needs assistance Equipment used: Straight cane   Sit to Stand: Supervision         General transfer comment: no physical assist  required  Ambulation/Gait Ambulation/Gait assistance: Supervision Ambulation Distance (Feet): 1500 Feet Assistive device: Straight cane Gait Pattern/deviations: Step-through pattern;Decreased stride length;Narrow base of support     General Gait Details: patient steady with ambulaton, requested to ambulate extensively. Ambulated around the unit 4 times with single point cane. No assist or LOB noted.    Stairs            Wheelchair Mobility    Modified Rankin (Stroke Patients Only)       Balance Overall balance assessment: No apparent balance deficits (not formally assessed)                                  Cognition Arousal/Alertness: Awake/alert Behavior During Therapy: WFL for tasks assessed/performed Overall Cognitive Status: Within Functional Limits for tasks assessed       Memory: Decreased short-term memory              Exercises      General Comments        Pertinent Vitals/Pain Pain Assessment: 0-10    Home Living                      Prior Function            PT Goals (current goals can now be found in the care plan section) Acute Rehab PT Goals Patient Stated Goal: to go home PT Goal Formulation: With patient Time For Goal Achievement: 05/19/15 Potential to Achieve Goals: Good Progress towards  PT goals: Progressing toward goals    Frequency  Min 4X/week    PT Plan Current plan remains appropriate    Co-evaluation             End of Session   Activity Tolerance: Patient tolerated treatment well Patient left: in chair;with call bell/phone within reach     Time: 1446-1510 PT Time Calculation (min) (ACUTE ONLY): 24 min  Charges:  $Gait Training: 23-37 mins                    G CodesDuncan Dull 05/26/2015, 3:52 PM Alben Deeds, [place] DPT  [phone]

## 2015-05-07 NOTE — Care Management Note (Signed)
Case Management Note  Patient Details  Name: Grace Paul MRN: [id number] Date of Birth: July 12, 1935  Subjective/Objective:                    Action/Plan: [facility] HHPT/OT/RN/aide and SW   Expected Discharge Date:                  Expected Discharge Plan:  [place]  In-House Referral:     Discharge planning Services  CM Consult  Post Acute Care Choice:  Home Health Choice offered to:  Patient  DME Arranged:    DME Agency:     HH Arranged:  RN, PT, OT, Nurse's Aide [place] Agency:  [facility]  Status of Service:  Completed, signed off  Medicare Important Message Given:    Date Medicare IM Given:    Medicare IM give by:    Date Additional Medicare IM Given:    Additional Medicare Important Message give by:     If discussed at [place] of Stay Meetings, dates discussed:    Additional Comments:  Marilu Favre, RN 05/07/2015, 1:51 PM

## 2015-05-07 NOTE — Progress Notes (Signed)
Chaplain was responding to a nurses consult. Chaplain asked if Pt wanted a priest to visit. Pt said her disposition had changed and did not want to talk to anyone. She spoke briefly about her beliefs and her life. She then said she didn't want to carry on a long conversation.Chaplain let her know Spiritual care is available when needed.

## 2015-05-08 ENCOUNTER — Ambulatory Visit: Payer: Medicare Other

## 2015-05-08 ENCOUNTER — Other Ambulatory Visit: Payer: Medicare Other | Admitting: Hematology and Oncology

## 2015-05-08 DIAGNOSIS — C50511 Malignant neoplasm of lower-outer quadrant of right female breast: Secondary | ICD-10-CM | POA: Diagnosis not present

## 2015-05-08 MED ORDER — HYDROCODONE-ACETAMINOPHEN 5-325 MG PO TABS: 1.0000 | ORAL_TABLET | ORAL | Status: DC | PRN

## 2015-05-08 NOTE — Progress Notes (Signed)
[place] Iles to be D/C'd Home per MD order.  Discussed with the patient and all questions fully answered.  VSS, Skin clean, dry and intact without evidence of skin break down, no evidence of skin tears noted. IV catheter discontinued intact. Site without signs and symptoms of complications. Dressing and pressure applied.  An After Visit Summary was printed and given to the patient.  D/c education completed with patient/family including follow up instructions, medication list, d/c activities limitations if indicated, with other d/c instructions as indicated by MD - patient able to verbalize understanding, all questions fully answered.   Patient instructed to return to ED, call 911, or call MD for any changes in condition.   Patient escorted via [place], and D/C home via private auto.  Donn Pierini Kandance Yano 05/08/2015 12:08 PM

## 2015-05-08 NOTE — Progress Notes (Signed)
4 Days Post-Op  Subjective: DOING WELL WANTS TO GO HOME   Objective: Vital signs in last 24 hours: Temp:  [98.2 F (36.8 C)-98.5 F (36.9 C)] 98.2 F (36.8 C) (07/19 0607) Pulse Rate:  [73-75] 75 (07/19 0607) Resp:  [18-19] 18 (07/19 0607) BP: (129-143)/(59-75) 130/75 mmHg (07/19 0607) SpO2:  [95 %-99 %] 95 % (07/19 0607) Last BM Date: 05/07/15  Intake/Output from previous day: 07/18 0701 - 07/19 0700 In: 1837.5 [P.O.:1440; I.V.:377.5] Out: 800 [Urine:800] Intake/Output this shift:    Incision/Wound:CLEAN VIABLE FLAPS  jp SEROUS  Lab Results:  No results for input(s): WBC, HGB, HCT, PLT in the last 72 hours. BMET No results for input(s): NA, K, CL, CO2, GLUCOSE, BUN, CREATININE, CALCIUM in the last 72 hours. PT/INR No results for input(s): LABPROT, INR in the last 72 hours. ABG No results for input(s): PHART, HCO3 in the last 72 hours.  Invalid input(s): PCO2, PO2  Studies/Results: No results found.  Anti-infectives: Anti-infectives    Start     Dose/Rate Route Frequency Ordered Stop   05/05/15 0830  ciprofloxacin (CIPRO) tablet 500 mg     500 mg Oral 2 times daily 05/05/15 0814     05/04/15 1500  ceFAZolin (ANCEF) IVPB 2 g/50 mL premix     2 g 100 mL/hr over 30 Minutes Intravenous 3 times per day 05/04/15 1440 05/05/15 0537   05/04/15 1000  ceFAZolin (ANCEF) IVPB 2 g/50 mL premix     2 g 100 mL/hr over 30 Minutes Intravenous To ShortStay Surgical 05/03/15 1240 05/04/15 1045      Assessment/Plan: s/p Procedure(s): BILATERAL MASTECTOMY WITH BILATERAL  SENTINEL LYMPH NODE BIOPSY (Bilateral) REMOVAL PORT-A-CATH (N/A) D/C/ home   With HHN      Shavon Ashmore A. 05/08/2015

## 2015-05-08 NOTE — Discharge Summary (Signed)
Physician Discharge Summary  Patient ID: Grace Paul MRN: [id number] DOB/AGE: Jun 04, 1935 80 y.o.  Admit date: 05/04/2015 Discharge date: 05/08/2015  Admission Diagnoses: BREAST CANCER BILATERAL   Discharge Diagnoses:  Principal Problem:   Bilateral breast cancer   Discharged Condition: good  Hospital Course: PT HAD ISSUES WITH HELP AT HOME  SO HE STAY WAS PROLONGED UNTIL HHN AND HELP ARRANGED.  FLAPS  DID WELL AND HER PAIN CONTROL WAS GOOD.  DIET ADVANCED.  ONCE HHN IN PLACE THE PATIENT WAS DISCHARGED  Consults: None  Significant Diagnostic Studies: NONE  Treatments: surgery: BILATERAL MASTECTOMY  Discharge Exam: Blood pressure 130/75, pulse 75, temperature 98.2 F (36.8 C), temperature source Oral, resp. rate 18, height 5' (1.524 m), weight 50.803 kg (112 lb), SpO2 95 %. Incision/Wound:FLAPS VIABLE INTACT  JP  SEROUS BILATERALLY CV RRR PULMONARY CLEAR BS   Disposition: 03-Skilled Nursing Facility  Discharge Instructions    Diet - low sodium heart healthy    Complete by:  As directed      Increase activity slowly    Complete by:  As directed             Medication List    STOP taking these medications        lidocaine-prilocaine cream  Commonly known as:  EMLA      TAKE these medications        acetaminophen 325 MG tablet  Commonly known as:  TYLENOL  Take 650 mg by mouth every 6 (six) hours as needed for mild pain or moderate pain.     amLODipine 2.5 MG tablet  Commonly known as:  NORVASC  Take 2.5 mg by mouth every morning.     ARIPiprazole 15 MG tablet  Commonly known as:  ABILIFY  Take 0.5 tablets (7.5 mg total) by mouth daily.     aspirin 81 MG tablet  Take 81 mg by mouth daily.     clonazePAM 0.5 MG tablet  Commonly known as:  KLONOPIN  Take 0.5 tablets (0.25 mg total) by mouth 2 (two) times daily.     docusate sodium 100 MG capsule  Commonly known as:  COLACE  Take 100 mg by mouth daily as needed for mild constipation.     esomeprazole  40 MG capsule  Commonly known as:  NEXIUM  Take 40 mg by mouth daily as needed (indigestion).     HYDROcodone-acetaminophen 5-325 MG per tablet  Commonly known as:  NORCO/VICODIN  Take 1-2 tablets by mouth every 4 (four) hours as needed for moderate pain.     LORazepam 1 MG tablet  Commonly known as:  ATIVAN  TAKE 1/2 TABLET BY MOUTH  3 TIMES DAILY AS NEEDED FOR ANXIETY     meclizine 12.5 MG tablet  Commonly known as:  ANTIVERT  Take 12.5 mg by mouth 2 (two) times daily as needed for dizziness.     metoprolol succinate 25 MG 24 hr tablet  Commonly known as:  TOPROL-XL  Take 25 mg by mouth 2 (two) times daily.     neomycin-polymyxin-hydrocortisone 3.5-10000-1 otic suspension  Commonly known as:  CORTISPORIN  Place 1 drop into both ears 3 (three) times daily.     ondansetron 8 MG tablet  Commonly known as:  ZOFRAN  Take by mouth every 8 (eight) hours as needed for nausea or vomiting.     SYSTANE BALANCE 0.6 % Soln  Generic drug:  Propylene Glycol  Place 1 drop into both eyes at bedtime.  temazepam 15 MG capsule  Commonly known as:  RESTORIL  Take 15 mg by mouth at bedtime.     traZODone 50 MG tablet  Commonly known as:  DESYREL  Take 50 mg by mouth at bedtime.         Signed: Stasha Naraine A. 05/08/2015, 8:53 AM

## 2015-05-09 NOTE — Assessment & Plan Note (Signed)
Right breast 5 x 6 x 3.8 cm lesion of non-masslike and nodular enhancement: Invasive ductal carcinoma with DCIS, grade 3, ER 0%, PR 0%, HER-2 negative ratio 1.16, Ki-67 83% Right Breast LOQ: 01/30/15: DCIS involving the papillary lesion ER 51%, PR 45% Left breast 4.5x5x3.5 cm area of non-mass enhancement adjacent to complex sclerosing lesion extending 3.5 cm IDC with DCIS, ER 100%; PR 63%, Ki 67 13%; Her 2 Neg; Right: LOQ ER 51%, PR 45% Neo-adjuvant chemotherapy with Taxol and carboplatin weekly 4 started 02/20/2015 -03/13/2015 stopped due to psychiatric illness and hospitalization. Left mastectomy 05/04/15 : IDC grade 2/3; 5 cm with IgG DCIS, 0/5 lymph nodes T3 N0 stage IIB;  Right mastectomy: DCIS low to high-grade multiple foci 5.5 cm 0/2 lymph nodes Tis N0 ---------------------------------------------------------------------------------------------------------------------------------------------------------- Pathology review: I discussed the pathology report in great detail and provided her with a copy of this report. Recommendation: 1. Adjuvant radiation evaluation 2. Patient is not a candidate for chemotherapy because of her labile mental status and her inability to tolerate chemotherapy side effects from before. 3. No role of antiestrogen therapy since she had bilateral mastectomies in the DCIS was the one that was ER/PR positive. Invasive breast cancer was ER/PR negative.  Severe anxiety/depression: Under the care of psychiatrist. Return to clinic in 6 months for surveillance and follow-up

## 2015-05-10 ENCOUNTER — Telehealth: Payer: Self-pay | Admitting: *Deleted

## 2015-05-10 ENCOUNTER — Ambulatory Visit: Payer: Self-pay | Admitting: Hematology and Oncology

## 2015-05-10 NOTE — Telephone Encounter (Signed)
Spoke with patient about her appointment today. She states she didn't realize she had an appointment.  She has so many people coming in and out and other appointments.  Rescheduled and confirmed new appointment for 05/16/15 at 1030am with Dr. Lindi Adie.  Informed her I would also call Jenny Reichmann to confirm appointment.

## 2015-05-16 ENCOUNTER — Telehealth: Payer: Self-pay

## 2015-05-16 ENCOUNTER — Ambulatory Visit: Payer: Self-pay | Admitting: Hematology and Oncology

## 2015-05-16 NOTE — Telephone Encounter (Signed)
Faxed PT orders to [facility].  Sent to scan.

## 2015-05-17 ENCOUNTER — Encounter: Payer: Self-pay | Admitting: *Deleted

## 2015-05-17 NOTE — Progress Notes (Signed)
[place] Work  Clinical Social Work was referred by [employer] CSW, [place] for assistance with CNA.  Clinical Social Worker completed PCS form/referral with MD assistance and faxed in on 05/15/70 to [facility] for follow up.     Clinical Social Work interventions: Resource and referral  Loren Racer, [place] Worker [place]  [place] Phone: [phone] Fax: [phone]

## 2015-05-22 ENCOUNTER — Telehealth: Payer: Self-pay | Admitting: *Deleted

## 2015-05-22 NOTE — Telephone Encounter (Signed)
Spoke with patient to follow up.  She is doing better. She states she had a really good day yesterday.  She has all of her drains out.  She states she feels most happy when she can walk to the police station.  She feels safe. Encouraged her to just take one day at a time and she should continue to get stronger through the help with PT.  Encouraged her to call with any needs or concerns.

## 2015-05-23 ENCOUNTER — Telehealth: Payer: Self-pay

## 2015-05-23 NOTE — Telephone Encounter (Signed)
Orders for home health faxed to [facility].  Sent to scan.

## 2015-05-23 NOTE — Telephone Encounter (Signed)
Two order sets were faxed - on 7/28 an 7/29

## 2015-05-28 ENCOUNTER — Emergency Department (HOSPITAL_COMMUNITY): Payer: Medicare Other

## 2015-05-28 ENCOUNTER — Encounter: Payer: Self-pay | Admitting: Hematology and Oncology

## 2015-05-28 ENCOUNTER — Other Ambulatory Visit: Payer: Self-pay

## 2015-05-28 ENCOUNTER — Encounter: Payer: Self-pay | Admitting: *Deleted

## 2015-05-28 ENCOUNTER — Encounter: Payer: Self-pay | Admitting: General Practice

## 2015-05-28 ENCOUNTER — Ambulatory Visit (HOSPITAL_BASED_OUTPATIENT_CLINIC_OR_DEPARTMENT_OTHER): Payer: Medicare Other | Admitting: Hematology and Oncology

## 2015-05-28 ENCOUNTER — Emergency Department (HOSPITAL_COMMUNITY)
Admission: EM | Admit: 2015-05-28 | Discharge: 2015-05-29 | Payer: Medicare Other | Attending: Emergency Medicine | Admitting: Emergency Medicine

## 2015-05-28 ENCOUNTER — Encounter (HOSPITAL_COMMUNITY): Payer: Self-pay

## 2015-05-28 VITALS — BP 168/82 | HR 78 | Temp 98.1°F | Resp 18 | Ht 60.0 in | Wt 107.5 lb

## 2015-05-28 DIAGNOSIS — F1310 Sedative, hypnotic or anxiolytic abuse, uncomplicated: Secondary | ICD-10-CM | POA: Insufficient documentation

## 2015-05-28 DIAGNOSIS — I10 Essential (primary) hypertension: Secondary | ICD-10-CM | POA: Diagnosis not present

## 2015-05-28 DIAGNOSIS — F411 Generalized anxiety disorder: Secondary | ICD-10-CM | POA: Diagnosis present

## 2015-05-28 DIAGNOSIS — Z8619 Personal history of other infectious and parasitic diseases: Secondary | ICD-10-CM | POA: Insufficient documentation

## 2015-05-28 DIAGNOSIS — F1110 Opioid abuse, uncomplicated: Secondary | ICD-10-CM | POA: Insufficient documentation

## 2015-05-28 DIAGNOSIS — F410 Panic disorder [episodic paroxysmal anxiety] without agoraphobia: Secondary | ICD-10-CM

## 2015-05-28 DIAGNOSIS — F329 Major depressive disorder, single episode, unspecified: Secondary | ICD-10-CM | POA: Diagnosis not present

## 2015-05-28 DIAGNOSIS — F419 Anxiety disorder, unspecified: Secondary | ICD-10-CM

## 2015-05-28 DIAGNOSIS — Z79899 Other long term (current) drug therapy: Secondary | ICD-10-CM | POA: Insufficient documentation

## 2015-05-28 DIAGNOSIS — Z8669 Personal history of other diseases of the nervous system and sense organs: Secondary | ICD-10-CM | POA: Diagnosis not present

## 2015-05-28 DIAGNOSIS — C50412 Malignant neoplasm of upper-outer quadrant of left female breast: Secondary | ICD-10-CM | POA: Diagnosis present

## 2015-05-28 DIAGNOSIS — F322 Major depressive disorder, single episode, severe without psychotic features: Secondary | ICD-10-CM | POA: Diagnosis not present

## 2015-05-28 DIAGNOSIS — R630 Anorexia: Secondary | ICD-10-CM | POA: Diagnosis not present

## 2015-05-28 DIAGNOSIS — Z8719 Personal history of other diseases of the digestive system: Secondary | ICD-10-CM | POA: Insufficient documentation

## 2015-05-28 DIAGNOSIS — R45851 Suicidal ideations: Secondary | ICD-10-CM | POA: Diagnosis present

## 2015-05-28 DIAGNOSIS — Z853 Personal history of malignant neoplasm of breast: Secondary | ICD-10-CM | POA: Insufficient documentation

## 2015-05-28 DIAGNOSIS — D0511 Intraductal carcinoma in situ of right breast: Secondary | ICD-10-CM | POA: Diagnosis not present

## 2015-05-28 DIAGNOSIS — C50311 Malignant neoplasm of lower-inner quadrant of right female breast: Secondary | ICD-10-CM

## 2015-05-28 DIAGNOSIS — Z7982 Long term (current) use of aspirin: Secondary | ICD-10-CM | POA: Insufficient documentation

## 2015-05-28 DIAGNOSIS — Z87891 Personal history of nicotine dependence: Secondary | ICD-10-CM | POA: Diagnosis not present

## 2015-05-28 LAB — CBC
HCT: 37.1 % (ref 36.0–46.0)
HEMOGLOBIN: 12.3 g/dL (ref 12.0–15.0)
MCH: 30.3 pg (ref 26.0–34.0)
MCHC: 33.2 g/dL (ref 30.0–36.0)
MCV: 91.4 fL (ref 78.0–100.0)
PLATELETS: 245 10*3/uL (ref 150–400)
RBC: 4.06 MIL/uL (ref 3.87–5.11)
RDW: 14 % (ref 11.5–15.5)
WBC: 8.9 10*3/uL (ref 4.0–10.5)

## 2015-05-28 LAB — COMPREHENSIVE METABOLIC PANEL
ALBUMIN: 4.4 g/dL (ref 3.5–5.0)
ALT: 17 U/L (ref 14–54)
AST: 19 U/L (ref 15–41)
Alkaline Phosphatase: 71 U/L (ref 38–126)
Anion gap: 8 (ref 5–15)
BUN: 12 mg/dL (ref 6–20)
CHLORIDE: 103 mmol/L (ref 101–111)
CO2: 26 mmol/L (ref 22–32)
Calcium: 9.5 mg/dL (ref 8.9–10.3)
Creatinine, Ser: 0.9 mg/dL (ref 0.44–1.00)
GFR calc Af Amer: >60 mL/min (ref >60)
GFR calc non Af Amer: 60 mL/min — ABNORMAL LOW (ref >60)
Glucose, Bld: 111 mg/dL — ABNORMAL HIGH (ref 65–99)
POTASSIUM: 3.9 mmol/L (ref 3.5–5.1)
Sodium: 137 mmol/L (ref 135–145)
Total Bilirubin: 0.5 mg/dL (ref 0.3–1.2)
Total Protein: 7.2 g/dL (ref 6.5–8.1)

## 2015-05-28 LAB — ACETAMINOPHEN LEVEL

## 2015-05-28 LAB — RAPID URINE DRUG SCREEN, HOSP PERFORMED
AMPHETAMINES: NOT DETECTED
BENZODIAZEPINES: POSITIVE — AB
Barbiturates: NOT DETECTED
COCAINE: NOT DETECTED
Opiates: POSITIVE — AB
Tetrahydrocannabinol: NOT DETECTED

## 2015-05-28 LAB — ETHANOL: Alcohol, Ethyl (B): <5 mg/dL (ref <5)

## 2015-05-28 LAB — SALICYLATE LEVEL: Salicylate Lvl: <4 mg/dL (ref 2.8–30.0)

## 2015-05-28 MED ORDER — AMLODIPINE BESYLATE 2.5 MG PO TABS
2.5000 mg | ORAL_TABLET | Freq: Every morning | ORAL | Status: DC
  Administered 2015-05-29: 2.5 mg via ORAL
  Filled 2015-05-28: qty 1

## 2015-05-28 MED ORDER — LORAZEPAM 1 MG PO TABS
1.0000 mg | ORAL_TABLET | Freq: Three times a day (TID) | ORAL | Status: DC | PRN
  Administered 2015-05-28 – 2015-05-29 (×2): 1 mg via ORAL
  Filled 2015-05-28 (×2): qty 1

## 2015-05-28 MED ORDER — METOPROLOL SUCCINATE 12.5 MG HALF TABLET
12.5000 mg | ORAL_TABLET | Freq: Two times a day (BID) | ORAL | Status: DC
  Administered 2015-05-28 – 2015-05-29 (×2): 12.5 mg via ORAL
  Filled 2015-05-28 (×3): qty 1

## 2015-05-28 MED ORDER — ACETAMINOPHEN 325 MG PO TABS: 650.0000 mg | ORAL_TABLET | ORAL | Status: DC | PRN

## 2015-05-28 MED ORDER — ASPIRIN EC 81 MG PO TBEC
81.0000 mg | DELAYED_RELEASE_TABLET | Freq: Every day | ORAL | Status: DC
  Administered 2015-05-28 – 2015-05-29 (×2): 81 mg via ORAL
  Filled 2015-05-28 (×2): qty 1

## 2015-05-28 MED ORDER — ONDANSETRON HCL 4 MG PO TABS
4.0000 mg | ORAL_TABLET | Freq: Once | ORAL | Status: AC
  Administered 2015-05-28: 4 mg via ORAL
  Filled 2015-05-28: qty 1

## 2015-05-28 MED ORDER — LORAZEPAM 1 MG PO TABS
1.0000 mg | ORAL_TABLET | Freq: Once | ORAL | Status: AC
  Administered 2015-05-28: 1 mg via ORAL
  Filled 2015-05-28: qty 1

## 2015-05-28 NOTE — ED Notes (Signed)
Patient came from the cancer center. Patient voiced to the cancer center staff that she was suicidal. Patient is voluntary. Patient denies having a plan. Patiaent states she is very frustrated and anxious. Patient denies any auditory or visual hallucinations.

## 2015-05-28 NOTE — Progress Notes (Signed)
Spiritual Care Note  Collaborated with Iris Pert, RN, nurse navigator; Dr Lindi Adie; and [name], LCSW to support Grace Paul in responding to her stated suicidal ideation.  Pt also reported high anxiety related to self-care ADLs (management of meds, meals, appointments) and identity change/morale due to recent double mastectomy; she appeared very distressed in speech, affect, and physical shaking.  Grace Paul decided to pursue support through [facility] via [facility] ED.  RN, LCSW, and chaplain walked her and her support person Grace Paul to [facility] ED, providing report to nursing staff, as well as supportive presence as Grace Paul was admitted to triage.  [place], [place] Pager [phone] Voicemail  [phone]

## 2015-05-28 NOTE — ED Notes (Signed)
Labs attempted to be drawn without a positive result.  Main lab called to get blood samples.

## 2015-05-28 NOTE — Assessment & Plan Note (Addendum)
Right breast 5 x 6 x 3.8 cm lesion of non-masslike and nodular enhancement: Invasive ductal carcinoma with DCIS, grade 3, ER 0%, PR 0%, HER-2 negative ratio 1.16, Ki-67 83% Right Breast LOQ: 01/30/15: DCIS involving the papillary lesion ER 51%, PR 45% Left breast 4.5x5x3.5 cm area of non-mass enhancement adjacent to complex sclerosing lesion extending 3.5 cm IDC with DCIS, ER 100%; PR 63%, Ki 67 13%; Her 2 Neg; Right: LOQ ER 51%, PR 45% Neo-adjuvant chemotherapy with Taxol and carboplatin weekly 4 started 02/20/2015 -03/13/2015 stopped due to psychiatric illness and hospitalization. Left mastectomy 05/04/15 : IDC grade 2/3; 5 cm with IgG DCIS, 0/5 lymph nodes T3 N0 stage IIB;  Right mastectomy: DCIS low to high-grade multiple foci 5.5 cm 0/2 lymph nodes Tis N0 ------------------------------------------------------------------------------------------------------------------------------------------------------ Pathology review: I could not discuss the pathology report because patient is under severe distress from anxiety and panic attack. Profound emotional distress and anxiety/panic attack Plan:We will try to admit her to psychiatric services at the hospital.  Recommendation: 1. Adjuvant radiation evaluation 2. Patient is not a candidate for chemotherapy because of her labile mental status and her inability to tolerate chemotherapy side effects from before. 3. No role of antiestrogen therapy since she had bilateral mastectomies in the DCIS was the one that was ER/PR positive. Invasive breast cancer was ER/PR negative.

## 2015-05-28 NOTE — ED Notes (Signed)
AC of hospital made aware of need for sitter for SI.

## 2015-05-28 NOTE — Progress Notes (Signed)
Spoke with patient today at her follow up visit with Dr. Lindi Adie.  She is very anxious today and states she wants to kill herself but does not have a plan.  She states she needs help now and medicine to help calm her down.  She states she cannot handle going to all these appointments and people at her house. Explained to her that an admission to behavioral health could get her some help now and informed her she would probably have to go through the emergency department to get medical clearance first.  Was able to get our social worker and chaplain to talk with her and she agreed to be admitted.   Oncology Nurse Navigator Documentation  Oncology Nurse Navigator Flowsheets 04/05/2015 04/24/2015 05/28/2015  Navigator Encounter Type Treatment Other Other  Patient Visit Type - - Medonc  Treatment Phase - - Other  Pharmacist, community  Education - - Other  Interventions - - Referrals  Referrals - - Social Work;Spiritual care  Support Groups/Services - Other -  Time Spent with Patient - - 60

## 2015-05-28 NOTE — Progress Notes (Signed)
Patient Care Team: Dixie Dials, MD as PCP - General (Cardiology) [name], MD as Consulting Physician (General Surgery) Nicholas Lose, MD as Consulting Physician (Hematology and Oncology) Arloa Koh, MD as Consulting Physician (Radiation Oncology) Rockwell Germany, RN as Registered Nurse Mauro Kaufmann, RN as Registered Nurse Holley Bouche, NP as Nurse Practitioner (Nurse Practitioner)  DIAGNOSIS: Breast cancer of lower-inner quadrant of right female breast   Staging form: Breast, AJCC 7th Edition     Clinical stage from 01/24/2015: Stage IIA (T2, N0, M0) - Unsigned   SUMMARY OF ONCOLOGIC HISTORY:   Breast cancer of lower-inner quadrant of right female breast   01/10/2015 Imaging Ultrasound breast: Right breast 4:00: 2.3 cm lesion, at 4:00 6 cm from nipple to 0.4 cm hypoechoic mass, left breast 1130; 2 cm from nipple 0.4 cm mass   01/12/2015 Initial Diagnosis Right breast biopsy: Invasive ductal carcinoma with DCIS, grade 3, ER 0%, PR 0%, HER-2 negative ratio 1.16, Ki-67 83%; left breast biopsy complex sclerosing lesion   01/19/2015 Breast MRI Right breast 5 x 6 x 3.8 cm lesion of non-masslike and nodular enhancement: Left breast 4.5x5x3.5 cm area of non-mass enhancement adjacent to complex sclerosing lesion extending 3.5 sinus lateral to biopsy, no lymph nodes   01/30/2015 Initial Biopsy Left Breast Biopsy: IDC with DCIS, ER 100%; PR 63%, Ki 67 13%; Her 2 Neg; Right: LOQ ER 51%, PR 45%   02/20/2015 - 03/13/2015 Neo-Adjuvant Chemotherapy Neoadjuvant Taxol and carboplatin weekly 4, stopped early because of psychiatric illness   03/15/2015 - 03/22/2015 Hospital Admission Severe generalized anxiety disorder, severe depression, small bitemporal and left frontal subarachnoid hemorrhage from a fall in the hospital   05/04/2015 Surgery Left mastectomy: IDC grade 2/35 cm with IgG DCIS, 0/5 lymph nodes T3 N0 stage IIB; right mastectomy: DCIS low to high-grade multiple foci 5.5 cm 0/2 lymph nodes Tis N0     CHIEF COMPLIANT: Severe anxiety and panic attack and emotional distress, profound depression  INTERVAL HISTORY: ZARETH RIPPETOE is a 80 year old with above-mentioned history of bilateral breast cancer treated with mastectomy some both sides. She is here today technically to discuss the results of the mastectomy is that she is under severe stress. She is walking around the room and circles feeling very panicky and anxious and worried and in a lot of distress.  REVIEW OF SYSTEMS:   Behavioral/Psych: profound anxiety, panic attack, severe depression  All other systems were reviewed with the patient and are negative.  I have reviewed the past medical history, past surgical history, social history and family history with the patient and they are unchanged from previous note.  ALLERGIES:  is allergic to gentian violet; abilify; clarithromycin; cleocin; compazine; epinephrine; hyoscyamine sulfate; and trazodone.  MEDICATIONS:  Current Outpatient Prescriptions  Medication Sig Dispense Refill  . acetaminophen (TYLENOL) 325 MG tablet Take 650 mg by mouth every 6 (six) hours as needed for mild pain or moderate pain.    Marland Kitchen amLODipine (NORVASC) 2.5 MG tablet Take 2.5 mg by mouth every morning.    . ARIPiprazole (ABILIFY) 15 MG tablet Take 0.5 tablets (7.5 mg total) by mouth daily. 15 tablet 3  . aspirin 81 MG tablet Take 81 mg by mouth daily.    . clonazePAM (KLONOPIN) 0.5 MG tablet Take 0.5 tablets (0.25 mg total) by mouth 2 (two) times daily. (Patient not taking: Reported on 04/24/2015) 30 tablet 3  . docusate sodium (COLACE) 100 MG capsule Take 100 mg by mouth daily as needed for mild  constipation.     Marland Kitchen esomeprazole (NEXIUM) 40 MG capsule Take 40 mg by mouth daily as needed (indigestion).     Marland Kitchen HYDROcodone-acetaminophen (NORCO/VICODIN) 5-325 MG per tablet Take 1-2 tablets by mouth every 4 (four) hours as needed for moderate pain. 30 tablet 0  . LORazepam (ATIVAN) 1 MG tablet TAKE 1/2 TABLET BY MOUTH   3 TIMES DAILY AS NEEDED FOR ANXIETY  0  . meclizine (ANTIVERT) 12.5 MG tablet Take 12.5 mg by mouth 2 (two) times daily as needed for dizziness.   0  . metoprolol succinate (TOPROL-XL) 25 MG 24 hr tablet Take 25 mg by mouth 2 (two) times daily.    Marland Kitchen neomycin-polymyxin-hydrocortisone (CORTISPORIN) 3.5-10000-1 otic suspension Place 1 drop into both ears 3 (three) times daily.   0  . ondansetron (ZOFRAN) 8 MG tablet Take by mouth every 8 (eight) hours as needed for nausea or vomiting.    Marland Kitchen Propylene Glycol (SYSTANE BALANCE) 0.6 % SOLN Place 1 drop into both eyes at bedtime.    . temazepam (RESTORIL) 15 MG capsule Take 15 mg by mouth at bedtime.    . traZODone (DESYREL) 50 MG tablet Take 50 mg by mouth at bedtime.  0   No current facility-administered medications for this visit.    PHYSICAL EXAMINATION: ECOG PERFORMANCE STATUS: 1 - Symptomatic but completely ambulatory  Filed Vitals:   05/28/15 1419  BP: 168/82  Pulse: 78  Temp: 98.1 F (36.7 C)  Resp: 18   Filed Weights   05/28/15 1419  Weight: 107 lb 8 oz (48.762 kg)    GENERAL:alert, no distress and comfortable SKIN: skin color, texture, turgor are normal, no rashes or significant lesions EYES: normal, Conjunctiva are pink and non-injected, sclera clear OROPHARYNX:no exudate, no erythema and lips, buccal mucosa, and tongue normal  NECK: supple, thyroid normal size, non-tender, without nodularity LYMPH:  no palpable lymphadenopathy in the cervical, axillary or inguinal LUNGS: clear to auscultation and percussion with normal breathing effort HEART: regular rate & rhythm and no murmurs and no lower extremity edema ABDOMEN:abdomen soft, non-tender and normal bowel sounds Musculoskeletal:no cyanosis of digits and no clubbing  NEURO: alert & oriented x 3 with fluent speech, no focal motor/sensory deficits   LABORATORY DATA:  I have reviewed the data as listed   Chemistry      Component Value Date/Time   NA 139 05/02/2015 1049    NA 137 03/13/2015 0853   K 4.0 05/02/2015 1049   K 4.2 03/13/2015 0853   CL 105 05/02/2015 1049   CO2 25 05/02/2015 1049   CO2 23 03/13/2015 0853   BUN 18 05/02/2015 1049   BUN 10.7 03/13/2015 0853   CREATININE 0.83 05/02/2015 1049   CREATININE 0.8 03/13/2015 0853      Component Value Date/Time   CALCIUM 9.5 05/02/2015 1049   CALCIUM 9.3 03/13/2015 0853   ALKPHOS 74 05/02/2015 1049   ALKPHOS 67 03/13/2015 0853   AST 20 05/02/2015 1049   AST 18 03/13/2015 0853   ALT 17 05/02/2015 1049   ALT 18 03/13/2015 0853   BILITOT 0.4 05/02/2015 1049   BILITOT 0.33 03/13/2015 0853       Lab Results  Component Value Date   WBC 9.7 05/02/2015   HGB 13.5 05/02/2015   HCT 40.4 05/02/2015   MCV 92.2 05/02/2015   PLT 252 05/02/2015   NEUTROABS 7.0 05/02/2015    ASSESSMENT & PLAN:  Breast cancer of lower-inner quadrant of right female breast Right breast 5 x  6 x 3.8 cm lesion of non-masslike and nodular enhancement: Invasive ductal carcinoma with DCIS, grade 3, ER 0%, PR 0%, HER-2 negative ratio 1.16, Ki-67 83% Right Breast LOQ: 01/30/15: DCIS involving the papillary lesion ER 51%, PR 45% Left breast 4.5x5x3.5 cm area of non-mass enhancement adjacent to complex sclerosing lesion extending 3.5 cm IDC with DCIS, ER 100%; PR 63%, Ki 67 13%; Her 2 Neg; Right: LOQ ER 51%, PR 45% Neo-adjuvant chemotherapy with Taxol and carboplatin weekly 4 started 02/20/2015 -03/13/2015 stopped due to psychiatric illness and hospitalization. Left mastectomy 05/04/15 : IDC grade 2/3; 5 cm with IgG DCIS, 0/5 lymph nodes T3 N0 stage IIB;  Right mastectomy: DCIS low to high-grade multiple foci 5.5 cm 0/2 lymph nodes Tis N0 ------------------------------------------------------------------------------------------------------------------------------------------------------ Pathology review: I could not discuss the pathology report because patient is under severe distress from anxiety and panic attack. Profound  emotional distress and anxiety/panic attack Plan:We will try to admit her to psychiatric services at the hospital.  Recommendation: 1. Adjuvant radiation evaluation 2. Patient is not a candidate for chemotherapy because of her labile mental status and her inability to tolerate chemotherapy side effects from before. 3. No role of antiestrogen therapy since she had bilateral mastectomies in the DCIS was the one that was ER/PR positive. Invasive breast cancer was ER/PR negative.  Return to clinic as needed  No orders of the defined types were placed in this encounter.   The patient has a good understanding of the overall plan. she agrees with it. she will call with any problems that may develop before the next visit here.   Rulon Eisenmenger, MD

## 2015-05-28 NOTE — Progress Notes (Signed)
[place] Work  Clinical Social Work was referred by patient navigator for suicidal ideations and assessment of psychosocial needs.  Clinical Social Worker and [employer] met with patient in exam room at [facility] to offer support and assess for needs.  Patient presented very anxious and started she "felt like she was losing control".  Patient also expresses suicidal thoughts but stated she had no plan.  Patient expressed multiple feelings of loneliness, loss of control, and fear.  Patient was requesting "help" and open to interventions.  CSW, patient, patients friend Jenny Reichmann), and chaplin discussed patients options including [facility].  Patient was agreeable to going to [facility].  CSW contacted ED CSW to provided information and expedite admission process.  CSW, RN, and Mable Fill escorted patient and Jenny Reichmann to the ED.  Patient is currently being evaluated.       [name], MSW, LCSW, OSW-C Clinical Social Worker [facility] [phone]

## 2015-05-28 NOTE — Progress Notes (Signed)
CSW referred the patient to the following facilities in attempts to try and obtain inpatient treatment:  [facility] Maryanna Shape [phone] ED CSW 05/28/2015 11:26 PM

## 2015-05-28 NOTE — ED Notes (Signed)
Unable to collect labs at this time Pharmacy in room with patient.
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
## 2015-05-28 NOTE — ED Notes (Signed)
Pt has not been seen at this time by physician but cleared by [facility], charge RN to go back to TCU to be seen. Pt is calm and cooperative at this time.
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
## 2015-05-29 ENCOUNTER — Ambulatory Visit: Payer: Medicare Other | Admitting: Physical Therapy

## 2015-05-29 DIAGNOSIS — F322 Major depressive disorder, single episode, severe without psychotic features: Secondary | ICD-10-CM

## 2015-05-29 DIAGNOSIS — F411 Generalized anxiety disorder: Secondary | ICD-10-CM

## 2015-05-29 DIAGNOSIS — F419 Anxiety disorder, unspecified: Secondary | ICD-10-CM | POA: Diagnosis not present

## 2015-05-29 LAB — URINALYSIS, ROUTINE W REFLEX MICROSCOPIC
Bilirubin Urine: NEGATIVE
GLUCOSE, UA: NEGATIVE mg/dL
Hgb urine dipstick: NEGATIVE
Ketones, ur: NEGATIVE mg/dL
Nitrite: NEGATIVE
PH: 6 (ref 5.0–8.0)
PROTEIN: NEGATIVE mg/dL
Specific Gravity, Urine: 1.016 (ref 1.005–1.030)
UROBILINOGEN UA: 0.2 mg/dL (ref 0.0–1.0)

## 2015-05-29 LAB — URINE MICROSCOPIC-ADD ON

## 2015-05-29 MED ORDER — DIPHENHYDRAMINE HCL 25 MG PO CAPS
25.0000 mg | ORAL_CAPSULE | Freq: Once | ORAL | Status: AC
  Administered 2015-05-29: 25 mg via ORAL
  Filled 2015-05-29: qty 1

## 2015-05-29 MED ORDER — SERTRALINE HCL 50 MG PO TABS
50.0000 mg | ORAL_TABLET | Freq: Every day | ORAL | Status: DC
  Administered 2015-05-29: 50 mg via ORAL
  Filled 2015-05-29: qty 1

## 2015-05-29 MED ORDER — BUSPIRONE HCL 10 MG PO TABS: 10.0000 mg | ORAL_TABLET | Freq: Every day | ORAL | Status: DC

## 2015-05-29 MED ORDER — LORAZEPAM 0.5 MG PO TABS
0.5000 mg | ORAL_TABLET | Freq: Three times a day (TID) | ORAL | Status: DC | PRN
Start: 2015-05-29 — End: 2015-05-29

## 2015-05-29 MED ORDER — MECLIZINE HCL 25 MG PO TABS
12.5000 mg | ORAL_TABLET | Freq: Two times a day (BID) | ORAL | Status: DC | PRN
  Administered 2015-05-29: 12.5 mg via ORAL
  Filled 2015-05-29: qty 1

## 2015-05-29 MED ORDER — DOCUSATE SODIUM 100 MG PO CAPS: 100.0000 mg | ORAL_CAPSULE | Freq: Every day | ORAL | Status: DC | PRN
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
## 2015-05-29 NOTE — Progress Notes (Signed)
Per Pamala Hurry at [facility], pt is accepted by Dr. Launa Grill to [place] Unit bed 151-1. RN report #[phone], requests report not be called until transportation has arrived.  Pamala Hurry states that pt must arrive for admission by 11pm tonight, and if that is not possible, she would need to be transported after 6am tomorrow 05/30/15 (explains bed would be held but facility does not admit within hours 11pm-6am). Pt's admission is voluntary.   Sharren Bridge, MSW, LCSW Clinical Social Work, Disposition  05/29/2015 [phone]

## 2015-05-29 NOTE — ED Notes (Signed)
05/29/2015 Reassessment:    Writer met with patient to complete a re-assessment. Patient stating she feels "scared shit less". Patient further explains that she has fear.  She denies SI. Patient stating, "I don't want to kill myself I just want these mental health issues that I have". Patient speaks of wanting "good days and happy days" Patient denies HI. She denies AVH's. She reports not sleeping well last night. Appetite is fair. Patient speaks on her history of substance use stating, "It hasn't been 30 years of alcohol use it was only 4 yrs". Patient's last drink was January 1971.

## 2015-05-29 NOTE — ED Notes (Signed)
At the request of [facility] results of pt's EKG, UA, Chest Xray and oncology report faxed to said hospitals geriatric psych unit.

## 2015-05-29 NOTE — Progress Notes (Signed)
Followed up on inpatient psych (gero) referrals.  [facility]- per Grace Paul, had not received UA, facesheet. Faxed along with Oncology note 05/28/15.  Grace Paul- per Grace Paul could not locate referral- refax Grace Paul- refer per Grace Paul- per Grace Paul could not locate referral- refax [facility]- referral faxed 05/28/15- unable to reach intake to follow up on referral  Declined: [facility]- per Grace Paul due to medical acuity (recent cancer tx) Grace Paul- per Grace Paul due to medical acuity  Grace Paul, MSW, LCSW Clinical Social Work, Disposition  05/29/2015 [phone]

## 2015-05-29 NOTE — Progress Notes (Signed)
CSW met with pt at bedside, along with pt friend. Pt gave permission to discuss deposition with pt friend. Patient at first ambivalent to go to [facility], due to feeling that she doesn't fit in with her age group however now understanding and willing to sign self in for inpatient treatment at [facility].   Belia Heman, [place] Work  [employer] [phone]

## 2015-06-05 ENCOUNTER — Ambulatory Visit (HOSPITAL_COMMUNITY): Payer: Self-pay | Admitting: Psychiatry

## 2015-06-14 ENCOUNTER — Telehealth: Payer: Self-pay

## 2015-06-14 NOTE — Telephone Encounter (Signed)
DME order sent to [facility].  Sent to scan.

## 2015-06-21 ENCOUNTER — Telehealth: Payer: Self-pay | Admitting: Physical Therapy

## 2015-06-21 NOTE — Telephone Encounter (Signed)
Returned call to pt, to maker appt for PT, pt was distraught, she informed me that she does not have transfortation and she lost someting valuablle and she cannot find it. She did took my telephone numbers.

## 2015-06-24 ENCOUNTER — Emergency Department (HOSPITAL_COMMUNITY)
Admission: EM | Admit: 2015-06-24 | Discharge: 2015-06-24 | Disposition: A | Discharge status: Home / Self Care | Payer: Medicare Other | Attending: Emergency Medicine | Admitting: Emergency Medicine

## 2015-06-24 ENCOUNTER — Encounter (HOSPITAL_COMMUNITY): Payer: Self-pay | Admitting: *Deleted

## 2015-06-24 ENCOUNTER — Emergency Department (HOSPITAL_COMMUNITY): Payer: Medicare Other

## 2015-06-24 DIAGNOSIS — R42 Dizziness and giddiness: Secondary | ICD-10-CM | POA: Diagnosis present

## 2015-06-24 DIAGNOSIS — Z79899 Other long term (current) drug therapy: Secondary | ICD-10-CM | POA: Insufficient documentation

## 2015-06-24 DIAGNOSIS — Z87891 Personal history of nicotine dependence: Secondary | ICD-10-CM | POA: Insufficient documentation

## 2015-06-24 DIAGNOSIS — Z8619 Personal history of other infectious and parasitic diseases: Secondary | ICD-10-CM | POA: Insufficient documentation

## 2015-06-24 DIAGNOSIS — R110 Nausea: Secondary | ICD-10-CM | POA: Insufficient documentation

## 2015-06-24 DIAGNOSIS — Z8669 Personal history of other diseases of the nervous system and sense organs: Secondary | ICD-10-CM | POA: Insufficient documentation

## 2015-06-24 DIAGNOSIS — Z8719 Personal history of other diseases of the digestive system: Secondary | ICD-10-CM | POA: Insufficient documentation

## 2015-06-24 DIAGNOSIS — I10 Essential (primary) hypertension: Secondary | ICD-10-CM | POA: Insufficient documentation

## 2015-06-24 DIAGNOSIS — Z853 Personal history of malignant neoplasm of breast: Secondary | ICD-10-CM | POA: Diagnosis not present

## 2015-06-24 LAB — CBC
HCT: 41.2 % (ref 36.0–46.0)
Hemoglobin: 13.9 g/dL (ref 12.0–15.0)
MCH: 30.6 pg (ref 26.0–34.0)
MCHC: 33.7 g/dL (ref 30.0–36.0)
MCV: 90.7 fL (ref 78.0–100.0)
PLATELETS: 210 10*3/uL (ref 150–400)
RBC: 4.54 MIL/uL (ref 3.87–5.11)
RDW: 13.4 % (ref 11.5–15.5)
WBC: 8.9 10*3/uL (ref 4.0–10.5)

## 2015-06-24 LAB — URINALYSIS, ROUTINE W REFLEX MICROSCOPIC
Bilirubin Urine: NEGATIVE
Glucose, UA: NEGATIVE mg/dL
HGB URINE DIPSTICK: NEGATIVE
Ketones, ur: NEGATIVE mg/dL
Nitrite: NEGATIVE
Protein, ur: NEGATIVE mg/dL
SPECIFIC GRAVITY, URINE: 1.012 (ref 1.005–1.030)
Urobilinogen, UA: 0.2 mg/dL (ref 0.0–1.0)
pH: 6.5 (ref 5.0–8.0)

## 2015-06-24 LAB — BASIC METABOLIC PANEL
Anion gap: 7 (ref 5–15)
BUN: 10 mg/dL (ref 6–20)
CALCIUM: 9.4 mg/dL (ref 8.9–10.3)
CO2: 26 mmol/L (ref 22–32)
CREATININE: 0.75 mg/dL (ref 0.44–1.00)
Chloride: 107 mmol/L (ref 101–111)
GFR calc Af Amer: >60 mL/min (ref >60)
GLUCOSE: 107 mg/dL — AB (ref 65–99)
Potassium: 3.7 mmol/L (ref 3.5–5.1)
Sodium: 140 mmol/L (ref 135–145)

## 2015-06-24 LAB — URINE MICROSCOPIC-ADD ON

## 2015-06-24 LAB — I-STAT TROPONIN, ED: Troponin i, poc: 0 ng/mL (ref 0.00–0.08)

## 2015-06-24 MED ORDER — GADOBENATE DIMEGLUMINE 529 MG/ML IV SOLN
10.0000 mL | Freq: Once | INTRAVENOUS | Status: AC | PRN
  Administered 2015-06-24: 10 mL via INTRAVENOUS

## 2015-06-24 MED ORDER — DIAZEPAM 5 MG PO TABS
5.0000 mg | ORAL_TABLET | Freq: Once | ORAL | Status: AC
  Administered 2015-06-24: 5 mg via ORAL
  Filled 2015-06-24: qty 1

## 2015-06-24 MED ORDER — MECLIZINE HCL 25 MG PO TABS: 25.0000 mg | ORAL_TABLET | Freq: Three times a day (TID) | ORAL | Status: AC | PRN

## 2015-06-24 MED ORDER — MECLIZINE HCL 25 MG PO TABS
25.0000 mg | ORAL_TABLET | Freq: Once | ORAL | Status: AC
  Administered 2015-06-24: 25 mg via ORAL
  Filled 2015-06-24: qty 1

## 2015-06-24 NOTE — ED Notes (Signed)
Patient returned from MRI.

## 2015-06-24 NOTE — ED Notes (Addendum)
Consulted with IV team nurse and charge nurse on 3W regarding starting a peripheral IV in patient's upper extremities.  Patient declines foot stick.  Discussed with EDP [place] as well.  Consensus was that we have no choice in terms of obtaining labs and starting IV for MRI contrast if patient is declining lower extremity IV insertion.

## 2015-06-24 NOTE — ED Notes (Signed)
Per EMS - patient comes from home where she lives alone with c/o dizziness since earlier this morning around 3 am.  Patient took Meclizine to treat (hx of Meniere's), but dizziness got worse @ 1 hour PTA.  Patient has recent hx of double mastectomy for breast cancer (04/2015), but is not undergoing chemotherapy.  Patient c/o some nausea, but denies vomiting and pain.  Patient's vitals on scene:  126 palp, HR 80, RR 18, 98% on RA.  CBG 108.

## 2015-06-24 NOTE — Discharge Instructions (Signed)
Dizziness Dizziness is a common problem. It is a feeling of unsteadiness or light-headedness. You may feel like you are about to faint. Dizziness can lead to injury if you stumble or fall. A person of any age group can suffer from dizziness, but dizziness is more common in older adults. CAUSES  Dizziness can be caused by many different things, including: 1. Middle ear problems. 2. Standing for too long. 3. Infections. 4. An allergic reaction. 5. Aging. 6. An emotional response to something, such as the sight of blood. 7. Side effects of medicines. 8. Tiredness. 9. Problems with circulation or blood pressure. 10. Excessive use of alcohol or medicines, or illegal drug use. 11. Breathing too fast (hyperventilation). 12. An irregular heart rhythm (arrhythmia). 13. A low red blood cell count (anemia). 14. Pregnancy. 15. Vomiting, diarrhea, fever, or other illnesses that cause body fluid loss (dehydration). 16. Diseases or conditions such as Parkinson's disease, high blood pressure (hypertension), diabetes, and thyroid problems. 17. Exposure to extreme heat. DIAGNOSIS  Your health care provider will ask about your symptoms, perform a physical exam, and perform an electrocardiogram (ECG) to record the electrical activity of your heart. Your health care provider may also perform other heart or blood tests to determine the cause of your dizziness. These may include:  Transthoracic echocardiogram (TTE). During echocardiography, sound waves are used to evaluate how blood flows through your heart.  Transesophageal echocardiogram (TEE).  Cardiac monitoring. This allows your health care provider to monitor your heart rate and rhythm in real time.  Holter monitor. This is a portable device that records your heartbeat and can help diagnose heart arrhythmias. It allows your health care provider to track your heart activity for several days if needed.  Stress tests by exercise or by giving medicine  that makes the heart beat faster. TREATMENT  Treatment of dizziness depends on the cause of your symptoms and can vary greatly. HOME CARE INSTRUCTIONS   Drink enough fluids to keep your urine clear or pale yellow. This is especially important in very hot weather. In older adults, it is also important in cold weather.  Take your medicine exactly as directed if your dizziness is caused by medicines. When taking blood pressure medicines, it is especially important to get up slowly.  Rise slowly from chairs and steady yourself until you feel okay.  In the morning, first sit up on the side of the bed. When you feel okay, stand slowly while holding onto something until you know your balance is fine.  Move your legs often if you need to stand in one place for a long time. Tighten and relax your muscles in your legs while standing.  Have someone stay with you for 1-2 days if dizziness continues to be a problem. Do this until you feel you are well enough to stay alone. Have the person call your health care provider if he or she notices changes in you that are concerning.  Do not drive or use heavy machinery if you feel dizzy.  Do not drink alcohol. SEEK IMMEDIATE MEDICAL CARE IF:   Your dizziness or light-headedness gets worse.  You feel nauseous or vomit.  You have problems talking, walking, or using your arms, hands, or legs.  You feel weak.  You are not thinking clearly or you have trouble forming sentences. It may take a friend or family member to notice this.  You have chest pain, abdominal pain, shortness of breath, or sweating.  Your vision changes.  You notice  any bleeding.  You have side effects from medicine that seems to be getting worse rather than better. MAKE SURE YOU:   Understand these instructions.  Will watch your condition.  Will get help right away if you are not doing well or get worse. Document Released: 04/01/2001 Document Revised: 10/11/2013 Document  Reviewed: 04/25/2011 [facility] Patient Information 2015 [place], [place]. This information is not intended to replace advice given to you by your health care provider. Make sure you discuss any questions you have with your health care provider. Epley Maneuver Self-Care WHAT IS THE EPLEY MANEUVER? The Epley maneuver is an exercise you can do to relieve symptoms of benign paroxysmal positional vertigo (BPPV). This condition is often just referred to as vertigo. BPPV is caused by the movement of tiny crystals (canaliths) inside your inner ear. The accumulation and movement of canaliths in your inner ear causes a sudden spinning sensation (vertigo) when you move your head to certain positions. Vertigo usually lasts about 30 seconds. BPPV usually occurs in just one ear. If you get vertigo when you lie on your left side, you probably have BPPV in your left ear. Your health care provider can tell you which ear is involved.  BPPV may be caused by a head injury. Many people older than 50 get BPPV for unknown reasons. If you have been diagnosed with BPPV, your health care provider may teach you how to do this maneuver. BPPV is not life threatening (benign) and usually goes away in time.  WHEN SHOULD I PERFORM THE EPLEY MANEUVER? You can do this maneuver at home whenever you have symptoms of vertigo. You may do the Epley maneuver up to 3 times a day until your symptoms of vertigo go away. HOW SHOULD I DO THE EPLEY MANEUVER? 18. Sit on the edge of a bed or table with your back straight. Your legs should be extended or hanging over the edge of the bed or table.  19. Turn your head halfway toward the affected ear.  20. Lie backward quickly with your head turned until you are lying flat on your back. You may want to position a pillow under your shoulders.  21. Hold this position for 30 seconds. You may experience an attack of vertigo. This is normal. Hold this position until the vertigo stops. 22. Then turn your head  to the opposite direction until your unaffected ear is facing the floor.  23. Hold this position for 30 seconds. You may experience an attack of vertigo. This is normal. Hold this position until the vertigo stops. 24. Now turn your whole body to the same side as your head. Hold for another 30 seconds.  25. You can then sit back up. ARE THERE RISKS TO THIS MANEUVER? In some cases, you may have other symptoms (such as changes in your vision, weakness, or numbness). If you have these symptoms, stop doing the maneuver and call your health care provider. Even if doing these maneuvers relieves your vertigo, you may still have dizziness. Dizziness is the sensation of light-headedness but without the sensation of movement. Even though the Epley maneuver may relieve your vertigo, it is possible that your symptoms will return within 5 years. WHAT SHOULD I DO AFTER THIS MANEUVER? After doing the Epley maneuver, you can return to your normal activities. Ask your doctor if there is anything you should do at home to prevent vertigo. This may include:  Sleeping with two or more pillows to keep your head elevated.  Not sleeping on  the side of your affected ear.  Getting up slowly from bed.  Avoiding sudden movements during the day.  Avoiding extreme head movement, like looking up or bending over.  Wearing a cervical collar to prevent sudden head movements. WHAT SHOULD I DO IF MY SYMPTOMS GET WORSE? Call your health care provider if your vertigo gets worse. Call your provider right way if you have other symptoms, including:   Nausea.  Vomiting.  Headache.  Weakness.  Numbness.  Vision changes. Document Released: 10/11/2013 Document Reviewed: 10/11/2013 [facility] Patient Information 2015 [place], [place]. This information is not intended to replace advice given to you by your health care provider. Make sure you discuss any questions you have with your health care provider.
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
## 2015-06-24 NOTE — ED Notes (Signed)
Patient transported to MRI 

## 2015-06-24 NOTE — ED Notes (Signed)
Unable to collect labs at this time. 

## 2015-06-26 ENCOUNTER — Telehealth: Payer: Self-pay

## 2015-06-26 NOTE — Telephone Encounter (Signed)
Call report rcvd from [place] 06/24/15.  Per instructions, pt presented to ED - see Epic.  Call report sent to scan.

## 2015-07-06 ENCOUNTER — Encounter: Payer: Self-pay | Admitting: *Deleted

## 2015-07-06 ENCOUNTER — Telehealth: Payer: Self-pay | Admitting: *Deleted

## 2015-07-06 NOTE — Telephone Encounter (Signed)
Spoke with patient today in a lengthy discussion about follow up with Dr. Lindi Adie and Dr. Valere Dross.  Was able to talk with Jenny Reichmann (her friend that brings her to appointments) as well to explain.  Confirmed appointment with Dr. Lindi Adie for 9/21 at 1115am and she will then see Dr. Valere Dross at [place].  Encouraged her to call with any needs.

## 2015-07-10 NOTE — Progress Notes (Signed)
Location of Breast Cancer: Left Breast  Histology per Pathology Report:  Diagnosis 1. Lymph node, sentinel, biopsy, left #1 - THERE IS NO EVIDENCE OF CARCINOMA IN 1 OF 1 LYMPH NODE (0/1). 2. Lymph node, sentinel, biopsy, left #2 - THERE IS NO EVIDENCE OF CARCINOMA IN 1 OF 1 LYMPH NODE (0/1). 3. Breast, simple mastectomy, left - INVASIVE DUCTAL CARCINOMA, GRADE 2/3, SPANNING 5.0 CM. - DUCTAL CARCINOMA IN SITU, INTERMEDIATE GRADE. - THE SURGICAL RESECTION MARGINS ARE NEGATIVE FOR CARCINOMA. - THERE IS NO EVIDENCE OF CARCINOMA IN 3 OF 3 LYMPH NODES (0/3). - SEE ONCOLOGY TABLE BELOW. 4. Breast, simple mastectomy, right - DUCTAL CARCINOMA IN SITU, LOW GRADE TO HIGH GRADE, MULTIPLE FOCI, THE LARGEST SPANS 5.5 CM. - THE SURGICAL RESECTION MARGINS ARE NEGATIVE FOR DUCTAL CARCINOMA. 2 of 6 FINAL for ALLENA, PIETILA ([id number]) Diagnosis(continued) - SEE ONCOLOGY TABLE BELOW. 5. Lymph node, sentinel, biopsy, right #1 - THERE IS NO EVIDENCE OF CARCINOMA IN 1 OF 1 LYMPH NODE (0/1). 6. Lymph node, biopsy, right axilla #2 - THERE IS NO EVIDENCE OF CARCINOMA IN 1 OF 1 LYMPH NODE (0/1).   01/30/15 Diagnosis Breast, right, needle core biopsy, LOQ - DUCTAL CARCINOMA IN SITU INVOLVING PAPILLARY LESION, SEE COMMENT.  01/12/15 Diagnosis Breast, left, needle core biopsy, upper - SCLEROSING LESION WITH USUAL DUCTAL HYPERPLASIA   Receptor Status: ER(100%), PR (60%), Her2-neu ()  Ms. Tocci discovered nodule in her breast  Past/Anticipated interventions by surgeon, if any: mastectomy Right and Left Breast  Past/Anticipated interventions by medical oncology, if any: Chemotherapy :  Chemotherapy Neoadjuvant Taxol and carboplatin weekly 4,        Lymphedema issues, if any:  None  Pain issues, if any: None  SAFETY ISSUES:  Prior radiation? No  Pacemaker/ICD? No  Possible current pregnancy?No  Is the patient on methotrexate?No  Current Complaints / other details:      [place],  Crista Curb, RN 07/10/2015,7:35 PM

## 2015-07-11 ENCOUNTER — Ambulatory Visit
Admission: RE | Admit: 2015-07-11 | Discharge: 2015-07-11 | Disposition: A | Discharge status: Home / Self Care | Payer: Medicare Other | Source: Ambulatory Visit | Attending: Radiation Oncology | Admitting: Radiation Oncology

## 2015-07-11 ENCOUNTER — Encounter: Payer: Self-pay | Admitting: Hematology and Oncology

## 2015-07-11 ENCOUNTER — Encounter: Payer: Self-pay | Admitting: *Deleted

## 2015-07-11 ENCOUNTER — Ambulatory Visit (HOSPITAL_BASED_OUTPATIENT_CLINIC_OR_DEPARTMENT_OTHER): Payer: Medicare Other | Admitting: Hematology and Oncology

## 2015-07-11 ENCOUNTER — Telehealth: Payer: Self-pay | Admitting: Hematology and Oncology

## 2015-07-11 ENCOUNTER — Encounter: Payer: Self-pay | Admitting: Radiation Oncology

## 2015-07-11 VITALS — BP 136/75 | HR 60 | Temp 98.1°F | Ht 60.0 in | Wt 109.0 lb

## 2015-07-11 VITALS — BP 152/78 | HR 69 | Temp 98.2°F | Resp 18 | Ht 60.0 in | Wt 108.2 lb

## 2015-07-11 DIAGNOSIS — C50912 Malignant neoplasm of unspecified site of left female breast: Secondary | ICD-10-CM | POA: Diagnosis present

## 2015-07-11 DIAGNOSIS — Z171 Estrogen receptor negative status [ER-]: Secondary | ICD-10-CM | POA: Diagnosis not present

## 2015-07-11 DIAGNOSIS — C50911 Malignant neoplasm of unspecified site of right female breast: Secondary | ICD-10-CM | POA: Insufficient documentation

## 2015-07-11 DIAGNOSIS — C50311 Malignant neoplasm of lower-inner quadrant of right female breast: Secondary | ICD-10-CM | POA: Diagnosis present

## 2015-07-11 DIAGNOSIS — Z170 Estrogen receptor positive status [ER+]: Secondary | ICD-10-CM | POA: Insufficient documentation

## 2015-07-11 DIAGNOSIS — C50112 Malignant neoplasm of central portion of left female breast: Secondary | ICD-10-CM | POA: Insufficient documentation

## 2015-07-11 DIAGNOSIS — F322 Major depressive disorder, single episode, severe without psychotic features: Secondary | ICD-10-CM

## 2015-07-11 DIAGNOSIS — Z9013 Acquired absence of bilateral breasts and nipples: Secondary | ICD-10-CM | POA: Insufficient documentation

## 2015-07-11 DIAGNOSIS — S06369D Traumatic hemorrhage of cerebrum, unspecified, with loss of consciousness of unspecified duration, subsequent encounter: Secondary | ICD-10-CM

## 2015-07-11 DIAGNOSIS — F419 Anxiety disorder, unspecified: Secondary | ICD-10-CM

## 2015-07-11 NOTE — Assessment & Plan Note (Signed)
Right breast 5 x 6 x 3.8 cm lesion of non-masslike and nodular enhancement: Invasive ductal carcinoma with DCIS, grade 3, ER 0%, PR 0%, HER-2 negative ratio 1.16, Ki-67 83% Right Breast LOQ: 01/30/15: DCIS involving the papillary lesion ER 51%, PR 45% Left breast 4.5x5x3.5 cm area of non-mass enhancement adjacent to complex sclerosing lesion extending 3.5 cm IDC with DCIS, ER 100%; PR 63%, Ki 67 13%; Her 2 Neg; Right: LOQ ER 51%, PR 45% Neo-adjuvant chemotherapy with Taxol and carboplatin weekly 4 started 02/20/2015 -03/13/2015 stopped due to psychiatric illness and hospitalization. Left mastectomy 05/04/15 : IDC grade 2/3; 5 cm with IgG DCIS, 0/5 lymph nodes T3 N0 stage IIB;  Right mastectomy: DCIS low to high-grade multiple foci 5.5 cm 0/2 lymph nodes Tis N0 ------------------------------------------------------------------------------------------------------------------------------------------------------ Recommendation: 1. Adjuvant radiation evaluation 2. Patient is not a candidate for chemotherapy because of her labile mental status and her inability to tolerate chemotherapy side effects from before. 3. No role of antiestrogen therapy since she had bilateral mastectomies in the DCIS was the one that was ER/PR positive. Invasive breast cancer was ER/PR negative.  Major depression, anxiety disorder: Status post hospitalization and is under psychiatric care. Return to clinic in 6 months for follow-up and surveillance

## 2015-07-11 NOTE — Addendum Note (Signed)
Encounter addended by: Benn Moulder, RN on: 07/11/2015  7:08 PM<BR>     Documentation filed: Arn Medal VN

## 2015-07-11 NOTE — Progress Notes (Signed)
Patient Care Team: Dixie Dials, MD as PCP - General (Cardiology) [name], MD as Consulting Physician (General Surgery) Nicholas Lose, MD as Consulting Physician (Hematology and Oncology) Arloa Koh, MD as Consulting Physician (Radiation Oncology) Rockwell Germany, RN as Registered Nurse Mauro Kaufmann, RN as Registered Nurse Holley Bouche, NP as Nurse Practitioner (Nurse Practitioner)  DIAGNOSIS: Breast cancer of lower-inner quadrant of right female breast   Staging form: Breast, AJCC 7th Edition     Clinical stage from 01/24/2015: Stage IIA (T2, N0, M0) - Unsigned   SUMMARY OF ONCOLOGIC HISTORY:   Breast cancer of lower-inner quadrant of right female breast   01/10/2015 Imaging Ultrasound breast: Right breast 4:00: 2.3 cm lesion, at 4:00 6 cm from nipple to 0.4 cm hypoechoic mass, left breast 1130; 2 cm from nipple 0.4 cm mass   01/12/2015 Initial Diagnosis Right breast biopsy: Invasive ductal carcinoma with DCIS, grade 3, ER 0%, PR 0%, HER-2 negative ratio 1.16, Ki-67 83%; left breast biopsy complex sclerosing lesion   01/19/2015 Breast MRI Right breast 5 x 6 x 3.8 cm lesion of non-masslike and nodular enhancement: Left breast 4.5x5x3.5 cm area of non-mass enhancement adjacent to complex sclerosing lesion extending 3.5 sinus lateral to biopsy, no lymph nodes   01/30/2015 Initial Biopsy Left Breast Biopsy: IDC with DCIS, ER 100%; PR 63%, Ki 67 13%; Her 2 Neg; Right: LOQ ER 51%, PR 45%   02/20/2015 - 03/13/2015 Neo-Adjuvant Chemotherapy Neoadjuvant Taxol and carboplatin weekly 4, stopped early because of psychiatric illness   03/15/2015 - 03/22/2015 Hospital Admission Severe generalized anxiety disorder, severe depression, small bitemporal and left frontal subarachnoid hemorrhage from a fall in the hospital   05/04/2015 Surgery Left mastectomy: IDC grade 2/35 cm with IgG DCIS, 0/5 lymph nodes T3 N0 stage IIB; right mastectomy: DCIS low to high-grade multiple foci 5.5 cm 0/2 lymph nodes Tis N0     CHIEF COMPLIANT: Follow-up of breast cancer  INTERVAL HISTORY: Grace Paul is a 80 year old with above-mentioned history of left breast invasive ductal carcinoma and right breast DCIS who had a major psychiatric meltdown during chemotherapy as well as after surgery when we met her on follow-up. She was admitted at psychiatric Hospital and received extensive therapy and today she is here for another follow-up and reports to be feeling markedly better. Unfortunately her radiation was delayed because of her psychiatric illness. She is here today accompanied by her friend. 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  REVIEW OF SYSTEMS:   Constitutional: Denies fevers, chills or abnormal weight loss Eyes: Denies blurriness of vision Ears, nose, mouth, throat, and face: Denies mucositis or sore throat Respiratory: Denies cough, dyspnea or wheezes Cardiovascular: Denies palpitation, chest discomfort or lower extremity swelling Gastrointestinal:  Denies nausea, heartburn or change in bowel habits Skin: Denies abnormal skin rashes Lymphatics: Denies new lymphadenopathy or easy bruising Neurological:Denies numbness, tingling or new weaknesses Behavioral/Psych:Major depression  Breast: Recovered very well from effects of surgery.  All other systems were reviewed with the patient and are negative.  I have reviewed the past medical history, past surgical history, social history and family history with the patient and they are unchanged from previous note.  ALLERGIES:  is allergic to chlorpromazine; gentian violet; halcion ; remeron ; trifluoperazine; abilify; clarithromycin; cleocin; compazine; epinephrine; hyoscyamine sulfate; and trazodone.  MEDICATIONS:  Current Outpatient Prescriptions  Medication Sig Dispense Refill  . amLODipine (NORVASC) 2.5 MG tablet Take 2.5 mg by mouth every  morning.    . docusate sodium (COLACE) 100 MG capsule Take 100 mg by mouth  daily as needed for mild constipation.     Marland Kitchen HYDROcodone-acetaminophen (NORCO/VICODIN) 5-325 MG per tablet Take 1-2 tablets by mouth every 4 (four) hours as needed for moderate pain. 30 tablet 0  . LORazepam (ATIVAN) 1 MG tablet TAKE 1/2 TABLET BY MOUTH  3 TIMES DAILY AS NEEDED FOR ANXIETY  0  . meclizine (ANTIVERT) 25 MG tablet Take 1 tablet (25 mg total) by mouth 3 (three) times daily as needed. 30 tablet 0  . metoprolol succinate (TOPROL-XL) 25 MG 24 hr tablet Take 12.5 mg by mouth 2 (two) times daily.     . ondansetron (ZOFRAN) 8 MG tablet Take by mouth every 8 (eight) hours as needed for nausea or vomiting.    Marland Kitchen Propylene Glycol (SYSTANE BALANCE) 0.6 % SOLN Place 1 drop into both eyes at bedtime.    . sertraline (ZOLOFT) 25 MG tablet Take 25 mg by mouth daily.     . temazepam (RESTORIL) 15 MG capsule Take 15 mg by mouth at bedtime.     No current facility-administered medications for this visit.    PHYSICAL EXAMINATION: ECOG PERFORMANCE STATUS: 1 - Symptomatic but completely ambulatory  Filed Vitals:   07/11/15 1115  BP: 152/78  Pulse: 69  Temp: 98.2 F (36.8 C)  Resp: 18   Filed Weights   07/11/15 1115  Weight: 108 lb 3.2 oz (49.079 kg)    GENERAL:alert, no distress and comfortable SKIN: skin color, texture, turgor are normal, no rashes or significant lesions EYES: normal, Conjunctiva are pink and non-injected, sclera clear OROPHARYNX:no exudate, no erythema and lips, buccal mucosa, and tongue normal  NECK: supple, thyroid normal size, non-tender, without nodularity LYMPH:  no palpable lymphadenopathy in the cervical, axillary or inguinal LUNGS: clear to auscultation and percussion with normal breathing effort HEART: regular rate & rhythm and no murmurs and no lower extremity edema ABDOMEN:abdomen soft, non-tender and normal bowel sounds Musculoskeletal:no cyanosis of digits and no clubbing  NEURO: alert & oriented x 3 with fluent speech, no focal motor/sensory  deficits   LABORATORY DATA:  I have reviewed the data as listed   Chemistry      Component Value Date/Time   NA 140 06/24/2015 1247   NA 137 03/13/2015 0853   K 3.7 06/24/2015 1247   K 4.2 03/13/2015 0853   CL 107 06/24/2015 1247   CO2 26 06/24/2015 1247   CO2 23 03/13/2015 0853   BUN 10 06/24/2015 1247   BUN 10.7 03/13/2015 0853   CREATININE 0.75 06/24/2015 1247   CREATININE 0.8 03/13/2015 0853      Component Value Date/Time   CALCIUM 9.4 06/24/2015 1247   CALCIUM 9.3 03/13/2015 0853   ALKPHOS 71 05/28/2015 1613   ALKPHOS 67 03/13/2015 0853   AST 19 05/28/2015 1613   AST 18 03/13/2015 0853   ALT 17 05/28/2015 1613   ALT 18 03/13/2015 0853   BILITOT 0.5 05/28/2015 1613   BILITOT 0.33 03/13/2015 0853       Lab Results  Component Value Date   WBC 8.9 06/24/2015   HGB 13.9 06/24/2015   HCT 41.2 06/24/2015   MCV 90.7 06/24/2015   PLT 210 06/24/2015   NEUTROABS 7.0 05/02/2015   ASSESSMENT & PLAN:  Breast cancer of lower-inner quadrant of right female breast Right breast 5 x 6 x 3.8 cm lesion of non-masslike and nodular enhancement: Invasive ductal carcinoma with DCIS, grade 3,  ER 0%, PR 0%, HER-2 negative ratio 1.16, Ki-67 83% Right Breast LOQ: 01/30/15: DCIS involving the papillary lesion ER 51%, PR 45% Left breast 4.5x5x3.5 cm area of non-mass enhancement adjacent to complex sclerosing lesion extending 3.5 cm IDC with DCIS, ER 100%; PR 63%, Ki 67 13%; Her 2 Neg; Right: LOQ ER 51%, PR 45% Neo-adjuvant chemotherapy with Taxol and carboplatin weekly 4 started 02/20/2015 -03/13/2015 stopped due to psychiatric illness and hospitalization. Left mastectomy 05/04/15 : IDC grade 2/3; 5 cm with IgG DCIS, 0/5 lymph nodes T3 N0 stage IIB;  Right mastectomy: DCIS low to high-grade multiple foci 5.5 cm 0/2 lymph nodes Tis  N0 ------------------------------------------------------------------------------------------------------------------------------------------------------ Recommendation: 1. Adjuvant radiation evaluation 2. Patient is not a candidate for chemotherapy because of her labile mental status and her inability to tolerate chemotherapy side effects from before. 3. No role of antiestrogen therapy since she had bilateral mastectomies in the DCIS was the one that was ER/PR positive. Invasive breast cancer was ER/PR negative.  Major depression, anxiety disorder: Status post hospitalization and is under psychiatric care. Return to clinic in 8 months for follow-up and surveillance  Intracerebral hemorrhage: While she was in hospital. No symptoms related to that. She had multiple questions of whether or not she has chemotherapy brain. I discussed with her that she does not have chemo brain.  No orders of the defined types were placed in this encounter.   The patient has a good understanding of the overall plan. she agrees with it. she will call with any problems that may develop before the next visit here.   Rulon Eisenmenger, MD

## 2015-07-11 NOTE — Telephone Encounter (Signed)
Appointments made and avs printed for patient °

## 2015-07-11 NOTE — Progress Notes (Signed)
CC: Dr. [name], Dr. Nicholas Lose, Dr. Dixie Dials  Follow-up note:  Diagnosis:   Right breast:  Clinical stage IIA (T2 N0 M0) invasive ductal/DCIS  ((pathologic stage 0 Tis N0 DCIS)) Left breast:  Clinical stage IIB (T3 N0 M0) invasive ductal/DCIS,  ((pathologic stage IIA (ypT3 ypN0 M0))  The patient returns today for discussion of possible post mastectomy radiation therapy in the management of her pathologic stage T3 N0 triple negative carcinoma of the left breast.  I first saw the patient at the multidisciplinary breast clinic on 01/24/2015. While lying down she noted a breast mass along the lower inner quadrant of the right breast. She underwent mammography on 01/10/2015 at the [facility] where she was found to have a 2 cm irregular mass with associated pleomorphic calcifications with calcifications extending approximately 4.5 cm anterior, medial, and superior to the suspicious mass. Additionally, there was a lobular mass measuring 0.5 cm along the inferior aspect of the right breast and felt to be dense tissue within the upper-outer quadrant of the left breast. On ultrasound she was felt to have a 2.3 x 1.3 x 1.4 cm mass within the right breast at 4:00, 4 cm from the nipple. Additionally, there was a 0.4 cm mass at 4:00 within the right breast 2 cm from the nipple. Within the left breast there was a 0.4 cm mass at 11:30, 2 cm from the nipple A biopsy of the lower inner quadrant of the right breast was diagnostic for invasive ductal carcinoma/DCIS. This was triple negative. Ki-67 was 83%. Biopsy of the upper left breast shows sclerosing ductal hyperplasia. Breast MR on 01/18/2015 showed a biopsy-proven malignancy within the central and inferior medial right breast. Also seen was the biopsy-proven sclerosing lesion within the medial central left breast with more suspicious enhancing nodular components extending up to 3.5 cm lateral and superior to the biopsy clip, worrisome for  malignancy. There was no evidence for abnormal appearing lymph nodes.  MRI guided biopsy of the left breast on 01/30/2015 showed a 5 cm area of nodular non-mass like enhancement in the central left breast.  This was diagnostic for invasive ductal carcinoma along with DCIS.  The invasive disease (left breast) was ER positive at 100%, PR +63% with a proliferation marker of 13%.  HER-2/neu was negative.  Biopsy of the right breast revealed DCIS.  She went on to receive new adjuvant Taxol/carboplatin weekly 4, but stopped early because of psychiatric illness.  On 05/04/2015 she underwent bilateral mastectomies with sentinel lymph node biopsies.  On the left, a total of 5 lymph nodes were free of metastatic disease.  She was found to have a 5.0 cm invasive ductal carcinoma with the closest margin being 1.5 cm.  There was extensive DCIS/intraductal carcinoma.  On the right 2 lymph nodes were free of metastatic disease.  She is found to have low to high-grade DCIS involving multiple foci the largest area spanning 5.5 cm is.  Surgical margins were also negative, closest margin is 1.5 cm.  She is somewhat anxious but is otherwise doing well today.  Physical examination: Alert and oriented. Filed Vitals:   07/11/15 1317  BP: 136/75  Pulse: 60  Temp: 98.1 F (36.7 C)   Head and neck examination: She wears a scarf.  Nodes: There is no palpable cervical, supraclavicular, or axillary lymphadenopathy.  Chest: Bilateral mastectomy wounds are healing well.  No visible or palpable evidence for recurrent disease.  Extremities: Without edema.  Impression:  Right breast:  Clinical  stage IIA (T2 N0 M0) invasive ductal/DCIS  ((pathologic stage 0 Tis N0 DCIS)) Left breast:  Clinical stage IIB (T3 N0 M0) invasive ductal/DCIS,  ((pathologic stage IIA (ypT3 ypN0 M0))  I had a lengthy discussion with the patient and her close friend.  Indications for post mastectomy radiation therapy include nodal positivity, positive surgical  margins, or non-luminal pT3 disease.  Based on her T3 disease she has approximately a 10% risk for local regional recurrence.  I told her that adjuvant radiation therapy would not be necessary, but I would feel more comfortable if she would take an anti-estrogen pill to help with local regional control, and survival.  If she does not take an anti-estrogen pill, or if Dr. Lindi Adie does not feel that this would be well tolerated, then we could consider left chest wall radiotherapy to simply reduce her risk for a chest wall recurrence.  I would be in favor of no radiation therapy and having her take an anti-estrogen pill.  We discussed the potential acute and late toxicities of radiation therapy.  I told her I would share my thoughts with Dr. Lindi Adie.  Plan: As above.  45 minutes was spent face-to-face with the patient, primarily counseling patient.

## 2015-07-16 ENCOUNTER — Telehealth: Payer: Self-pay | Admitting: Hematology and Oncology

## 2015-07-16 ENCOUNTER — Other Ambulatory Visit: Payer: Self-pay | Admitting: *Deleted

## 2015-07-16 NOTE — Telephone Encounter (Signed)
Called patient and she is aware of her  10/13 appopintment

## 2015-07-17 ENCOUNTER — Encounter: Payer: Self-pay | Admitting: *Deleted

## 2015-07-17 NOTE — Progress Notes (Signed)
   [facility] Psychosocial Distress Screening Clinical Social Work  Clinical Social Work was referred by distress screening protocol.  The patient scored a 6 on the Psychosocial Distress Thermometer which indicates moderate distress. Clinical Social Worker met with patient and patients friend at [facility] to assess for distress and other psychosocial needs.  Patient stated she was doing "better", but had some concerns regarding transportation.  CSW and patient discussed transportation options including [facility].  Once patient has appointments scheduled CSW will contact [facility].  Patient also has [employer] to use for transportation as needed.  CSW encouraged patient to call with any questions or concerns.       ONCBCN DISTRESS SCREENING 07/11/2015  Screening Type Initial Screening  Distress experienced in past week (1-10) 6  Practical problem type   Emotional problem type Nervousness/Anxiety;Depression;Adjusting to illness  Physical Problem type   Physician notified of physical symptoms   Referral to clinical psychology   Referral to clinical social work   Referral to dietition   Referral to financial advocate   Referral to support programs   Referral to palliative care   Other Already has psychologist   [name], MSW, LCSW, OSW-C Clinical Social Worker [place] [phone]

## 2015-08-02 ENCOUNTER — Encounter: Payer: Self-pay | Admitting: Hematology and Oncology

## 2015-08-02 ENCOUNTER — Ambulatory Visit (HOSPITAL_BASED_OUTPATIENT_CLINIC_OR_DEPARTMENT_OTHER): Payer: Medicare Other | Admitting: Hematology and Oncology

## 2015-08-02 VITALS — BP 145/77 | HR 70 | Temp 98.1°F | Resp 18 | Ht 60.0 in | Wt 109.8 lb

## 2015-08-02 DIAGNOSIS — C50111 Malignant neoplasm of central portion of right female breast: Secondary | ICD-10-CM

## 2015-08-02 DIAGNOSIS — C50311 Malignant neoplasm of lower-inner quadrant of right female breast: Secondary | ICD-10-CM | POA: Diagnosis present

## 2015-08-02 DIAGNOSIS — F322 Major depressive disorder, single episode, severe without psychotic features: Secondary | ICD-10-CM | POA: Diagnosis not present

## 2015-08-02 DIAGNOSIS — F411 Generalized anxiety disorder: Secondary | ICD-10-CM | POA: Diagnosis not present

## 2015-08-02 DIAGNOSIS — Z170 Estrogen receptor positive status [ER+]: Secondary | ICD-10-CM

## 2015-08-02 DIAGNOSIS — C50112 Malignant neoplasm of central portion of left female breast: Secondary | ICD-10-CM

## 2015-08-02 MED ORDER — ANASTROZOLE 1 MG PO TABS: 1.0000 mg | ORAL_TABLET | Freq: Every day | ORAL | Status: DC

## 2015-08-02 NOTE — Progress Notes (Signed)
Patient Care Team: Dixie Dials, MD as PCP - General (Cardiology) [name], MD as Consulting Physician (General Surgery) Nicholas Lose, MD as Consulting Physician (Hematology and Oncology) Arloa Koh, MD as Consulting Physician (Radiation Oncology) Rockwell Germany, RN as Registered Nurse Mauro Kaufmann, RN as Registered Nurse Holley Bouche, NP as Nurse Practitioner (Nurse Practitioner)  DIAGNOSIS: Breast cancer of lower-inner quadrant of right female breast [facility])   Staging form: Breast, AJCC 7th Edition     Clinical stage from 01/24/2015: Stage IIA (T2, N0, M0) - Unsigned   SUMMARY OF ONCOLOGIC HISTORY:   Breast cancer of lower-inner quadrant of right female breast ([place])   01/10/2015 Imaging Ultrasound breast: Right breast 4:00: 2.3 cm lesion, at 4:00 6 cm from nipple to 0.4 cm hypoechoic mass, left breast 1130; 2 cm from nipple 0.4 cm mass   01/12/2015 Initial Diagnosis Right breast biopsy: Invasive ductal carcinoma with DCIS, grade 3, ER 0%, PR 0%, HER-2 negative ratio 1.16, Ki-67 83%; left breast biopsy complex sclerosing lesion   01/19/2015 Breast MRI Right breast 5 x 6 x 3.8 cm lesion of non-masslike and nodular enhancement: Left breast 4.5x5x3.5 cm area of non-mass enhancement adjacent to complex sclerosing lesion extending 3.5 sinus lateral to biopsy, no lymph nodes   01/30/2015 Initial Biopsy Left Breast Biopsy: IDC with DCIS, ER 100%; PR 63%, Ki 67 13%; Her 2 Neg; Right: LOQ ER 51%, PR 45%   02/20/2015 - 03/13/2015 Neo-Adjuvant Chemotherapy Neoadjuvant Taxol and carboplatin weekly 4, stopped early because of psychiatric illness   03/15/2015 - 03/22/2015 Hospital Admission Severe generalized anxiety disorder, severe depression, small bitemporal and left frontal subarachnoid hemorrhage from a fall in the hospital   05/04/2015 Surgery Left mastectomy: IDC grade 2/3; 5 cm with DCIS, 0/5 lymph nodes ER/PR Pos, Her 2 Neg;T3 N0 stage IIB; right mastectomy: DCIS low to high-grade multiple foci  5.5 cm 0/2 lymph nodes Tis N0 ER/PR Neg    CHIEF COMPLIANT: follow-up to discuss adjuvant therapy  INTERVAL HISTORY: Grace Paul is a 80 year old with above-mentioned history of right-sided breast cancer who could not tolerate neoadjuvant chemotherapy. She underwent bilateral mastectomies. She is here today to discuss adjuvant treatment options. She has seen radiation oncology who did not recommend adjuvant radiation. During the last visit, I miss interpreted the pathology report. I was under the impression that the invasive breast cancer was triple negative. But it comes out that the invasive ductal cancer was actually estrogen receptor positive. So she is here today to discuss adjuvant antiestrogen treatment plan. She continues to have multiple psychiatric issues. Some days are better than others.    REVIEW OF SYSTEMS:   Constitutional: Denies fevers, chills or abnormal weight loss Eyes: Denies blurriness of vision Ears, nose, mouth, throat, and face: Denies mucositis or sore throat Respiratory: Denies cough, dyspnea or wheezes Cardiovascular: Denies palpitation, chest discomfort or lower extremity swelling Gastrointestinal:  Denies nausea, heartburn or change in bowel habits Skin: Denies abnormal skin rashes Lymphatics: Denies new lymphadenopathy or easy bruising Neurological:Denies numbness, tingling or new weaknesses Behavioral/Psych: major depression  All other systems were reviewed with the patient and are negative.  I have reviewed the past medical history, past surgical history, social history and family history with the patient and they are unchanged from previous note.  ALLERGIES:  is allergic to chlorpromazine; gentian violet; halcion ; remeron ; trifluoperazine; abilify; clarithromycin; cleocin; compazine; epinephrine; hyoscyamine sulfate; and trazodone.  MEDICATIONS:  Current Outpatient Prescriptions  Medication Sig Dispense Refill  .  amLODipine (NORVASC) 2.5 MG tablet  Take 2.5 mg by mouth every morning.    . docusate sodium (COLACE) 100 MG capsule Take 100 mg by mouth daily as needed for mild constipation.     Marland Kitchen HYDROcodone-acetaminophen (NORCO/VICODIN) 5-325 MG per tablet Take 1-2 tablets by mouth every 4 (four) hours as needed for moderate pain. 30 tablet 0  . LORazepam (ATIVAN) 1 MG tablet TAKE 1/2 TABLET BY MOUTH  3 TIMES DAILY AS NEEDED FOR ANXIETY  0  . meclizine (ANTIVERT) 25 MG tablet Take 1 tablet (25 mg total) by mouth 3 (three) times daily as needed. 30 tablet 0  . metoprolol succinate (TOPROL-XL) 25 MG 24 hr tablet Take 12.5 mg by mouth 2 (two) times daily.     . ondansetron (ZOFRAN) 8 MG tablet Take by mouth every 8 (eight) hours as needed for nausea or vomiting.    Marland Kitchen Propylene Glycol (SYSTANE BALANCE) 0.6 % SOLN Place 1 drop into both eyes at bedtime.    . sertraline (ZOLOFT) 25 MG tablet Take 25 mg by mouth daily. Takes 12.5 mg daily    . temazepam (RESTORIL) 15 MG capsule Take 15 mg by mouth at bedtime.     No current facility-administered medications for this visit.    PHYSICAL EXAMINATION: ECOG PERFORMANCE STATUS: 1 - Symptomatic but completely ambulatory  There were no vitals filed for this visit. There were no vitals filed for this visit.  GENERAL:alert, no distress and comfortable SKIN: skin color, texture, turgor are normal, no rashes or significant lesions EYES: normal, Conjunctiva are pink and non-injected, sclera clear OROPHARYNX:no exudate, no erythema and lips, buccal mucosa, and tongue normal  NECK: supple, thyroid normal size, non-tender, without nodularity LYMPH:  no palpable lymphadenopathy in the cervical, axillary or inguinal LUNGS: clear to auscultation and percussion with normal breathing effort HEART: regular rate & rhythm and no murmurs and no lower extremity edema ABDOMEN:abdomen soft, non-tender and normal bowel sounds Musculoskeletal:no cyanosis of digits and no clubbing  NEURO: alert & oriented x 3 with fluent  speech, no focal motor/sensory deficits  LABORATORY DATA:  I have reviewed the data as listed   Chemistry      Component Value Date/Time   NA 140 06/24/2015 1247   NA 137 03/13/2015 0853   K 3.7 06/24/2015 1247   K 4.2 03/13/2015 0853   CL 107 06/24/2015 1247   CO2 26 06/24/2015 1247   CO2 23 03/13/2015 0853   BUN 10 06/24/2015 1247   BUN 10.7 03/13/2015 0853   CREATININE 0.75 06/24/2015 1247   CREATININE 0.8 03/13/2015 0853      Component Value Date/Time   CALCIUM 9.4 06/24/2015 1247   CALCIUM 9.3 03/13/2015 0853   ALKPHOS 71 05/28/2015 1613   ALKPHOS 67 03/13/2015 0853   AST 19 05/28/2015 1613   AST 18 03/13/2015 0853   ALT 17 05/28/2015 1613   ALT 18 03/13/2015 0853   BILITOT 0.5 05/28/2015 1613   BILITOT 0.33 03/13/2015 0853       Lab Results  Component Value Date   WBC 8.9 06/24/2015   HGB 13.9 06/24/2015   HCT 41.2 06/24/2015   MCV 90.7 06/24/2015   PLT 210 06/24/2015   NEUTROABS 7.0 05/02/2015   ASSESSMENT & PLAN:  Breast cancer of lower-inner quadrant of right female breast Right breast 5 x 6 x 3.8 cm lesion of non-masslike and nodular enhancement: Invasive ductal carcinoma with DCIS, grade 3, ER 100%; PR 63%,, HER-2 negative ratio 1.16, Ki-67 83%  Right Breast LOQ: 01/30/15: DCIS involving the papillary lesion ER 51%, PR 45% Left breast 4.5x5x3.5 cm area of non-mass enhancement adjacent to complex sclerosing lesion extending 3.5 cm IDC with DCIS, ER/PR Neg Ki 67 13%; Her 2 Neg; Neo-adjuvant chemotherapy with Taxol and carboplatin weekly 4 started 02/20/2015 -03/13/2015 stopped due to psychiatric illness and hospitalization. Left mastectomy 05/04/15 : IDC grade 2/3; 5 cm with IgG DCIS, 0/5 lymph nodes T3 N0 stage IIB;  Right mastectomy: DCIS low to high-grade multiple foci 5.5 cm 0/2 lymph nodes Tis  N0 ------------------------------------------------------------------------------------------------------------------------------------------------------ Recommendation: 1. Pathology: In my previous clinic note, I documented that the invasive breast cancer was ER/PR negative but after further pathology review and discussion, the invasive breast cancer was actually ER/PR positive. It was a DCIS that was ER/PR negative. 2. So I'll now recommend antiestrogen therapy with anastrozole 1 mg daily to decrease the likelihood of breast cancer recurrence.  Anastrozole counseling:We discussed the risks and benefits of anti-estrogen therapy with aromatase inhibitors. These include but not limited to insomnia, hot flashes, mood changes, vaginal dryness, bone density loss, and weight gain. We strongly believe that the benefits far outweigh the risks. Patient understands these risks and consented to starting treatment. Planned treatment duration is 5-10 years.  Major depression, anxiety disorder: Status post hospitalization and is under psychiatric care.  Intracerebral hemorrhage: While she was in hospital. No symptoms related to that. She had multiple questions of whether or not she has chemotherapy brain. I discussed with her that she does not have chemo brain.  I will order a bone density test to evaluate for osteoporosis.  Return to clinic in 1 month to assess tolerability to anastrozole.    No orders of the defined types were placed in this encounter.   The patient has a good understanding of the overall plan. she agrees with it. she will call with any problems that may develop before the next visit here.   Rulon Eisenmenger, MD 08/02/2015

## 2015-08-02 NOTE — Assessment & Plan Note (Signed)
Right breast 5 x 6 x 3.8 cm lesion of non-masslike and nodular enhancement: Invasive ductal carcinoma with DCIS, grade 3, ER 100%; PR 63%,, HER-2 negative ratio 1.16, Ki-67 83% Right Breast LOQ: 01/30/15: DCIS involving the papillary lesion ER 51%, PR 45% Left breast 4.5x5x3.5 cm area of non-mass enhancement adjacent to complex sclerosing lesion extending 3.5 cm IDC with DCIS, ER/PR Neg Ki 67 13%; Her 2 Neg; Neo-adjuvant chemotherapy with Taxol and carboplatin weekly 4 started 02/20/2015 -03/13/2015 stopped due to psychiatric illness and hospitalization. Left mastectomy 05/04/15 : IDC grade 2/3; 5 cm with IgG DCIS, 0/5 lymph nodes T3 N0 stage IIB;  Right mastectomy: DCIS low to high-grade multiple foci 5.5 cm 0/2 lymph nodes Tis N0 ------------------------------------------------------------------------------------------------------------------------------------------------------ Recommendation: 1. Pathology: In my previous clinic note, I documented that the invasive breast cancer was ER/PR negative but after further pathology review and discussion, the invasive breast cancer was actually ER/PR positive. It was a DCIS that was ER/PR negative. 2. So I'll now recommend antiestrogen therapy with anastrozole 1 mg daily to decrease the likelihood of breast cancer recurrence.  Anastrozole counseling:We discussed the risks and benefits of anti-estrogen therapy with aromatase inhibitors. These include but not limited to insomnia, hot flashes, mood changes, vaginal dryness, bone density loss, and weight gain. We strongly believe that the benefits far outweigh the risks. Patient understands these risks and consented to starting treatment. Planned treatment duration is 5-10 years.  Major depression, anxiety disorder: Status post hospitalization and is under psychiatric care. Return to clinic in 8 months for follow-up and surveillance  Intracerebral hemorrhage: While she was in hospital. No symptoms related to  that. She had multiple questions of whether or not she has chemotherapy brain. I discussed with her that she does not have chemo brain.  Return to clinic in 1 month to assess tolerability to anastrozole.

## 2015-08-03 ENCOUNTER — Telehealth: Payer: Self-pay | Admitting: Hematology and Oncology

## 2015-08-03 NOTE — Telephone Encounter (Signed)
s.w. pt and advised on OCT appt....pto k and aware she took the [facility] # to call and r/s

## 2015-08-06 ENCOUNTER — Ambulatory Visit: Payer: Medicare Other | Admitting: Neurology

## 2015-08-07 ENCOUNTER — Ambulatory Visit
Admission: RE | Admit: 2015-08-07 | Discharge: 2015-08-07 | Disposition: A | Discharge status: Home / Self Care | Payer: Medicare Other | Source: Ambulatory Visit | Attending: Hematology and Oncology | Admitting: Hematology and Oncology

## 2015-08-07 DIAGNOSIS — C50311 Malignant neoplasm of lower-inner quadrant of right female breast: Secondary | ICD-10-CM

## 2015-08-09 ENCOUNTER — Other Ambulatory Visit: Payer: Self-pay

## 2015-08-14 ENCOUNTER — Encounter: Payer: Self-pay | Admitting: Neurology

## 2015-08-14 ENCOUNTER — Ambulatory Visit (INDEPENDENT_AMBULATORY_CARE_PROVIDER_SITE_OTHER): Payer: Medicare Other | Admitting: Neurology

## 2015-08-14 VITALS — BP 151/86 | HR 86 | Ht 60.0 in | Wt 112.0 lb

## 2015-08-14 DIAGNOSIS — R42 Dizziness and giddiness: Secondary | ICD-10-CM | POA: Diagnosis not present

## 2015-08-14 HISTORY — DX: Dizziness and giddiness: R42

## 2015-08-14 NOTE — Patient Instructions (Addendum)
   We will get you set up for Physical therapy for vestibular rehab.  Dizziness Dizziness is a common problem. It is a feeling of unsteadiness or light-headedness. You may feel like you are about to faint. Dizziness can lead to injury if you stumble or fall. Anyone can become dizzy, but dizziness is more common in older adults. This condition can be caused by a number of things, including medicines, dehydration, or illness. HOME CARE INSTRUCTIONS Taking these steps may help with your condition: Eating and Drinking  Drink enough fluid to keep your urine clear or pale yellow. This helps to keep you from becoming dehydrated. Try to drink more clear fluids, such as water.  Do not drink alcohol.  Limit your caffeine intake if directed by your health care provider.  Limit your salt intake if directed by your health care provider. Activity  Avoid making quick movements.  Rise slowly from chairs and steady yourself until you feel okay.  In the morning, first sit up on the side of the bed. When you feel okay, stand slowly while you hold onto something until you know that your balance is fine.  Move your legs often if you need to stand in one place for a long time. Tighten and relax your muscles in your legs while you are standing.  Do not drive or operate heavy machinery if you feel dizzy.  Avoid bending down if you feel dizzy. Place items in your home so that they are easy for you to reach without leaning over. Lifestyle  Do not use any tobacco products, including cigarettes, chewing tobacco, or electronic cigarettes. If you need help quitting, ask your health care provider.  Try to reduce your stress level, such as with yoga or meditation. Talk with your health care provider if you need help. General Instructions  Watch your dizziness for any changes.  Take medicines only as directed by your health care provider. Talk with your health care provider if you think that your dizziness is  caused by a medicine that you are taking.  Tell a friend or a family member that you are feeling dizzy. If he or she notices any changes in your behavior, have this person call your health care provider.  Keep all follow-up visits as directed by your health care provider. This is important. SEEK MEDICAL CARE IF:  Your dizziness does not go away.  Your dizziness or light-headedness gets worse.  You feel nauseous.  You have reduced hearing.  You have new symptoms.  You are unsteady on your feet or you feel like the room is spinning. SEEK IMMEDIATE MEDICAL CARE IF:  You vomit or have diarrhea and are unable to eat or drink anything.  You have problems talking, walking, swallowing, or using your arms, hands, or legs.  You feel generally weak.  You are not thinking clearly or you have trouble forming sentences. It may take a friend or family member to notice this.  You have chest pain, abdominal pain, shortness of breath, or sweating.  Your vision changes.  You notice any bleeding.  You have a headache.  You have neck pain or a stiff neck.  You have a fever.   This information is not intended to replace advice given to you by your health care provider. Make sure you discuss any questions you have with your health care provider.   Document Released: 04/01/2001 Document Revised: 02/20/2015 Document Reviewed: 10/02/2014 Elsevier Interactive Patient Education [employer].
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
## 2015-08-30 ENCOUNTER — Encounter: Payer: Self-pay | Admitting: Physical Therapy

## 2015-08-30 ENCOUNTER — Ambulatory Visit: Payer: Medicare Other | Attending: Neurology | Admitting: Physical Therapy

## 2015-08-30 DIAGNOSIS — R42 Dizziness and giddiness: Secondary | ICD-10-CM | POA: Diagnosis not present

## 2015-08-30 DIAGNOSIS — R269 Unspecified abnormalities of gait and mobility: Secondary | ICD-10-CM | POA: Diagnosis present

## 2015-08-30 DIAGNOSIS — H8113 Benign paroxysmal vertigo, bilateral: Secondary | ICD-10-CM | POA: Diagnosis present
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
## 2015-08-31 ENCOUNTER — Ambulatory Visit: Payer: Medicare Other | Admitting: Rehabilitative and Restorative Service Providers"

## 2015-08-31 DIAGNOSIS — H8113 Benign paroxysmal vertigo, bilateral: Secondary | ICD-10-CM

## 2015-08-31 DIAGNOSIS — R269 Unspecified abnormalities of gait and mobility: Secondary | ICD-10-CM

## 2015-08-31 DIAGNOSIS — R42 Dizziness and giddiness: Secondary | ICD-10-CM | POA: Diagnosis not present

## 2015-08-31 NOTE — Patient Instructions (Signed)
Tip Card 1.The goal of habituation training is to assist in decreasing symptoms of vertigo, dizziness, or nausea provoked by specific head and body motions. 2.These exercises may initially increase symptoms; however, be persistent and work through symptoms. With repetition and time, the exercises will assist in reducing or eliminating symptoms. 3.Exercises should be stopped and discussed with the therapist if you experience any of the following: - Sudden change or fluctuation in hearing - New onset of ringing in the ears, or increase in current intensity - Any fluid discharge from the ear - Severe pain in neck or back - Extreme nausea  Copyright  VHI. All rights reserved.  Rolling   With pillow under head, start on back. Turn your head to the left and wait until dizziness stops, plus 20 seconds and then turn your head to the right side.  Hold until dizziness stops, plus 20 seconds.  Repeat sequence 5 times per session. Do 2 sessions per day.  Copyright  VHI. All rights reserved.
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
## 2015-09-02 ENCOUNTER — Emergency Department (INDEPENDENT_AMBULATORY_CARE_PROVIDER_SITE_OTHER)
Admission: EM | Admit: 2015-09-02 | Discharge: 2015-09-02 | Disposition: A | Discharge status: Home / Self Care | Payer: Medicare Other | Source: Home / Self Care | Attending: Family Medicine | Admitting: Family Medicine

## 2015-09-02 ENCOUNTER — Encounter: Payer: Self-pay | Admitting: General Surgery

## 2015-09-02 ENCOUNTER — Encounter (HOSPITAL_COMMUNITY): Payer: Self-pay

## 2015-09-02 DIAGNOSIS — R21 Rash and other nonspecific skin eruption: Secondary | ICD-10-CM

## 2015-09-02 MED ORDER — TRIAMCINOLONE ACETONIDE 0.025 % EX OINT: 1.0000 "application " | TOPICAL_OINTMENT | Freq: Two times a day (BID) | CUTANEOUS | Status: DC

## 2015-09-02 NOTE — Discharge Instructions (Signed)
° ° ° ° ° ° °  You have no signs of an infection. Because the rash is along the incision I worry about an incisional reaction to something used in surgery, question delayed reaction. Try the anti-inflammatory cream, but if worsens then we suggest you go to your surgeon for his thoughts. Should you start having fevers, chills or other concerns about infections then please f/u in the ED.

## 2015-09-02 NOTE — Progress Notes (Signed)
She is s/p bilateral mastectomy by Dr. Dalbert Batman in July 2016.  One month ago she started Anastrozole.  She called this AM reporting that she was developing a rash on both chest wall; no fever; no drainage from incision.  I recommended she start taking Benadryl and then call the office tomorrow if no better or worse as she may need to be seen.

## 2015-09-02 NOTE — ED Notes (Signed)
Patient complains of having a rash across her chest area for about a week Rash is located at the exact location for her mastectomy
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
## 2015-09-25 ENCOUNTER — Ambulatory Visit: Payer: Medicare Other | Attending: Neurology | Admitting: Rehabilitative and Restorative Service Providers"

## 2015-09-25 DIAGNOSIS — R269 Unspecified abnormalities of gait and mobility: Secondary | ICD-10-CM | POA: Diagnosis present

## 2015-09-25 DIAGNOSIS — R42 Dizziness and giddiness: Secondary | ICD-10-CM

## 2015-09-25 DIAGNOSIS — H8113 Benign paroxysmal vertigo, bilateral: Secondary | ICD-10-CM | POA: Diagnosis present
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
## 2015-09-25 NOTE — Patient Instructions (Signed)
Tip Card 1.The goal of habituation training is to assist in decreasing symptoms of vertigo, dizziness, or nausea provoked by specific head and body motions. 2.These exercises may initially increase symptoms; however, be persistent and work through symptoms. With repetition and time, the exercises will assist in reducing or eliminating symptoms. 3.Exercises should be stopped and discussed with the therapist if you experience any of the following: - Sudden change or fluctuation in hearing - New onset of ringing in the ears, or increase in current intensity - Any fluid discharge from the ear - Severe pain in neck or back - Extreme nausea  Copyright  VHI. All rights reserved.  Rolling   With pillow under head, start on back. Roll to your right side.  Hold until dizziness stops, plus 20 seconds and then roll to the left side.  Hold until dizziness stops, plus 20 seconds.  Repeat sequence 3-5 times per session. Do 2 sessions per day. *Can begin by moving head to right and then left and work up towards rolling.  Copyright  VHI. All rights reserved.  Sit to Side-Lying   Sit on edge of bed. Lie down onto the right side and hold until dizziness stops, plus 20 seconds.  Return to sitting and wait until dizziness stops, plus 20 seconds.  Repeat to the left side. Repeat sequence 3-5 times per session. Do 2 sessions per day.  Copyright  VHI. All rights reserved.

## 2015-09-27 ENCOUNTER — Ambulatory Visit: Payer: Medicare Other | Admitting: Rehabilitative and Restorative Service Providers"

## 2015-09-27 DIAGNOSIS — R42 Dizziness and giddiness: Secondary | ICD-10-CM

## 2015-09-27 DIAGNOSIS — R269 Unspecified abnormalities of gait and mobility: Secondary | ICD-10-CM

## 2015-09-27 DIAGNOSIS — H8113 Benign paroxysmal vertigo, bilateral: Secondary | ICD-10-CM
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
## 2015-09-28 ENCOUNTER — Telehealth: Payer: Self-pay | Admitting: Rehabilitative and Restorative Service Providers"

## 2015-09-28 NOTE — Telephone Encounter (Signed)
The patient reports she feels that exercises made her feel worse yesterday and she could not walk home from therapy and had to call a cab.  PT and patient discussed that due to multi-canal nature of symptoms, we are provoking dizziness and she should try to sit in our lobby (as we discussed yesterday at her appt) to allow symptoms to settle before attempting to walk.  PT advised waiting 20-30 minutes before leaving clinic. Patient reports that she wants a doctor to re-evaluate her to reinforce what we are working on therapy.  PT told her to call primary care MD or referring MD as needed.    She feels improved this morning compared to yesterday and plans to try her HEP when a friend at her home today for safety.

## 2015-10-01 ENCOUNTER — Telehealth: Payer: Self-pay | Admitting: Neurology

## 2015-10-01 NOTE — Telephone Encounter (Signed)
Pt called sts she cannot come on Friday. She does not drive. She said [place] offered he 11:00 tomorrow. She has a ride that will bring her at 11:15 not 11:00.

## 2015-10-01 NOTE — Telephone Encounter (Signed)
I called the patient. The patient indicates that she is not having much vertigo, but she indicates that the vestibular therapy worsens her walking after the sessions, she has had 4 or 5 sessions. She may cancel further sessions. She indicates that she has gait imbalance without dizziness. I will see her back in office for an evaluation.

## 2015-10-01 NOTE — Telephone Encounter (Signed)
I called the patient. She states her dizziness in much worse. She has had to resort to using a cane again. She is concerned vestibular therapy is doing more harm than good. She would like to speak to Dr. Jannifer Franklin about this today as her next appointment is tomorrow. I advised he would return her call when he is done seeing patients for the day.

## 2015-10-01 NOTE — Telephone Encounter (Signed)
Patient is calling and states that she is very off balance and that the sessions to redistribute the crystals in her ears are making her worse.  Please call.

## 2015-10-01 NOTE — Telephone Encounter (Signed)
Pt called back and scheduled appt for 9am tomorrow

## 2015-10-01 NOTE — Telephone Encounter (Signed)
I called the patient. Appointment scheduled 12/16.

## 2015-10-02 ENCOUNTER — Ambulatory Visit: Payer: Medicare Other | Admitting: Rehabilitative and Restorative Service Providers"

## 2015-10-02 ENCOUNTER — Ambulatory Visit (INDEPENDENT_AMBULATORY_CARE_PROVIDER_SITE_OTHER): Payer: Medicare Other | Admitting: Neurology

## 2015-10-02 ENCOUNTER — Encounter: Payer: Self-pay | Admitting: Neurology

## 2015-10-02 VITALS — BP 146/80 | HR 77 | Ht 60.0 in | Wt 113.0 lb

## 2015-10-02 DIAGNOSIS — R42 Dizziness and giddiness: Secondary | ICD-10-CM | POA: Diagnosis not present

## 2015-10-02 DIAGNOSIS — R269 Unspecified abnormalities of gait and mobility: Secondary | ICD-10-CM

## 2015-10-02 HISTORY — DX: Unspecified abnormalities of gait and mobility: R26.9
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
## 2015-10-02 NOTE — Patient Instructions (Signed)

## 2015-10-04 ENCOUNTER — Encounter: Payer: Self-pay | Admitting: Rehabilitative and Restorative Service Providers"

## 2015-10-05 ENCOUNTER — Ambulatory Visit: Payer: Self-pay | Admitting: Neurology

## 2015-10-10 ENCOUNTER — Telehealth: Payer: Self-pay | Admitting: Rehabilitative and Restorative Service Providers"

## 2015-10-10 NOTE — Telephone Encounter (Signed)
F/u with the patient re: visit with MD and her plan of care.   She reports "I don't know what to do, I'm scared.".  She reports that she continues with dizziness and notes that certain movements provoke dizziness.  She reports she quit the exercises for a week and then returned to them this week.  "The ones where I lay down are too serious."   PT recommended she try to return to doing HEP when friend over at her house and the patient also wanted to schedule.    See appt desk for appointments.

## 2015-10-12 ENCOUNTER — Ambulatory Visit: Payer: Medicare Other | Admitting: Rehabilitative and Restorative Service Providers"

## 2015-10-12 DIAGNOSIS — R42 Dizziness and giddiness: Secondary | ICD-10-CM

## 2015-10-12 DIAGNOSIS — H8113 Benign paroxysmal vertigo, bilateral: Secondary | ICD-10-CM | POA: Diagnosis not present

## 2015-10-12 DIAGNOSIS — R269 Unspecified abnormalities of gait and mobility: Secondary | ICD-10-CM

## 2015-10-12 NOTE — Patient Instructions (Signed)
Tip Card 1.The goal of habituation training is to assist in decreasing symptoms of vertigo, dizziness, or nausea provoked by specific head and body motions. 2.These exercises may initially increase symptoms; however, be persistent and work through symptoms. With repetition and time, the exercises will assist in reducing or eliminating symptoms. 3.Exercises should be stopped and discussed with the therapist if you experience any of the following: - Sudden change or fluctuation in hearing - New onset of ringing in the ears, or increase in current intensity - Any fluid discharge from the ear - Severe pain in neck or back - Extreme nausea  Copyright  VHI. All rights reserved.  Rolling   With pillow under head, start on back. Roll to your right side. Hold until dizziness stops, plus 20 seconds and then roll to the left side. Hold until dizziness stops, plus 20 seconds. Repeat sequence 3-5 times per session. Do 2 sessions per day. *Can begin by moving head to right and then left and work up towards rolling.  Copyright  VHI. All rights reserved.  Sit to Side-Lying   Sit on edge of bed. Lie down onto the right side and hold until dizziness stops, plus 20 seconds. Return to sitting and wait until dizziness stops, plus 20 seconds. Repeat to the left side. Repeat sequence 3-5 times per session. Do 2 sessions per day.  Copyright  VHI. All rights reserved.
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
## 2015-10-23 ENCOUNTER — Ambulatory Visit: Payer: Medicare Other | Attending: Neurology | Admitting: Rehabilitative and Restorative Service Providers"

## 2015-10-23 DIAGNOSIS — R42 Dizziness and giddiness: Secondary | ICD-10-CM | POA: Insufficient documentation

## 2015-10-23 DIAGNOSIS — R269 Unspecified abnormalities of gait and mobility: Secondary | ICD-10-CM | POA: Diagnosis present
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
## 2015-11-01 ENCOUNTER — Ambulatory Visit: Payer: Medicare Other | Admitting: Rehabilitative and Restorative Service Providers"

## 2015-11-01 ENCOUNTER — Telehealth: Payer: Self-pay | Admitting: Rehabilitative and Restorative Service Providers"

## 2015-11-01 ENCOUNTER — Encounter: Payer: Medicare Other | Admitting: Rehabilitative and Restorative Service Providers"

## 2015-11-01 NOTE — Therapy (Signed)
[facility] [address] [place], [place], [address] Phone: [phone]   Fax:  [phone]  Patient Details  Name: Grace Paul MRN: [id number] Date of Birth: Mar 12, 1935 Referring Provider:  No ref. provider found  Encounter Date: 10/23/2015  PHYSICAL THERAPY DISCHARGE SUMMARY  Visits from Start of Care: 6  Current functional level related to goals / functional outcomes:     PT Short Term Goals - 10/23/15 1038    PT SHORT TERM GOAL #1   Title Positional vertigo testing will be negative to indicate resolved BPPV.  Target date: 10/27/2015   Baseline The patient no longer presents with multi-canal vertigo, she has dizziness with sit>R sidelying on first repetition only of brandt daroff.   Status Partially Met   PT SHORT TERM GOAL #2   Title Pt will decrease DHI score from 30 to > 20 to indicate significant decrease in pt-perceived disability due to dizziness. Target date: 10/27/2015   Baseline Did not reassess due to the patient taking meds over the weekend that aggravated dizziness- she reports she discontinued the meds (PT rec she call MD)   Status Deferred   PT SHORT TERM GOAL #3   Title Assess strength and balance, if indicated, after vertigo clears. Target date: 10/27/2015   Baseline Patient is performing HEP regularly for balance from prior HEP from [facility] PT.   Status Achieved        Remaining deficits: Minimal c/o dizziness addressed through HEP   Education / Equipment: HEP for post d/c.  Plan: Patient agrees to discharge.  Patient goals were partially met. Patient is being discharged due to meeting the stated rehab goals.  ?????       Thank you for the referral of this patient. Rudell Cobb, MPT  Grace Paul 11/01/2015, 2:52 PM  [facility] [address] [place], [place], [address] Phone: [phone]   Fax:  [phone]

## 2015-11-01 NOTE — Telephone Encounter (Signed)
The patient returned PT call and reports that she is continuing to improve by doing HEP and notes only occasional, minimal dizziness.  She does not feel she needs a f/u visit and we discussed d/c over the phone.

## 2015-11-05 ENCOUNTER — Telehealth: Payer: Self-pay | Admitting: Neurology

## 2015-11-05 NOTE — Telephone Encounter (Signed)
error 

## 2015-11-08 ENCOUNTER — Encounter: Payer: Self-pay | Admitting: Rehabilitative and Restorative Service Providers"

## 2015-12-26 ENCOUNTER — Telehealth: Payer: Self-pay

## 2015-12-26 NOTE — Telephone Encounter (Signed)
Patient called stating that she needs clarification  On how long Dr. Lindi Adie said she should be in the sun.  She was sitting in the sun for 20 min. and began to burn. Told her that Dr. Lindi Adie just wanted her to walk for 30 minutes in the sun light prior to 11 am or after 3 pm with sunscreen on to help produce some vitamin D.  She needs to wear at least SPF of 15 or higher.  Grace Paul verbalized understanding.

## 2016-01-16 IMAGING — MG MM DIGITAL DIAGNOSTIC BILAT CAD
4 series · 4 of 4 positions shown · non-contrast
Comparison: Previous exam(s).

CLINICAL DATA: Status post bilateral MR guided core needle
biopsies. Recently diagnosed right breast invasive ductal carcinoma
and ductal carcinoma in situ and left breast complex sclerosing
lesion.

EXAM:
DIAGNOSTIC BILATERAL MAMMOGRAM POST ULTRASOUND BIOPSY

[R CC]
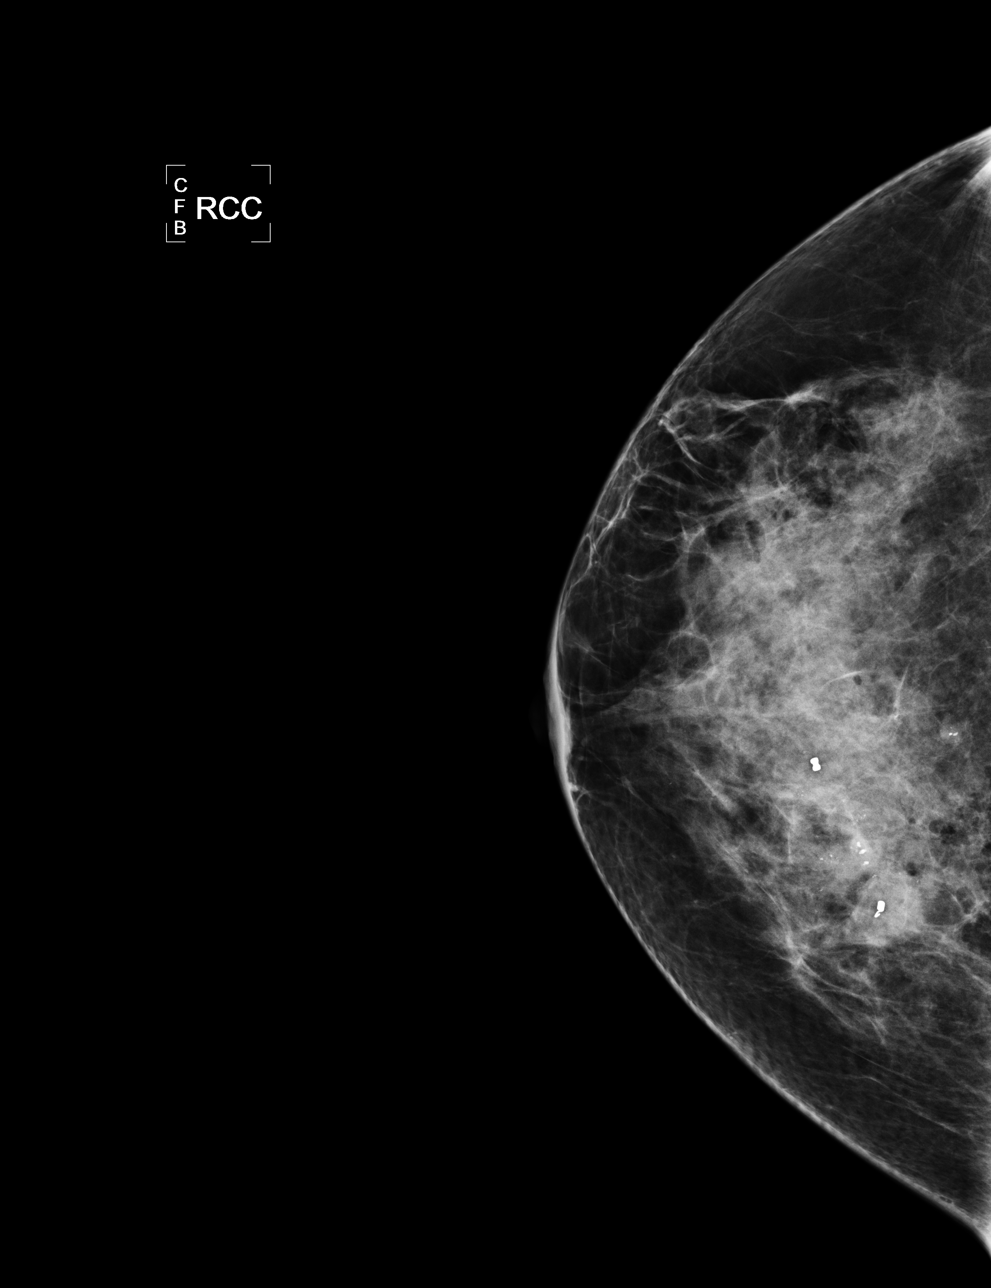

[L CC]
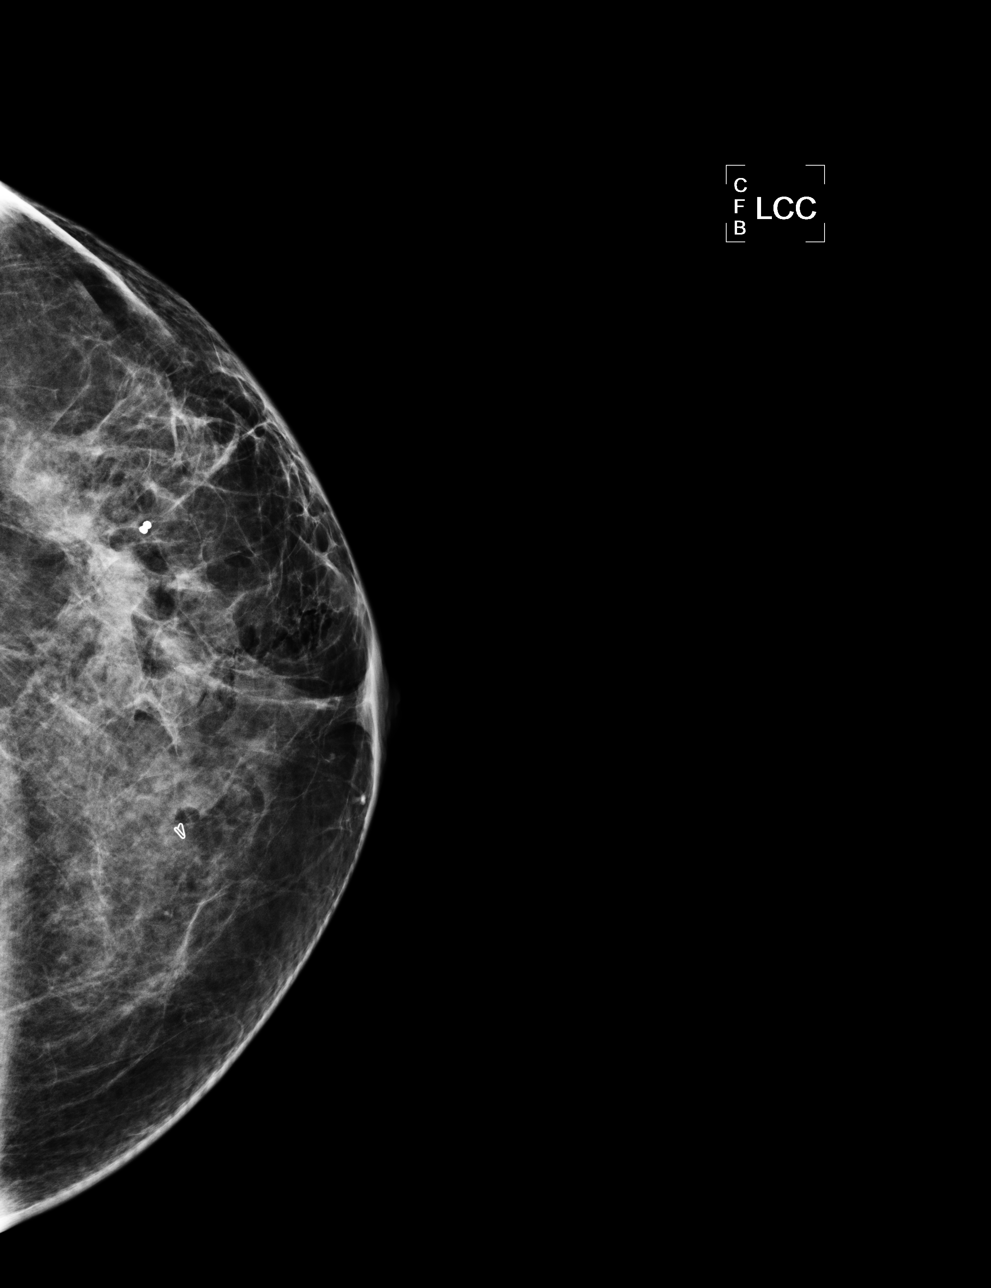

[L ML]
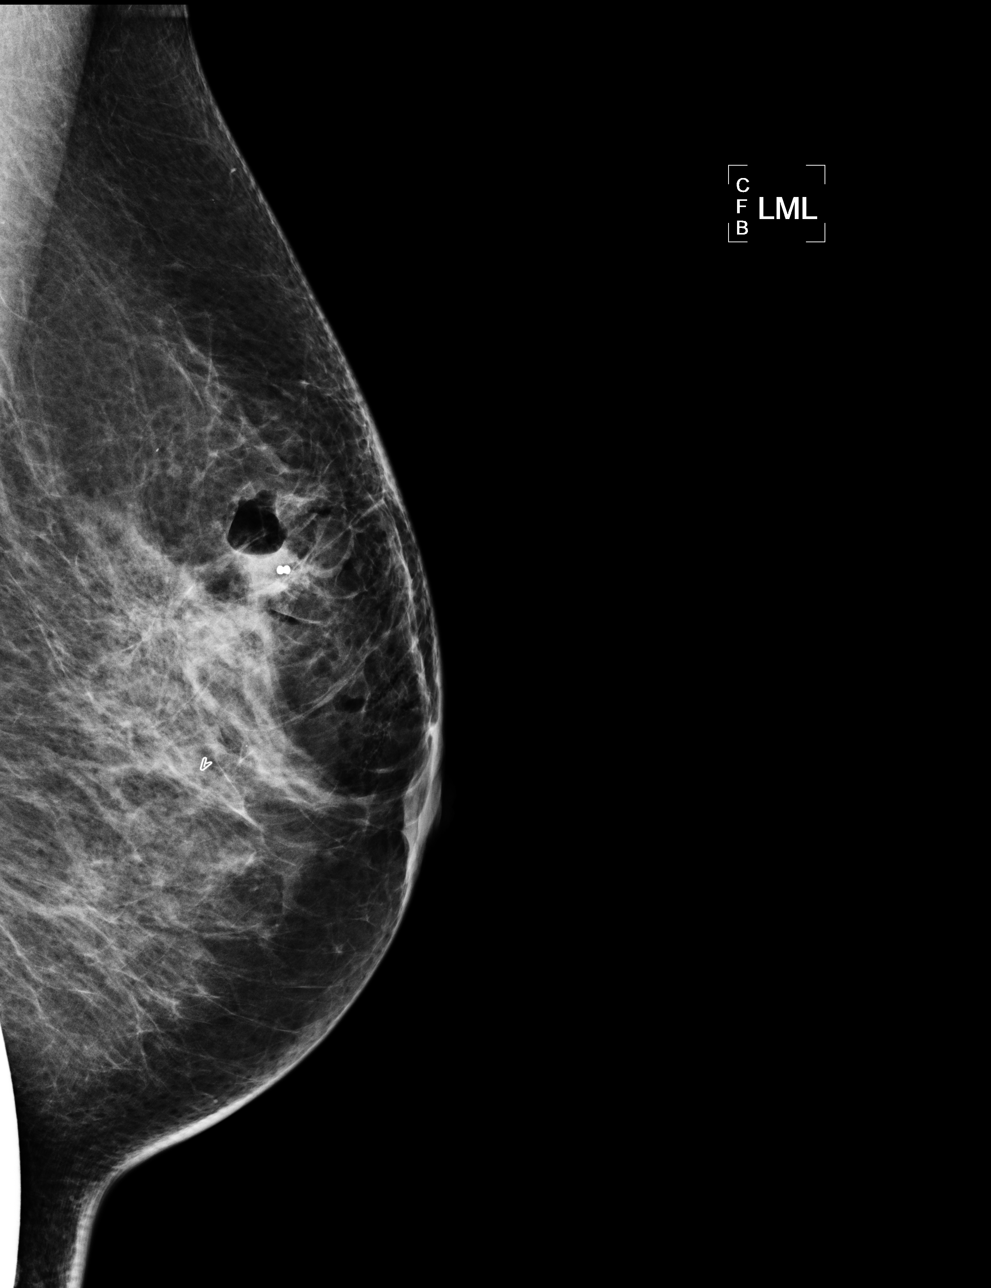

[R ML]
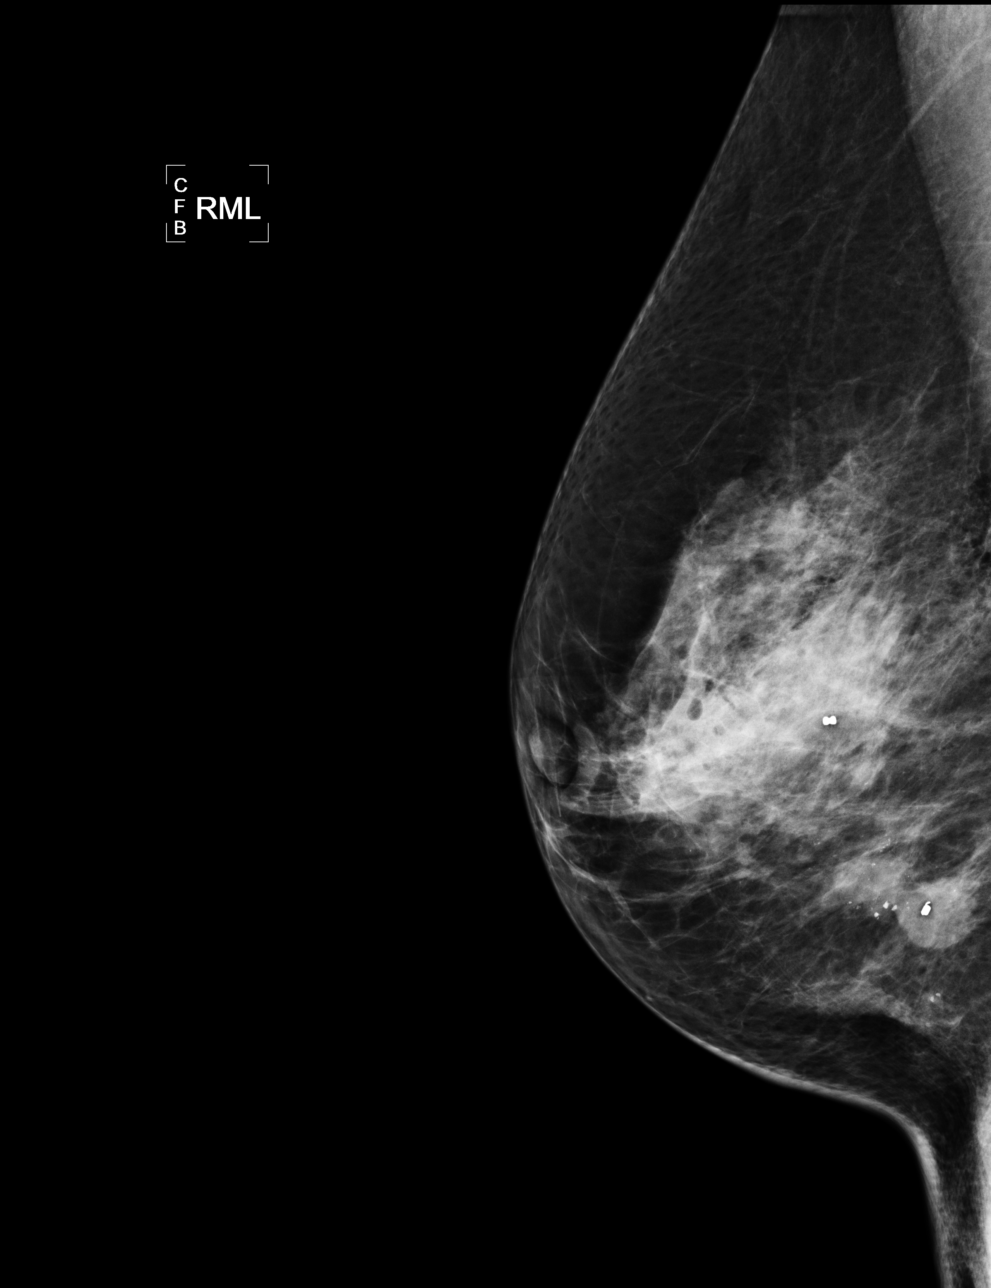

[4 of 4 positions shown; findings below may reference images not displayed]

FINDINGS: Mammographic images were obtained following MR guided biopsy of the
anterior, superior, lateral aspect of a 6 cm area of nodular and non
mass enhancement in the central right breast and the superior,
anterior aspect of a 5 cm area of nodular and non mass enhancement
in the central left breast.

These demonstrate an hourglass shaped biopsy marker clip in the
central right breast, slightly laterally, at the expected location
of the area biopsied today. This is located 3.6 cm superior, lateral
and anterior to a previously placed coil shaped biopsy marker clip
at the location of previously biopsied invasive ductal carcinoma and
ductal carcinoma in situ.

An hourglass shaped biopsy marker clip is demonstrated in the
upper-outer quadrant of the left breast at the expected location of
the area biopsied under MR guidance. This is located 5.2 cm lateral
and superior to a previously placed heart shaped biopsy marker clip
at the location of previously a biopsied complex sclerosing lesion.
IMPRESSION: Appropriate bilateral clip deployment following MR guided core
needle biopsy, as described above.

Final Assessment: Post Procedure Mammograms for Marker Placement

## 2016-01-24 IMAGING — DX DG CHEST 1V PORT
1 series · 1 of 1 positions shown · non-contrast
Comparison: 02/07/2015

CLINICAL DATA: Port-A-Cath placement.

EXAM:
PORTABLE CHEST - 1 VIEW

[chest ap]
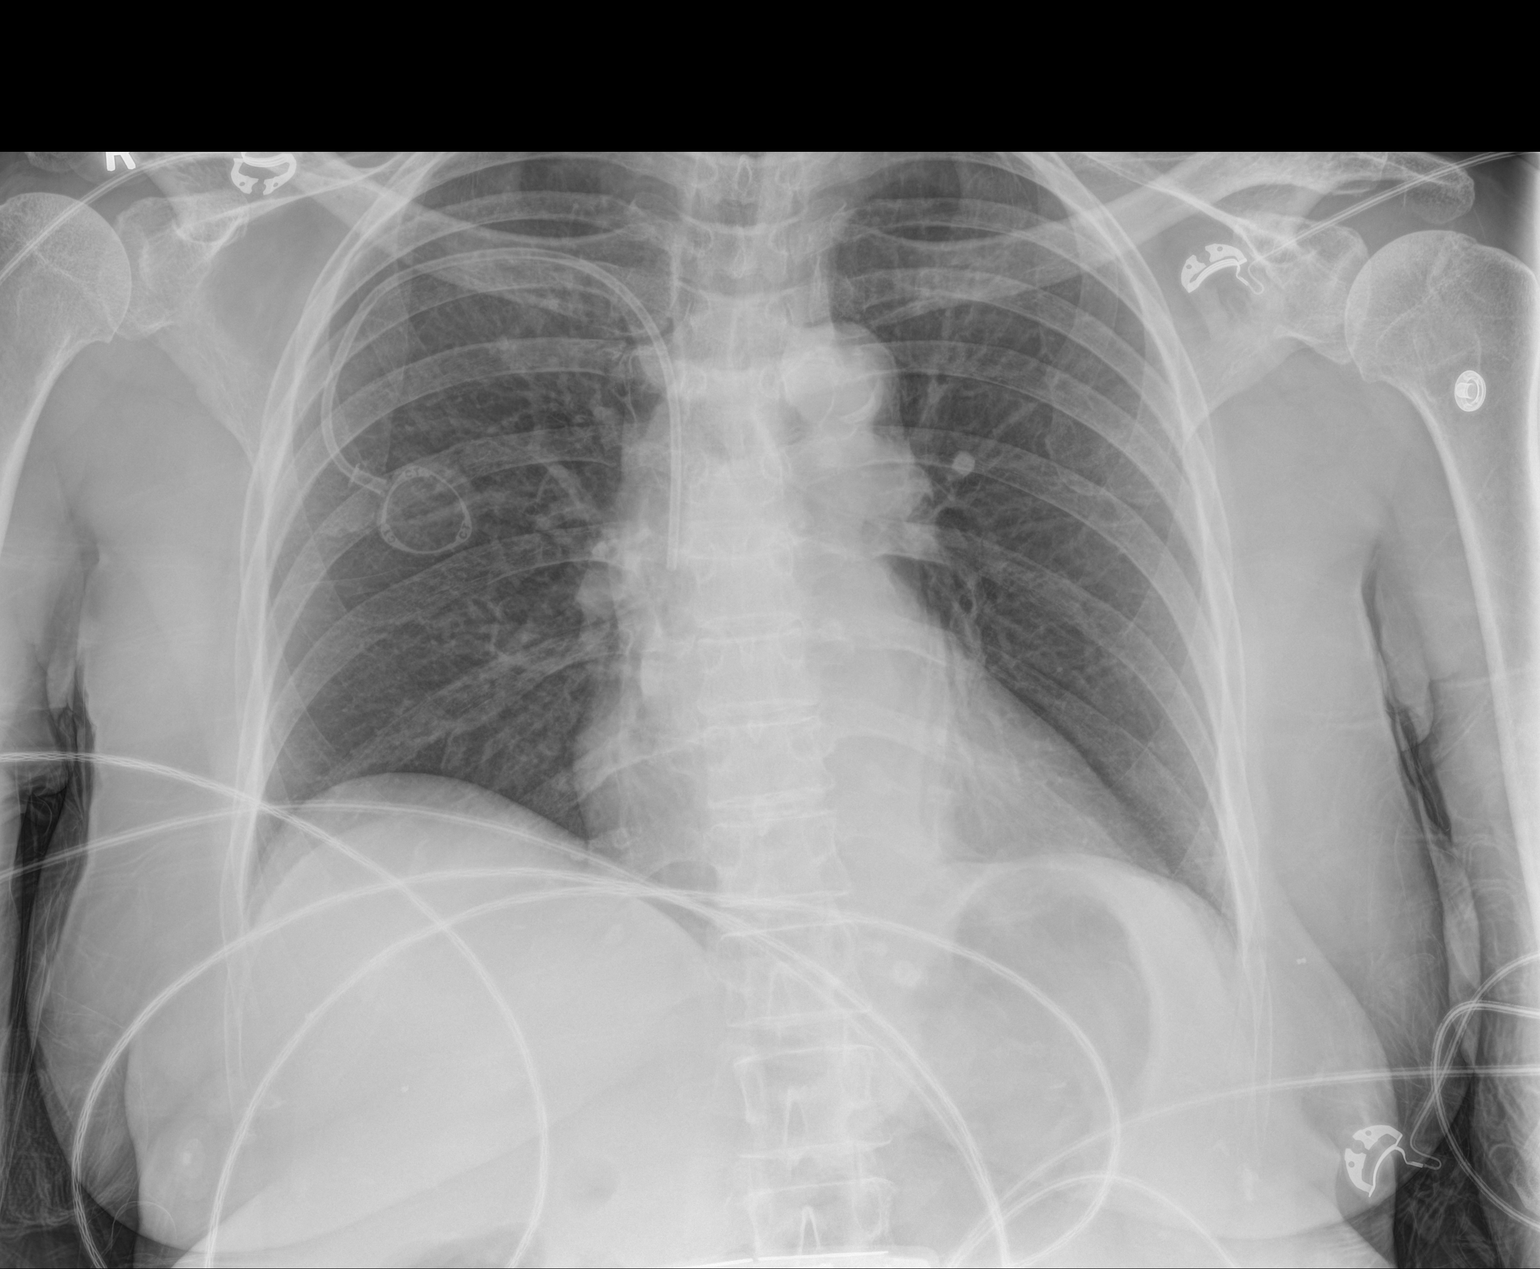

[1 of 1 positions shown; findings below may reference images not displayed]

FINDINGS: Power port has been inserted. The tip is in the superior vena cava
2.7 cm below the carina in good position.

No pneumothorax. Heart size and vascularity are normal. Minimal
atelectasis at the left lung base medially. No effusion. No osseous
abnormality.
IMPRESSION: Port-A-Cath in good position. Minimal atelectasis at the left lung
base medially.

## 2016-01-24 IMAGING — CR DG CHEST 2V
2 series · 2 of 2 positions shown · non-contrast
Comparison: None.

CLINICAL DATA: Port-A-Cath placement preoperative chest.

EXAM:
CHEST  2 VIEW

[w chest pa]
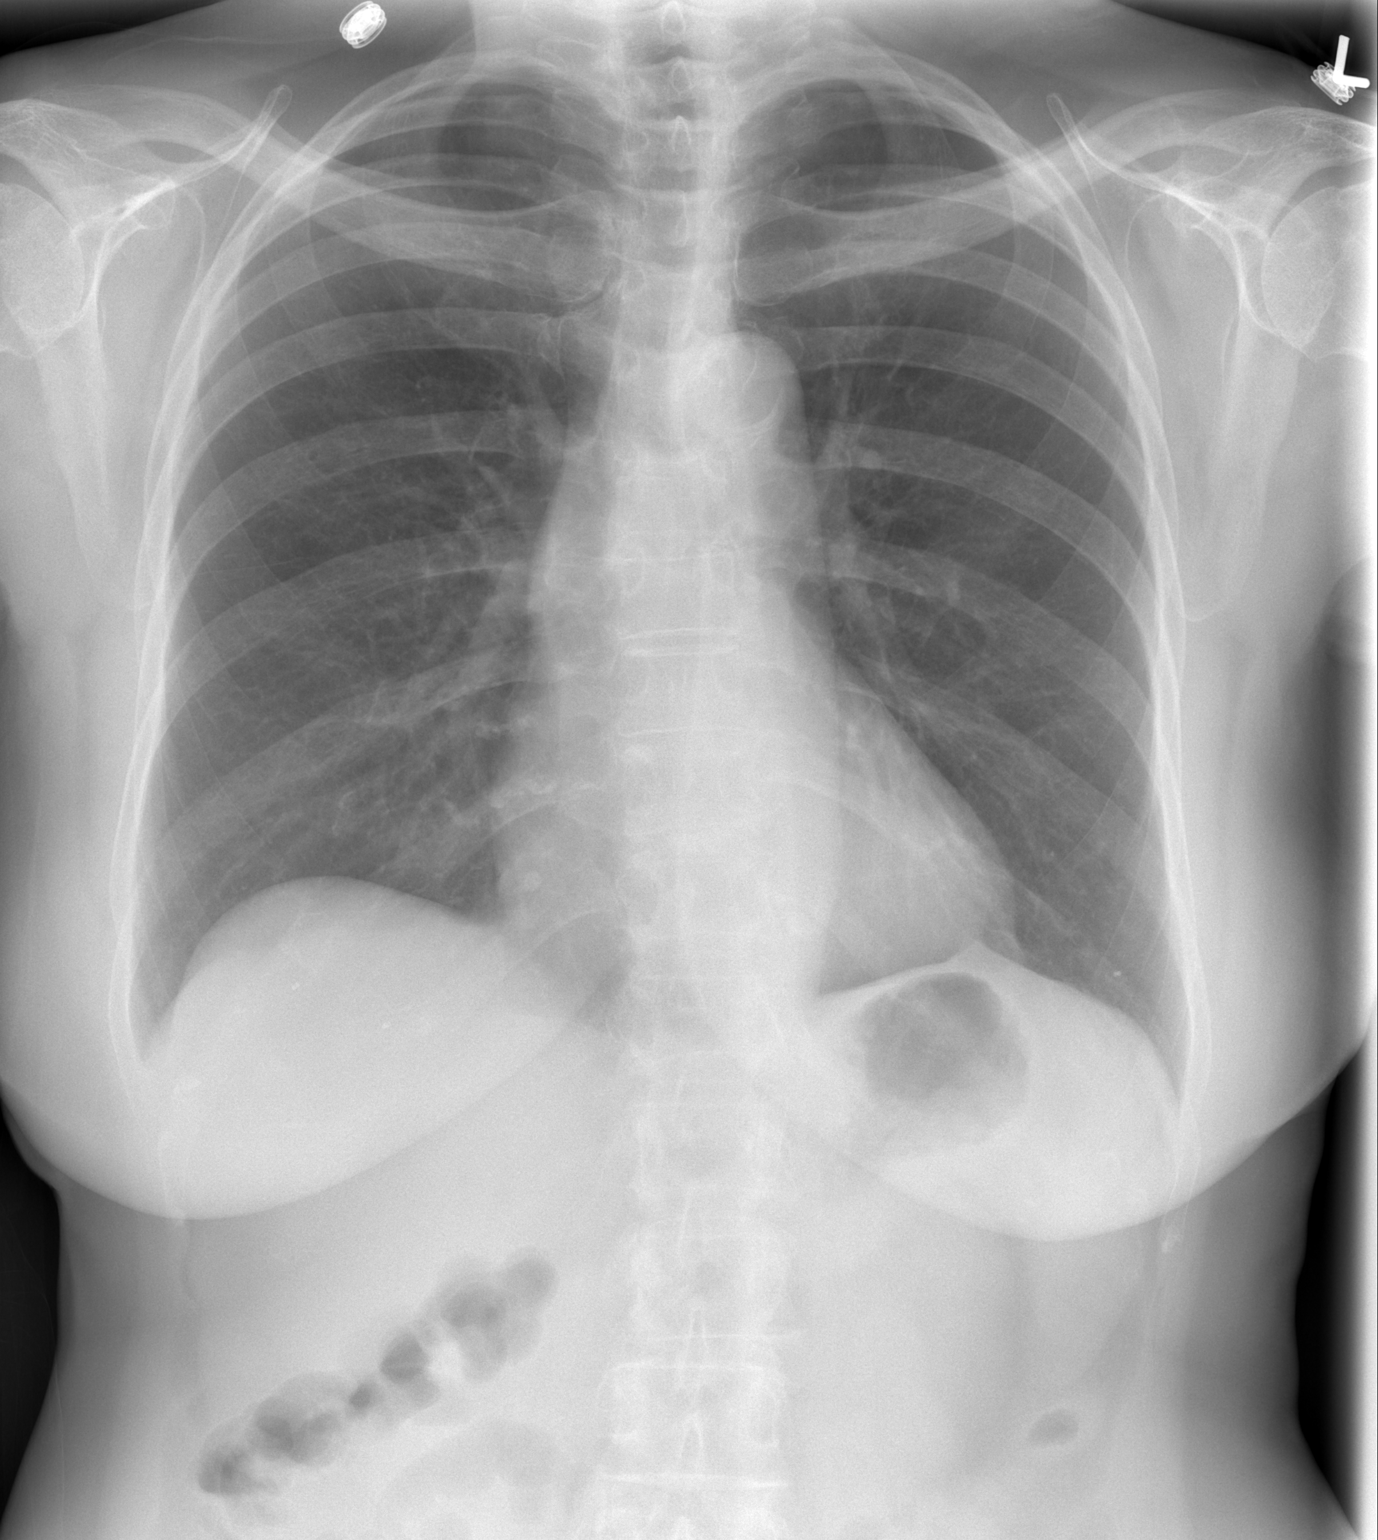

[w chest lat]
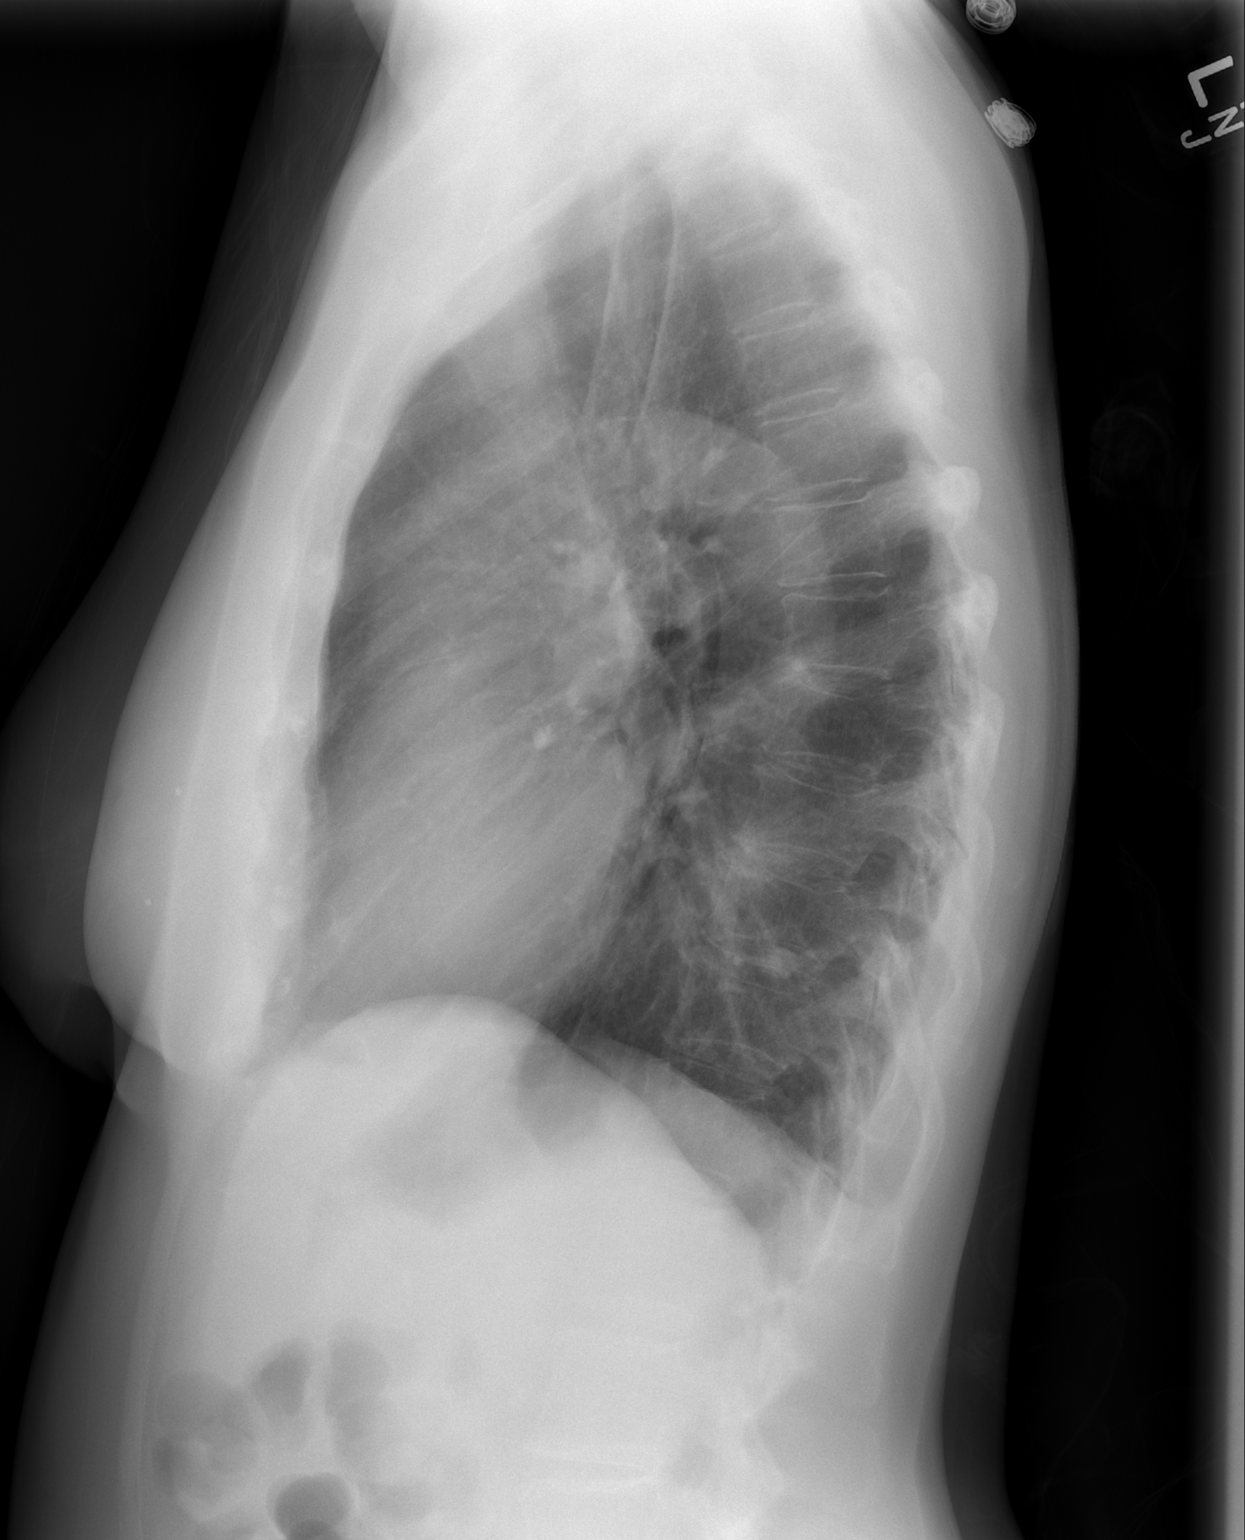

[2 of 2 positions shown; findings below may reference images not displayed]

FINDINGS: Mediastinum hilar structures normal. Lungs are clear. No pleural
effusion or pneumothorax. Chest stable from prior exam. No acute
bony abnormality .
IMPRESSION: No active cardiopulmonary disease.

## 2016-03-06 ENCOUNTER — Ambulatory Visit: Payer: Self-pay | Admitting: Hematology and Oncology

## 2016-03-20 ENCOUNTER — Ambulatory Visit
Admission: RE | Admit: 2016-03-20 | Discharge: 2016-03-20 | Disposition: A | Discharge status: Home / Self Care | Payer: Medicare Other | Source: Ambulatory Visit | Attending: Cardiovascular Disease | Admitting: Cardiovascular Disease

## 2016-03-20 ENCOUNTER — Other Ambulatory Visit: Payer: Self-pay | Admitting: Cardiovascular Disease

## 2016-03-20 DIAGNOSIS — J189 Pneumonia, unspecified organism: Secondary | ICD-10-CM

## 2016-03-20 DIAGNOSIS — J42 Unspecified chronic bronchitis: Secondary | ICD-10-CM

## 2016-03-21 ENCOUNTER — Telehealth: Payer: Self-pay | Admitting: *Deleted

## 2016-03-21 ENCOUNTER — Other Ambulatory Visit: Payer: Self-pay | Admitting: *Deleted

## 2016-03-21 DIAGNOSIS — C50311 Malignant neoplasm of lower-inner quadrant of right female breast: Secondary | ICD-10-CM

## 2016-03-21 NOTE — Telephone Encounter (Signed)
Spoke with patient to follow up.  She states she is doing well.  She is seeing her therapist once a month and she states she feels great!  She states "breast cancer has changed me but in a good way".  Encouraged her to call with any needs or concerns.  She is aware of appt. With Dr. Lindi Adie 6/6.

## 2016-03-25 ENCOUNTER — Ambulatory Visit (HOSPITAL_BASED_OUTPATIENT_CLINIC_OR_DEPARTMENT_OTHER): Payer: Medicare Other | Admitting: Hematology and Oncology

## 2016-03-25 ENCOUNTER — Encounter: Payer: Self-pay | Admitting: Nurse Practitioner

## 2016-03-25 ENCOUNTER — Encounter: Payer: Self-pay | Admitting: Hematology and Oncology

## 2016-03-25 ENCOUNTER — Telehealth: Payer: Self-pay | Admitting: Hematology and Oncology

## 2016-03-25 VITALS — BP 154/72 | HR 73 | Temp 98.7°F | Resp 18 | Ht 60.0 in | Wt 120.9 lb

## 2016-03-25 DIAGNOSIS — C50311 Malignant neoplasm of lower-inner quadrant of right female breast: Secondary | ICD-10-CM

## 2016-03-25 DIAGNOSIS — F411 Generalized anxiety disorder: Secondary | ICD-10-CM

## 2016-03-25 DIAGNOSIS — F322 Major depressive disorder, single episode, severe without psychotic features: Secondary | ICD-10-CM

## 2016-03-25 MED ORDER — HYDROCODONE-ACETAMINOPHEN 5-325 MG PO TABS: 1.0000 | ORAL_TABLET | Freq: Every day | ORAL | Status: DC

## 2016-03-25 NOTE — Progress Notes (Signed)
The Survivorship Care Plan was mailed to Grace Paul as she reported not being able to come in to the Survivorship Clinic for an in-person visit at this time per Nurse Navigators. A letter was mailed to her outlining the purpose of the content of the care plan, as well as encouraging her to reach out to me with any questions or concerns.  My business card was included in the correspondence to the patient as well.  A copy of the care plan was also routed/faxed/mailed to [facility] S, MD, the patient's PCP.  I will not be placing any follow-up appointments to the Survivorship Clinic for Grace Paul, but I am happy to see her at any time in the future for any survivorship concerns that may arise. Thank you for allowing me to participate in her care!  Kenn File, [place] [phone]

## 2016-03-25 NOTE — Progress Notes (Signed)
Patient Care Team: Dixie Dials, MD as PCP - General (Cardiology) [name], MD as Consulting Physician (General Surgery) Nicholas Lose, MD as Consulting Physician (Hematology and Oncology) Arloa Koh, MD as Consulting Physician (Radiation Oncology) Rockwell Germany, RN as Registered Nurse Mauro Kaufmann, RN as Registered Nurse Holley Bouche, NP as Nurse Practitioner (Nurse Practitioner) Thornell Sartorius, MD as Consulting Physician (Otolaryngology)  DIAGNOSIS: Breast cancer of lower-inner quadrant of right female breast [facility])   Staging form: Breast, AJCC 7th Edition     Clinical stage from 01/24/2015: Stage IIA (T2, N0, M0) - Unsigned   SUMMARY OF ONCOLOGIC HISTORY:   Breast cancer of lower-inner quadrant of right female breast ([place])   01/10/2015 Imaging Ultrasound breast: Right breast 4:00: 2.3 cm lesion, at 4:00 6 cm from nipple to 0.4 cm hypoechoic mass, left breast 1130; 2 cm from nipple 0.4 cm mass   01/12/2015 Initial Diagnosis Right breast biopsy: Invasive ductal carcinoma with DCIS, grade 3, ER 0%, PR 0%, HER-2 negative ratio 1.16, Ki-67 83%; left breast biopsy complex sclerosing lesion   01/19/2015 Breast MRI Right breast 5 x 6 x 3.8 cm lesion of non-masslike and nodular enhancement: Left breast 4.5x5x3.5 cm area of non-mass enhancement adjacent to complex sclerosing lesion extending 3.5 sinus lateral to biopsy, no lymph nodes   01/30/2015 Initial Biopsy Left Breast Biopsy: IDC with DCIS, ER 100%; PR 63%, Ki 67 13%; Her 2 Neg; Right: LOQ ER 51%, PR 45%   02/20/2015 - 03/13/2015 Neo-Adjuvant Chemotherapy Neoadjuvant Taxol and carboplatin weekly 4, stopped early because of psychiatric illness   03/15/2015 - 03/22/2015 Hospital Admission Severe generalized anxiety disorder, severe depression, small bitemporal and left frontal subarachnoid hemorrhage from a fall in the hospital   05/04/2015 Surgery Left mastectomy: IDC grade 2/3; 5 cm with DCIS, 0/5 lymph nodes ER/PR Pos, Her 2 Neg;T3  N0 stage IIB; right mastectomy: DCIS low to high-grade multiple foci 5.5 cm 0/2 lymph nodes Tis N0 ER/PR Neg   07/26/2015 -  Anti-estrogen oral therapy Anastrozole 1 mg daily 5-10 years   CHIEF COMPLIANT: Follow-up on anastrozole  INTERVAL HISTORY: Grace Paul is a 81 year old with above-mentioned history of right breast cancer currently on anastrozole. She has been tolerating it fairly well. She denies any hot flashes or myalgias. She does have mild to moderate fatigue. She follows with Dr. Dalbert Batman regarding her bilateral mastectomies. Her emotional state is significantly better.  REVIEW OF SYSTEMS:   Constitutional: Denies fevers, chills or abnormal weight loss Eyes: Denies blurriness of vision Ears, nose, mouth, throat, and face: Denies mucositis or sore throat Respiratory: Denies cough, dyspnea or wheezes Cardiovascular: Denies palpitation, chest discomfort Gastrointestinal:  Denies nausea, heartburn or change in bowel habits Skin: Denies abnormal skin rashes Lymphatics: Denies new lymphadenopathy or easy bruising Neurological:Denies numbness, tingling or new weaknesses Behavioral/Psych: Doing significantly better with the aid of her psychiatrist and her primary care physician  Extremities: No lower extremity edema Breast:  Indeterminate discomfort related to prior mastectomies All other systems were reviewed with the patient and are negative.  I have reviewed the past medical history, past surgical history, social history and family history with the patient and they are unchanged from previous note.  ALLERGIES:  is allergic to chlorpromazine; gentian violet; halcion ; remeron ; trifluoperazine; abilify; clarithromycin; cleocin; compazine; epinephrine; hyoscyamine sulfate; and trazodone.  MEDICATIONS:  Current Outpatient Prescriptions  Medication Sig Dispense Refill  . acetaminophen (TYLENOL) 325 MG tablet Take 650 mg by mouth every 6 (six)  hours as needed.    Marland Kitchen amLODipine (NORVASC)  2.5 MG tablet Take 2.5 mg by mouth every morning.    Marland Kitchen anastrozole (ARIMIDEX) 1 MG tablet Take 1 tablet (1 mg total) by mouth daily. 90 tablet 3  . aspirin 81 MG tablet Take 81 mg by mouth daily.    Marland Kitchen docusate sodium (COLACE) 100 MG capsule Take 100 mg by mouth daily as needed for mild constipation.     . fluconazole (DIFLUCAN) 100 MG tablet Take 100 mg by mouth daily. Reported on 10/23/2015    . HYDROcodone-acetaminophen (NORCO/VICODIN) 5-325 MG tablet Take 1 tablet by mouth daily. 30 tablet 0  . lamoTRIgine (LAMICTAL) 100 MG tablet Take 100 mg by mouth daily.    Marland Kitchen LORazepam (ATIVAN) 1 MG tablet TAKE 1/2 TABLET BY MOUTH  3 TIMES DAILY AS NEEDED FOR ANXIETY  0  . meclizine (ANTIVERT) 25 MG tablet Take 1 tablet (25 mg total) by mouth 3 (three) times daily as needed. 30 tablet 0  . metoprolol succinate (TOPROL-XL) 25 MG 24 hr tablet Take 12.5 mg by mouth 2 (two) times daily.     Marland Kitchen Propylene Glycol (SYSTANE BALANCE) 0.6 % SOLN Place 1 drop into both eyes at bedtime.    . triamcinolone (KENALOG) 0.025 % ointment Apply 1 application topically 2 (two) times daily. 30 g 0  . vitamin B-12 (CYANOCOBALAMIN) 1000 MCG tablet Take 1,000 mcg by mouth daily.     No current facility-administered medications for this visit.    PHYSICAL EXAMINATION: ECOG PERFORMANCE STATUS: 1 - Symptomatic but completely ambulatory  Filed Vitals:   03/25/16 1459  BP: 154/72  Pulse: 73  Temp: 98.7 F (37.1 C)  Resp: 18   Filed Weights   03/25/16 1459  Weight: 120 lb 14.4 oz (54.84 kg)    GENERAL:alert, no distress and comfortable SKIN: skin color, texture, turgor are normal, no rashes or significant lesions EYES: normal, Conjunctiva are pink and non-injected, sclera clear OROPHARYNX:no exudate, no erythema and lips, buccal mucosa, and tongue normal  NECK: supple, thyroid normal size, non-tender, without nodularity LYMPH:  no palpable lymphadenopathy in the cervical, axillary or inguinal LUNGS: clear to auscultation  and percussion with normal breathing effort HEART: regular rate & rhythm and no murmurs and no lower extremity edema ABDOMEN:abdomen soft, non-tender and normal bowel sounds MUSCULOSKELETAL:no cyanosis of digits and no clubbing  NEURO: alert & oriented x 3 with fluent speech, no focal motor/sensory deficits EXTREMITIES: No lower extremity edema  LABORATORY DATA:  I have reviewed the data as listed   Chemistry      Component Value Date/Time   NA 140 06/24/2015 1247   NA 137 03/13/2015 0853   K 3.7 06/24/2015 1247   K 4.2 03/13/2015 0853   CL 107 06/24/2015 1247   CO2 26 06/24/2015 1247   CO2 23 03/13/2015 0853   BUN 10 06/24/2015 1247   BUN 10.7 03/13/2015 0853   CREATININE 0.75 06/24/2015 1247   CREATININE 0.8 03/13/2015 0853      Component Value Date/Time   CALCIUM 9.4 06/24/2015 1247   CALCIUM 9.3 03/13/2015 0853   ALKPHOS 71 05/28/2015 1613   ALKPHOS 67 03/13/2015 0853   AST 19 05/28/2015 1613   AST 18 03/13/2015 0853   ALT 17 05/28/2015 1613   ALT 18 03/13/2015 0853   BILITOT 0.5 05/28/2015 1613   BILITOT 0.33 03/13/2015 0853       Lab Results  Component Value Date   WBC 8.9 06/24/2015  HGB 13.9 06/24/2015   HCT 41.2 06/24/2015   MCV 90.7 06/24/2015   PLT 210 06/24/2015   NEUTROABS 7.0 05/02/2015     ASSESSMENT & PLAN:  Breast cancer of lower-inner quadrant of right female breast Right breast 5 x 6 x 3.8 cm lesion of non-masslike and nodular enhancement: Invasive ductal carcinoma with DCIS, grade 3, ER 100%; PR 63%,, HER-2 negative ratio 1.16, Ki-67 83% Right Breast LOQ: 01/30/15: DCIS involving the papillary lesion ER 51%, PR 45% Left breast 4.5x5x3.5 cm area of non-mass enhancement adjacent to complex sclerosing lesion extending 3.5 cm IDC with DCIS, ER/PR Neg Ki 67 13%; Her 2 Neg; Neo-adjuvant chemotherapy with Taxol and carboplatin weekly 4 started 02/20/2015 -03/13/2015 stopped due to psychiatric illness and hospitalization. Left mastectomy 05/04/15 :  IDC grade 2/3; 5 cm with IgG DCIS, 0/5 lymph nodes T3 N0 stage IIB;  Right mastectomy: DCIS low to high-grade multiple foci 5.5 cm 0/2 lymph nodes Tis N0 ------------------------------------------------------------------------------------------------------------------------------------------------------ Current treatment: Anastrozole 1 mg daily  Anastrozole toxicities: Mild fatigue Denies any hot flashes or myalgias Planned treatment duration is 5-10 years.  Major depression, anxiety disorder: Status post hospitalization and is under psychiatric care.  Intracerebral hemorrhage: No new symptoms related to this. Return to clinic in 1 year for follow-up    No orders of the defined types were placed in this encounter.   The patient has a good understanding of the overall plan. she agrees with it. she will call with any problems that may develop before the next visit here.   Rulon Eisenmenger, MD 03/25/2016

## 2016-03-25 NOTE — Telephone Encounter (Signed)
Gave and printed appt sched and avs for pt for June 2018 °

## 2016-03-25 NOTE — Assessment & Plan Note (Signed)
Right breast 5 x 6 x 3.8 cm lesion of non-masslike and nodular enhancement: Invasive ductal carcinoma with DCIS, grade 3, ER 100%; PR 63%,, HER-2 negative ratio 1.16, Ki-67 83% Right Breast LOQ: 01/30/15: DCIS involving the papillary lesion ER 51%, PR 45% Left breast 4.5x5x3.5 cm area of non-mass enhancement adjacent to complex sclerosing lesion extending 3.5 cm IDC with DCIS, ER/PR Neg Ki 67 13%; Her 2 Neg; Neo-adjuvant chemotherapy with Taxol and carboplatin weekly 4 started 02/20/2015 -03/13/2015 stopped due to psychiatric illness and hospitalization. Left mastectomy 05/04/15 : IDC grade 2/3; 5 cm with IgG DCIS, 0/5 lymph nodes T3 N0 stage IIB;  Right mastectomy: DCIS low to high-grade multiple foci 5.5 cm 0/2 lymph nodes Tis N0 ------------------------------------------------------------------------------------------------------------------------------------------------------ Current treatment: Anastrozole 1 mg daily  Anastrozole toxicities: Mild fatigue Denies any hot flashes or myalgias Planned treatment duration is 5-10 years.  Major depression, anxiety disorder: Status post hospitalization and is under psychiatric care.  Intracerebral hemorrhage: No new symptoms related to this. Return to clinic in 1 year for follow-up

## 2016-05-19 ENCOUNTER — Telehealth: Payer: Self-pay

## 2016-05-21 ENCOUNTER — Ambulatory Visit: Payer: Medicare Other | Admitting: Obstetrics

## 2016-05-26 NOTE — Telephone Encounter (Signed)
Left mess for pt. To call and reschedule. appt

## 2016-06-04 ENCOUNTER — Ambulatory Visit: Payer: Self-pay | Admitting: Obstetrics and Gynecology

## 2016-07-29 ENCOUNTER — Other Ambulatory Visit: Payer: Self-pay | Admitting: Hematology and Oncology

## 2016-07-29 DIAGNOSIS — C50311 Malignant neoplasm of lower-inner quadrant of right female breast: Secondary | ICD-10-CM

## 2016-08-13 ENCOUNTER — Other Ambulatory Visit: Payer: Self-pay | Admitting: Cardiovascular Disease

## 2016-08-13 DIAGNOSIS — R1011 Right upper quadrant pain: Secondary | ICD-10-CM

## 2016-08-22 ENCOUNTER — Other Ambulatory Visit: Payer: Self-pay

## 2016-08-26 ENCOUNTER — Telehealth: Payer: Self-pay | Admitting: Neurology

## 2016-08-26 NOTE — Telephone Encounter (Signed)
Pt called in after going through some paperwork in her home to let the office know the reason for her vertigo was probably due to medication. She states that during her visit no one ever asked about any of her medications. She says she did some research and a lot of her medications cause dizziness and no one even thought to look at that. At that time I advised the pt that it has been almost a year since she has been here and asked if during her visit she asked about her medications or called in later to talk about it. She said no, I have a paper here that says to call in for any suggestions or complaints. I told the pt that I would send this to the physician to let him know. She expressed understanding and said she did not want a call back.

## 2016-08-26 NOTE — Telephone Encounter (Signed)
The patient presented with a 35 year history of intermittent vertigo. I would doubt that this is related to medications.

## 2016-08-27 ENCOUNTER — Ambulatory Visit
Admission: RE | Admit: 2016-08-27 | Discharge: 2016-08-27 | Disposition: A | Discharge status: Home / Self Care | Payer: Medicare Other | Source: Ambulatory Visit | Attending: Cardiovascular Disease | Admitting: Cardiovascular Disease

## 2016-08-27 DIAGNOSIS — R1011 Right upper quadrant pain: Secondary | ICD-10-CM

## 2017-03-31 ENCOUNTER — Encounter: Payer: Self-pay | Admitting: Hematology and Oncology

## 2017-03-31 ENCOUNTER — Ambulatory Visit (HOSPITAL_BASED_OUTPATIENT_CLINIC_OR_DEPARTMENT_OTHER): Payer: Medicare Other | Admitting: Hematology and Oncology

## 2017-03-31 DIAGNOSIS — F329 Major depressive disorder, single episode, unspecified: Secondary | ICD-10-CM | POA: Diagnosis not present

## 2017-03-31 DIAGNOSIS — C50311 Malignant neoplasm of lower-inner quadrant of right female breast: Secondary | ICD-10-CM

## 2017-03-31 DIAGNOSIS — F419 Anxiety disorder, unspecified: Secondary | ICD-10-CM

## 2017-03-31 DIAGNOSIS — Z170 Estrogen receptor positive status [ER+]: Secondary | ICD-10-CM

## 2017-03-31 NOTE — Assessment & Plan Note (Signed)
Right breast 5 x 6 x 3.8 cm lesion of non-masslike and nodular enhancement: Invasive ductal carcinoma with DCIS, grade 3, ER 100%; PR 63%,, HER-2 negative ratio 1.16, Ki-67 83% Right Breast LOQ: 01/30/15: DCIS involving the papillary lesion ER 51%, PR 45% Left breast 4.5x5x3.5 cm area of non-mass enhancement adjacent to complex sclerosing lesion extending 3.5 cm IDC with DCIS, ER/PR Neg Ki 67 13%; Her 2 Neg; Neo-adjuvant chemotherapy with Taxol and carboplatin weekly 4 started 02/20/2015 -03/13/2015 stopped due to psychiatric illness and hospitalization. Left mastectomy 05/04/15 : IDC grade 2/3; 5 cm with IgG DCIS, 0/5 lymph nodes T3 N0 stage IIB;  Right mastectomy: DCIS low to high-grade multiple foci 5.5 cm 0/2 lymph nodes Tis N0 ------------------------------------------------------------------------------------------------------------------------------------------------------ Current treatment: Anastrozole 1 mg daily  Anastrozole toxicities: Mild fatigue Denies any hot flashes or myalgias Planned treatment duration is 5-10 years.  Major depression, anxiety disorder: Status post hospitalization and is under psychiatric care.  Intracerebral hemorrhage: No new symptoms related to this.  Surveillance: No palpable lumps or nodules. No role of imaging because she had bilateral mastectomies. Return to clinic in 1 year for follow-up

## 2017-03-31 NOTE — Progress Notes (Signed)
Patient Care Team: Dixie Dials, MD as PCP - General (Cardiology) [name], MD as Consulting Physician (General Surgery) Nicholas Lose, MD as Consulting Physician (Hematology and Oncology) Arloa Koh, MD as Consulting Physician (Radiation Oncology) Rockwell Germany, RN as Registered Nurse Mauro Kaufmann, RN as Registered Nurse Holley Bouche, NP as Nurse Practitioner (Nurse Practitioner) Thornell Sartorius, MD as Consulting Physician (Otolaryngology)  DIAGNOSIS:  Encounter Diagnosis  Name Primary?  . Malignant neoplasm of lower-inner quadrant of right breast of female, estrogen receptor positive ([place])     SUMMARY OF ONCOLOGIC HISTORY:   Breast cancer of lower-inner quadrant of right female breast ([place])   01/10/2015 Imaging    Ultrasound breast: Right breast 4:00: 2.3 cm lesion, at 4:00 6 cm from nipple to 0.4 cm hypoechoic mass, left breast 1130; 2 cm from nipple 0.4 cm mass      01/12/2015 Initial Diagnosis    Right breast biopsy: Invasive ductal carcinoma with DCIS, grade 3, ER 0%, PR 0%, HER-2 negative ratio 1.16, Ki-67 83%; left breast biopsy complex sclerosing lesion      01/19/2015 Breast MRI    Right breast 5 x 6 x 3.8 cm lesion of non-masslike and nodular enhancement: Left breast 4.5x5x3.5 cm area of non-mass enhancement adjacent to complex sclerosing lesion extending 3.5 sinus lateral to biopsy, no lymph nodes      01/30/2015 Initial Biopsy    Left Breast Biopsy: IDC with DCIS, ER 100%; PR 63%, Ki 67 13%; Her 2 Neg; Right: LOQ ER 51%, PR 45%      02/20/2015 - 03/13/2015 Neo-Adjuvant Chemotherapy    Neoadjuvant Taxol and carboplatin weekly 4, stopped early because of psychiatric illness      03/15/2015 - 03/22/2015 Hospital Admission    Severe generalized anxiety disorder, severe depression, small bitemporal and left frontal subarachnoid hemorrhage from a fall in the hospital      05/04/2015 Surgery    Left mastectomy: IDC grade 2/3; 5 cm with DCIS, 0/5  lymph nodes ER/PR Pos, Her 2 Neg;T3 N0 stage IIB; right mastectomy: DCIS low to high-grade multiple foci 5.5 cm 0/2 lymph nodes Tis N0 ER/PR Neg      07/26/2015 -  Anti-estrogen oral therapy    Anastrozole 1 mg daily 5-10 years      03/25/2016 Survivorship    SCP completed and mailed to patient       CHIEF COMPLIANT: Follow-up on anastrozole therapy  INTERVAL HISTORY: Grace Paul is a 82 year old with above-mentioned history of right breast cancer who previously underwent neoadjuvant chemotherapy stopped early because of mental health issues who underwent left mastectomy following which she went on anastrozole therapy. Her mental health issues have improved to a certain degree. She is tolerating anastrozole fairly well.  REVIEW OF SYSTEMS:   Constitutional: Denies fevers, chills or abnormal weight loss Eyes: Denies blurriness of vision Ears, nose, mouth, throat, and face: Denies mucositis or sore throat Respiratory: Denies cough, dyspnea or wheezes Cardiovascular: Denies palpitation, chest discomfort Gastrointestinal:  Denies nausea, heartburn or change in bowel habits Skin: Denies abnormal skin rashes Lymphatics: Denies new lymphadenopathy or easy bruising Neurological:Denies numbness, tingling or new weaknesses Behavioral/Psych: patient appears to be less anxious and less depressed Extremities: No lower extremity edema Breast:  denies any pain or lumps or nodules in either breasts All other systems were reviewed with the patient and are negative.  I have reviewed the past medical history, past surgical history, social history and family history with the patient and  they are unchanged from previous note.  ALLERGIES:  is allergic to chlorpromazine; gentian violet; halcion  [triazolam]; remeron  [mirtazapine]; trifluoperazine; abilify [aripiprazole]; clarithromycin; cleocin [clindamycin hcl]; compazine [prochlorperazine edisylate]; epinephrine; hyoscyamine sulfate; and  trazodone.  MEDICATIONS:  Current Outpatient Prescriptions  Medication Sig Dispense Refill  . acetaminophen (TYLENOL) 325 MG tablet Take 650 mg by mouth every 6 (six) hours as needed.    Marland Kitchen amLODipine (NORVASC) 2.5 MG tablet Take 2.5 mg by mouth every morning.    Marland Kitchen anastrozole (ARIMIDEX) 1 MG tablet take 1 tablet by mouth once daily 90 tablet 3  . aspirin 81 MG tablet Take 81 mg by mouth daily.    Marland Kitchen docusate sodium (COLACE) 100 MG capsule Take 100 mg by mouth daily as needed for mild constipation.     . fluconazole (DIFLUCAN) 100 MG tablet Take 100 mg by mouth daily. Reported on 10/23/2015    . HYDROcodone-acetaminophen (NORCO/VICODIN) 5-325 MG tablet Take 1 tablet by mouth daily. 30 tablet 0  . lamoTRIgine (LAMICTAL) 100 MG tablet Take 100 mg by mouth daily.    Marland Kitchen LORazepam (ATIVAN) 1 MG tablet TAKE 1/2 TABLET BY MOUTH  3 TIMES DAILY AS NEEDED FOR ANXIETY  0  . meclizine (ANTIVERT) 25 MG tablet Take 1 tablet (25 mg total) by mouth 3 (three) times daily as needed. 30 tablet 0  . metoprolol succinate (TOPROL-XL) 25 MG 24 hr tablet Take 12.5 mg by mouth 2 (two) times daily.     Marland Kitchen Propylene Glycol (SYSTANE BALANCE) 0.6 % SOLN Place 1 drop into both eyes at bedtime.    . triamcinolone (KENALOG) 0.025 % ointment Apply 1 application topically 2 (two) times daily. 30 g 0  . vitamin B-12 (CYANOCOBALAMIN) 1000 MCG tablet Take 1,000 mcg by mouth daily.     No current facility-administered medications for this visit.     PHYSICAL EXAMINATION: ECOG PERFORMANCE STATUS: 1 - Symptomatic but completely ambulatory  Vitals:   03/31/17 1459  BP: (!) 152/85  Pulse: 83  Resp: 18  Temp: 98.2 F (36.8 C)   Filed Weights   03/31/17 1459  Weight: 118 lb 4.8 oz (53.7 kg)    GENERAL:alert, no distress and comfortable SKIN: skin color, texture, turgor are normal, no rashes or significant lesions EYES: normal, Conjunctiva are pink and non-injected, sclera clear OROPHARYNX:no exudate, no erythema and lips,  buccal mucosa, and tongue normal  NECK: supple, thyroid normal size, non-tender, without nodularity LYMPH:  no palpable lymphadenopathy in the cervical, axillary or inguinal LUNGS: clear to auscultation and percussion with normal breathing effort HEART: regular rate & rhythm and no murmurs and no lower extremity edema ABDOMEN:abdomen soft, non-tender and normal bowel sounds MUSCULOSKELETAL:no cyanosis of digits and no clubbing  NEURO: alert & oriented x 3 with fluent speech, no focal motor/sensory deficits EXTREMITIES: No lower extremity edema BREAST: No palpable masses or nodules in either right or left breasts. No palpable axillary supraclavicular or infraclavicular adenopathy no breast tenderness or nipple discharge. (exam performed in the presence of a chaperone)  LABORATORY DATA:  I have reviewed the data as listed   Chemistry      Component Value Date/Time   NA 140 06/24/2015 1247   NA 137 03/13/2015 0853   K 3.7 06/24/2015 1247   K 4.2 03/13/2015 0853   CL 107 06/24/2015 1247   CO2 26 06/24/2015 1247   CO2 23 03/13/2015 0853   BUN 10 06/24/2015 1247   BUN 10.7 03/13/2015 0853   CREATININE 0.75 06/24/2015  1247   CREATININE 0.8 03/13/2015 0853      Component Value Date/Time   CALCIUM 9.4 06/24/2015 1247   CALCIUM 9.3 03/13/2015 0853   ALKPHOS 71 05/28/2015 1613   ALKPHOS 67 03/13/2015 0853   AST 19 05/28/2015 1613   AST 18 03/13/2015 0853   ALT 17 05/28/2015 1613   ALT 18 03/13/2015 0853   BILITOT 0.5 05/28/2015 1613   BILITOT 0.33 03/13/2015 0853       Lab Results  Component Value Date   WBC 8.9 06/24/2015   HGB 13.9 06/24/2015   HCT 41.2 06/24/2015   MCV 90.7 06/24/2015   PLT 210 06/24/2015   NEUTROABS 7.0 05/02/2015    ASSESSMENT & PLAN:  Breast cancer of lower-inner quadrant of right female breast Right breast 5 x 6 x 3.8 cm lesion of non-masslike and nodular enhancement: Invasive ductal carcinoma with DCIS, grade 3, ER 100%; PR 63%,, HER-2 negative  ratio 1.16, Ki-67 83% Right Breast LOQ: 01/30/15: DCIS involving the papillary lesion ER 51%, PR 45% Left breast 4.5x5x3.5 cm area of non-mass enhancement adjacent to complex sclerosing lesion extending 3.5 cm IDC with DCIS, ER/PR Neg Ki 67 13%; Her 2 Neg; Neo-adjuvant chemotherapy with Taxol and carboplatin weekly 4 started 02/20/2015 -03/13/2015 stopped due to psychiatric illness and hospitalization. Left mastectomy 05/04/15 : IDC grade 2/3; 5 cm with IgG DCIS, 0/5 lymph nodes T3 N0 stage IIB;  Right mastectomy: DCIS low to high-grade multiple foci 5.5 cm 0/2 lymph nodes Tis N0 ------------------------------------------------------------------------------------------------------------------------------------------------------ Current treatment: Anastrozole 1 mg daily Started 07/26/2015 Anastrozole toxicities: Mild fatigue Denies any hot flashes or myalgias Planned treatment duration is 5-10 years.  Major depression, anxiety disorder: Under psychiatric care. H/O Intracerebral hemorrhage: No new symptoms related to this.  Surveillance: No palpable lumps or nodules. No role of imaging because she had bilateral mastectomies. Return to clinic in 1 year for follow-up  I spent 25 minutes talking to the patient of which more than half was spent in counseling and coordination of care.  No orders of the defined types were placed in this encounter.  The patient has a good understanding of the overall plan. she agrees with it. she will call with any problems that may develop before the next visit here.   Rulon Eisenmenger, MD 03/31/17

## 2017-04-01 ENCOUNTER — Encounter: Payer: Self-pay | Admitting: *Deleted

## 2017-05-21 ENCOUNTER — Telehealth: Payer: Self-pay | Admitting: *Deleted

## 2017-05-21 ENCOUNTER — Other Ambulatory Visit: Payer: Self-pay | Admitting: *Deleted

## 2017-05-21 DIAGNOSIS — M899 Disorder of bone, unspecified: Secondary | ICD-10-CM

## 2017-05-21 NOTE — Telephone Encounter (Signed)
Received call from patient stating she feels what seems to be a cyst on her left rib.  She denies any pain.  She went to her PCP and they also felt this to be a cyst but was instructed to follow up with Dr. Lindi Adie.  Per Dr. Lindi Adie we will order an u/s of this area.  Appointment made and confirmed with patient for 05/26/17 at 12pm.

## 2017-05-26 ENCOUNTER — Ambulatory Visit (HOSPITAL_COMMUNITY)
Admission: RE | Admit: 2017-05-26 | Discharge: 2017-05-26 | Disposition: A | Discharge status: Home / Self Care | Payer: Medicare Other | Source: Ambulatory Visit | Attending: Hematology and Oncology | Admitting: Hematology and Oncology

## 2017-05-26 DIAGNOSIS — M899 Disorder of bone, unspecified: Secondary | ICD-10-CM | POA: Insufficient documentation

## 2017-05-26 DIAGNOSIS — Z853 Personal history of malignant neoplasm of breast: Secondary | ICD-10-CM | POA: Insufficient documentation

## 2017-05-28 ENCOUNTER — Telehealth: Payer: Self-pay | Admitting: *Deleted

## 2017-05-28 NOTE — Telephone Encounter (Signed)
Error

## 2017-08-03 ENCOUNTER — Other Ambulatory Visit: Payer: Self-pay | Admitting: *Deleted

## 2017-08-03 DIAGNOSIS — Z170 Estrogen receptor positive status [ER+]: Principal | ICD-10-CM

## 2017-08-03 DIAGNOSIS — C50311 Malignant neoplasm of lower-inner quadrant of right female breast: Secondary | ICD-10-CM

## 2017-08-03 MED ORDER — ANASTROZOLE 1 MG PO TABS: 1.0000 mg | ORAL_TABLET | Freq: Every day | ORAL | 3 refills | Status: DC

## 2018-02-08 ENCOUNTER — Telehealth: Payer: Self-pay | Admitting: Hematology and Oncology

## 2018-02-08 NOTE — Telephone Encounter (Signed)
Spoke to patient regarding upcoming June appointments per 4/19 sch message

## 2018-03-31 ENCOUNTER — Inpatient Hospital Stay: Payer: Medicare Other | Attending: Hematology and Oncology | Admitting: Hematology and Oncology

## 2018-03-31 ENCOUNTER — Telehealth: Payer: Self-pay | Admitting: Hematology and Oncology

## 2018-03-31 DIAGNOSIS — Z170 Estrogen receptor positive status [ER+]: Secondary | ICD-10-CM | POA: Diagnosis not present

## 2018-03-31 DIAGNOSIS — F329 Major depressive disorder, single episode, unspecified: Secondary | ICD-10-CM | POA: Diagnosis not present

## 2018-03-31 DIAGNOSIS — C50311 Malignant neoplasm of lower-inner quadrant of right female breast: Secondary | ICD-10-CM | POA: Diagnosis present

## 2018-03-31 DIAGNOSIS — K5900 Constipation, unspecified: Secondary | ICD-10-CM

## 2018-03-31 DIAGNOSIS — F419 Anxiety disorder, unspecified: Secondary | ICD-10-CM | POA: Diagnosis not present

## 2018-03-31 DIAGNOSIS — Z79811 Long term (current) use of aromatase inhibitors: Secondary | ICD-10-CM | POA: Diagnosis not present

## 2018-03-31 DIAGNOSIS — C50912 Malignant neoplasm of unspecified site of left female breast: Secondary | ICD-10-CM | POA: Insufficient documentation

## 2018-03-31 MED ORDER — ANASTROZOLE 1 MG PO TABS: 1.0000 mg | ORAL_TABLET | Freq: Every day | ORAL | 3 refills | Status: DC

## 2018-03-31 NOTE — Telephone Encounter (Signed)
Gave patient avs and calendar of upcoming June 2020 appointments.  °

## 2018-03-31 NOTE — Progress Notes (Signed)
Patient Care Team: Dixie Dials, MD as PCP - General (Cardiology) [name], MD as Consulting Physician (General Surgery) Nicholas Lose, MD as Consulting Physician (Hematology and Oncology) Arloa Koh, MD as Consulting Physician (Radiation Oncology) Rockwell Germany, RN as Registered Nurse Mauro Kaufmann, RN as Registered Nurse Holley Bouche, NP as Nurse Practitioner (Nurse Practitioner) Thornell Sartorius, MD as Consulting Physician (Otolaryngology)  DIAGNOSIS:  Encounter Diagnosis  Name Primary?  . Malignant neoplasm of lower-inner quadrant of right breast of female, estrogen receptor positive ([place])     SUMMARY OF ONCOLOGIC HISTORY:   Breast cancer of lower-inner quadrant of right female breast ([place])   01/10/2015 Imaging    Ultrasound breast: Right breast 4:00: 2.3 cm lesion, at 4:00 6 cm from nipple to 0.4 cm hypoechoic mass, left breast 1130; 2 cm from nipple 0.4 cm mass      01/12/2015 Initial Diagnosis    Right breast biopsy: Invasive ductal carcinoma with DCIS, grade 3, ER 0%, PR 0%, HER-2 negative ratio 1.16, Ki-67 83%; left breast biopsy complex sclerosing lesion      01/19/2015 Breast MRI    Right breast 5 x 6 x 3.8 cm lesion of non-masslike and nodular enhancement: Left breast 4.5x5x3.5 cm area of non-mass enhancement adjacent to complex sclerosing lesion extending 3.5 sinus lateral to biopsy, no lymph nodes      01/30/2015 Initial Biopsy    Left Breast Biopsy: IDC with DCIS, ER 100%; PR 63%, Ki 67 13%; Her 2 Neg; Right: LOQ ER 51%, PR 45%      02/20/2015 - 03/13/2015 Neo-Adjuvant Chemotherapy    Neoadjuvant Taxol and carboplatin weekly 4, stopped early because of psychiatric illness      03/15/2015 - 03/22/2015 Hospital Admission    Severe generalized anxiety disorder, severe depression, small bitemporal and left frontal subarachnoid hemorrhage from a fall in the hospital      05/04/2015 Surgery    Left mastectomy: IDC grade 2/3; 5 cm with DCIS, 0/5  lymph nodes ER/PR Pos, Her 2 Neg;T3 N0 stage IIB; right mastectomy: DCIS low to high-grade multiple foci 5.5 cm 0/2 lymph nodes Tis N0 ER/PR Neg      07/26/2015 -  Anti-estrogen oral therapy    Anastrozole 1 mg daily 5-10 years      03/25/2016 Survivorship    SCP completed and mailed to patient       CHIEF COMPLIANT: Follow-up on anastrozole therapy  INTERVAL HISTORY: Grace Paul is a 83 year old with above-mentioned history of right breast cancer underwent neoadjuvant chemotherapy followed by bilateral mastectomies and is currently on anastrozole therapy.  She is tolerating anastrozole extremely well apart from mild constipation.  She denies any lumps or nodules in the chest wall.  She is doing better emotionally and psychologically.    REVIEW OF SYSTEMS:   Constitutional: Denies fevers, chills or abnormal weight loss Eyes: Denies blurriness of vision Ears, nose, mouth, throat, and face: Denies mucositis or sore throat Respiratory: Denies cough, dyspnea or wheezes Cardiovascular: Denies palpitation, chest discomfort Gastrointestinal:  Denies nausea, heartburn or change in bowel habits Skin: Denies abnormal skin rashes Lymphatics: Denies new lymphadenopathy or easy bruising Neurological:Denies numbness, tingling or new weaknesses Behavioral/Psych: Depression and psychiatric illness Extremities: No lower extremity edema Breast: Bilateral mastectomies All other systems were reviewed with the patient and are negative.  I have reviewed the past medical history, past surgical history, social history and family history with the patient and they are unchanged from previous note.  ALLERGIES:  is allergic to chlorpromazine; gentian violet; halcion  [triazolam]; remeron  [mirtazapine]; trifluoperazine; abilify [aripiprazole]; clarithromycin; cleocin [clindamycin hcl]; compazine [prochlorperazine edisylate]; epinephrine; hyoscyamine sulfate; and trazodone.  MEDICATIONS:  Current Outpatient  Medications  Medication Sig Dispense Refill  . acetaminophen (TYLENOL) 325 MG tablet Take 650 mg by mouth every 6 (six) hours as needed.    Marland Kitchen amLODipine (NORVASC) 2.5 MG tablet Take 2.5 mg by mouth every morning.    Marland Kitchen anastrozole (ARIMIDEX) 1 MG tablet Take 1 tablet (1 mg total) by mouth daily. 90 tablet 3  . aspirin 81 MG tablet Take 81 mg by mouth daily.    Marland Kitchen docusate sodium (COLACE) 100 MG capsule Take 100 mg by mouth daily as needed for mild constipation.     . fluconazole (DIFLUCAN) 100 MG tablet Take 100 mg by mouth daily. Reported on 10/23/2015    . HYDROcodone-acetaminophen (NORCO/VICODIN) 5-325 MG tablet Take 1 tablet by mouth daily. 30 tablet 0  . lamoTRIgine (LAMICTAL) 100 MG tablet Take 100 mg by mouth daily.    Marland Kitchen LORazepam (ATIVAN) 1 MG tablet TAKE 1/2 TABLET BY MOUTH  3 TIMES DAILY AS NEEDED FOR ANXIETY  0  . meclizine (ANTIVERT) 25 MG tablet Take 1 tablet (25 mg total) by mouth 3 (three) times daily as needed. 30 tablet 0  . metoprolol succinate (TOPROL-XL) 25 MG 24 hr tablet Take 12.5 mg by mouth 2 (two) times daily.     Marland Kitchen Propylene Glycol (SYSTANE BALANCE) 0.6 % SOLN Place 1 drop into both eyes at bedtime.    . triamcinolone (KENALOG) 0.025 % ointment Apply 1 application topically 2 (two) times daily. 30 g 0  . vitamin B-12 (CYANOCOBALAMIN) 1000 MCG tablet Take 1,000 mcg by mouth daily.     No current facility-administered medications for this visit.     PHYSICAL EXAMINATION: ECOG PERFORMANCE STATUS: 1 - Symptomatic but completely ambulatory  Vitals:   03/31/18 1351  BP: (!) 160/87  Pulse: 79  Resp: 18  Temp: 98.6 F (37 C)  SpO2: 98%   Filed Weights   03/31/18 1351  Weight: 121 lb 12.8 oz (55.2 kg)    GENERAL:alert, no distress and comfortable SKIN: skin color, texture, turgor are normal, no rashes or significant lesions EYES: normal, Conjunctiva are pink and non-injected, sclera clear OROPHARYNX:no exudate, no erythema and lips, buccal mucosa, and tongue  normal  NECK: supple, thyroid normal size, non-tender, without nodularity LYMPH:  no palpable lymphadenopathy in the cervical, axillary or inguinal LUNGS: clear to auscultation and percussion with normal breathing effort HEART: regular rate & rhythm and no murmurs and no lower extremity edema ABDOMEN:abdomen soft, non-tender and normal bowel sounds MUSCULOSKELETAL:no cyanosis of digits and no clubbing  NEURO: alert & oriented x 3 with fluent speech, no focal motor/sensory deficits EXTREMITIES: No lower extremity edema BREAST: No palpable lumps or nodules in the chest wall or axilla (exam performed in the presence of a chaperone)  LABORATORY DATA:  I have reviewed the data as listed CMP Latest Ref Rng & Units 06/24/2015 05/28/2015 05/02/2015  Glucose 65 - 99 mg/dL 107(H) 111(H) 104(H)  BUN 6 - 20 mg/dL '10 12 18  '$ Creatinine 0.44 - 1.00 mg/dL 0.75 0.90 0.83  Sodium 135 - 145 mmol/L 140 137 139  Potassium 3.5 - 5.1 mmol/L 3.7 3.9 4.0  Chloride 101 - 111 mmol/L 107 103 105  CO2 22 - 32 mmol/L '26 26 25  '$ Calcium 8.9 - 10.3 mg/dL 9.4 9.5 9.5  Total Protein 6.5 - 8.1  g/dL - 7.2 6.5  Total Bilirubin 0.3 - 1.2 mg/dL - 0.5 0.4  Alkaline Phos 38 - 126 U/L - 71 74  AST 15 - 41 U/L - 19 20  ALT 14 - 54 U/L - 17 17    Lab Results  Component Value Date   WBC 8.9 06/24/2015   HGB 13.9 06/24/2015   HCT 41.2 06/24/2015   MCV 90.7 06/24/2015   PLT 210 06/24/2015   NEUTROABS 7.0 05/02/2015    ASSESSMENT & PLAN:  Breast cancer of lower-inner quadrant of right female breast Right breast5 x 6 x 3.8 cm lesion of non-masslike and nodular enhancement: Invasive ductal carcinoma with DCIS, grade 3, ER 100%; PR 63%,, HER-2 negative ratio 1.16, Ki-67 83% Right Breast LOQ: 01/30/15: DCIS involving the papillary lesion ER 51%, PR 45% Left breast4.5x5x3.5 cm area of non-mass enhancement adjacent to complex sclerosing lesion extending 3.5 cm IDC with DCIS, ER/PR Neg Ki 67 13%; Her 2 Neg; Neo-adjuvant  chemotherapy with Taxol and carboplatin weekly 4 started 02/20/2015 -03/13/2015 stopped due to psychiatric illness and hospitalization. Left mastectomy 05/04/15 : IDC grade 2/3; 5 cm with IgG DCIS, 0/5 lymph nodes T3 N0 stage IIB;  Right mastectomy: DCIS low to high-grade multiple foci 5.5 cm 0/2 lymph nodes Tis N0 ------------------------------------------------------------------------------------------------------------------------------------------------------ Current treatment: Anastrozole 1 mg daily Started 07/26/2015 Anastrozole toxicities: Mild fatigue Denies any hot flashes or myalgias Planned treatment duration is 7 years.  Major depression, anxiety disorder: Under psychiatric care. H/O Intracerebral hemorrhage: No new symptoms related to this.  Surveillance: No palpable lumps or nodules. No role of imaging because she had bilateral mastectomies. Return to clinic in 1 year for follow-up    No orders of the defined types were placed in this encounter.  The patient has a good understanding of the overall plan. she agrees with it. she will call with any problems that may develop before the next visit here.   Harriette Ohara, MD 03/31/18

## 2018-03-31 NOTE — Assessment & Plan Note (Signed)
Right breast5 x 6 x 3.8 cm lesion of non-masslike and nodular enhancement: Invasive ductal carcinoma with DCIS, grade 3, ER 100%; PR 63%,, HER-2 negative ratio 1.16, Ki-67 83% Right Breast LOQ: 01/30/15: DCIS involving the papillary lesion ER 51%, PR 45% Left breast4.5x5x3.5 cm area of non-mass enhancement adjacent to complex sclerosing lesion extending 3.5 cm IDC with DCIS, ER/PR Neg Ki 67 13%; Her 2 Neg; Neo-adjuvant chemotherapy with Taxol and carboplatin weekly 4 started 02/20/2015 -03/13/2015 stopped due to psychiatric illness and hospitalization. Left mastectomy 05/04/15 : IDC grade 2/3; 5 cm with IgG DCIS, 0/5 lymph nodes T3 N0 stage IIB;  Right mastectomy: DCIS low to high-grade multiple foci 5.5 cm 0/2 lymph nodes Tis N0 ------------------------------------------------------------------------------------------------------------------------------------------------------ Current treatment: Anastrozole 1 mg daily Started 07/26/2015 Anastrozole toxicities: Mild fatigue Denies any hot flashes or myalgias Planned treatment duration is 7 years.  Major depression, anxiety disorder: Under psychiatric care. H/O Intracerebral hemorrhage: No new symptoms related to this.  Surveillance: No palpable lumps or nodules. No role of imaging because she had bilateral mastectomies. Return to clinic in 1 year for follow-up

## 2018-03-31 NOTE — Addendum Note (Signed)
Addended by: Thelma Barge MAY J on: 03/31/2018 04:56 PM   Modules accepted: Orders

## 2018-05-07 ENCOUNTER — Other Ambulatory Visit: Payer: Self-pay | Admitting: Cardiovascular Disease

## 2018-05-07 DIAGNOSIS — R072 Precordial pain: Secondary | ICD-10-CM

## 2018-05-14 ENCOUNTER — Encounter (HOSPITAL_COMMUNITY): Payer: Medicare Other

## 2018-07-29 ENCOUNTER — Other Ambulatory Visit: Payer: Self-pay | Admitting: *Deleted

## 2018-07-29 DIAGNOSIS — C50311 Malignant neoplasm of lower-inner quadrant of right female breast: Secondary | ICD-10-CM

## 2018-07-29 DIAGNOSIS — Z170 Estrogen receptor positive status [ER+]: Principal | ICD-10-CM

## 2018-07-29 MED ORDER — ANASTROZOLE 1 MG PO TABS: 1.0000 mg | ORAL_TABLET | Freq: Every day | ORAL | 3 refills | Status: DC

## 2018-08-06 ENCOUNTER — Other Ambulatory Visit: Payer: Self-pay | Admitting: Hematology and Oncology

## 2018-08-06 DIAGNOSIS — C50311 Malignant neoplasm of lower-inner quadrant of right female breast: Secondary | ICD-10-CM

## 2018-08-06 DIAGNOSIS — Z170 Estrogen receptor positive status [ER+]: Principal | ICD-10-CM

## 2018-09-24 ENCOUNTER — Telehealth: Payer: Self-pay

## 2018-09-24 NOTE — Telephone Encounter (Signed)
Pt calling because she has a new lump felt on her leg. Very poor historian and needs to see Dr.Gudena. Advised that pt see her PCP right away. Pt states that her dr will be in [place] for the rest of the month and will not be seeing pt until January. Pt stated that she would like to see Dr.Gudena to get her lump evaluated soon. Pt states that she has severe history of anxiety and need to make sure the lump on her leg is nothing to worry about. Pt also needing refill on her pain medication. Told pt that she can discuss with Dr.Gudena during her visit. Pt verbalized understanding.

## 2018-10-04 ENCOUNTER — Inpatient Hospital Stay: Payer: Medicare Other | Attending: Hematology and Oncology | Admitting: Hematology and Oncology

## 2018-10-04 DIAGNOSIS — C50912 Malignant neoplasm of unspecified site of left female breast: Secondary | ICD-10-CM | POA: Diagnosis not present

## 2018-10-04 DIAGNOSIS — F329 Major depressive disorder, single episode, unspecified: Secondary | ICD-10-CM

## 2018-10-04 DIAGNOSIS — Z79811 Long term (current) use of aromatase inhibitors: Secondary | ICD-10-CM | POA: Diagnosis not present

## 2018-10-04 DIAGNOSIS — Z170 Estrogen receptor positive status [ER+]: Secondary | ICD-10-CM | POA: Diagnosis not present

## 2018-10-04 DIAGNOSIS — C50311 Malignant neoplasm of lower-inner quadrant of right female breast: Secondary | ICD-10-CM | POA: Diagnosis present

## 2018-10-04 MED ORDER — NEOMYCIN-POLYMYXIN-HC 1 % OT SOLN: 3.0000 [drp] | OTIC | 1 refills | Status: DC | PRN

## 2018-10-04 MED ORDER — ANASTROZOLE 1 MG PO TABS: 1.0000 mg | ORAL_TABLET | Freq: Every day | ORAL | 3 refills | Status: DC

## 2018-10-04 NOTE — Progress Notes (Signed)
Patient Care Team: Dixie Dials, MD as PCP - General (Cardiology) [name], MD as Consulting Physician (General Surgery) Nicholas Lose, MD as Consulting Physician (Hematology and Oncology) Arloa Koh, MD as Consulting Physician (Radiation Oncology) Rockwell [place], RN as Registered Nurse Mauro Kaufmann, RN as Registered Nurse Holley Bouche, NP (Inactive) as Nurse Practitioner (Nurse Practitioner) Thornell Sartorius, MD as Consulting Physician (Otolaryngology)  DIAGNOSIS:  Encounter Diagnosis  Name Primary?  . Malignant neoplasm of lower-inner quadrant of right breast of female, estrogen receptor positive ([place])     SUMMARY OF ONCOLOGIC HISTORY:   Breast cancer of lower-inner quadrant of right female breast ([place])   01/10/2015 Imaging    Ultrasound breast: Right breast 4:00: 2.3 cm lesion, at 4:00 6 cm from nipple to 0.4 cm hypoechoic mass, left breast 1130; 2 cm from nipple 0.4 cm mass    01/12/2015 Initial Diagnosis    Right breast biopsy: Invasive ductal carcinoma with DCIS, grade 3, ER 0%, PR 0%, HER-2 negative ratio 1.16, Ki-67 83%; left breast biopsy complex sclerosing lesion    01/19/2015 Breast MRI    Right breast 5 x 6 x 3.8 cm lesion of non-masslike and nodular enhancement: Left breast 4.5x5x3.5 cm area of non-mass enhancement adjacent to complex sclerosing lesion extending 3.5 sinus lateral to biopsy, no lymph nodes    01/30/2015 Initial Biopsy    Left Breast Biopsy: IDC with DCIS, ER 100%; PR 63%, Ki 67 13%; Her 2 Neg; Right: LOQ ER 51%, PR 45%    02/20/2015 - 03/13/2015 Neo-Adjuvant Chemotherapy    Neoadjuvant Taxol and carboplatin weekly 4, stopped early because of psychiatric illness    03/15/2015 - 03/22/2015 Hospital Admission    Severe generalized anxiety disorder, severe depression, small bitemporal and left frontal subarachnoid hemorrhage from a fall in the hospital    05/04/2015 Surgery    Left mastectomy: IDC grade 2/3; 5 cm with DCIS, 0/5 lymph  nodes ER/PR Pos, Her 2 Neg;T3 N0 stage IIB; right mastectomy: DCIS low to high-grade multiple foci 5.5 cm 0/2 lymph nodes Tis N0 ER/PR Neg    07/26/2015 -  Anti-estrogen oral therapy    Anastrozole 1 mg daily 5-10 years    03/25/2016 Survivorship    SCP completed and mailed to patient     CHIEF COMPLIANT: Follow-up to discuss her social emotional issues as well as medication issues.  INTERVAL HISTORY: Grace Paul is a 83 year old with above-mentioned history of breast cancer who completed therapy and is currently on antiestrogen therapy with anastrozole.  She is tolerating anastrozole extremely well.  She does not have any hot flashes.  Her depression symptoms appear to be significantly better.  She called as complaining of her medications.  She was requesting narcotic pain medication which is being prescribed by Dr. Dwyane Dee.  She was also asking as to refill her ear medication prescription.  REVIEW OF SYSTEMS:   Constitutional: Denies fevers, chills or abnormal weight loss Eyes: Denies blurriness of vision Ears, nose, mouth, throat, and face: Frequent ear infections Respiratory: Denies cough, dyspnea or wheezes Cardiovascular: Denies palpitation, chest discomfort Gastrointestinal:  Denies nausea, heartburn or change in bowel habits Skin: Denies abnormal skin rashes Lymphatics: Denies new lymphadenopathy or easy bruising Neurological:Denies numbness, tingling or new weaknesses Behavioral/Psych: Anxiety and depression Extremities: No lower extremity edema   All other systems were reviewed with the patient and are negative.  I have reviewed the past medical history, past surgical history, social history and family history with the  patient and they are unchanged from previous note.  ALLERGIES:  is allergic to chlorpromazine; gentian violet; halcion  [triazolam]; remeron  [mirtazapine]; trifluoperazine; abilify [aripiprazole]; clarithromycin; cleocin [clindamycin hcl]; compazine  [prochlorperazine edisylate]; epinephrine; hyoscyamine sulfate; and trazodone.  MEDICATIONS:  Current Outpatient Medications  Medication Sig Dispense Refill  . acetaminophen (TYLENOL) 325 MG tablet Take 650 mg by mouth every 6 (six) hours as needed.    Marland Kitchen amLODipine (NORVASC) 2.5 MG tablet Take 2.5 mg by mouth every morning.    Marland Kitchen anastrozole (ARIMIDEX) 1 MG tablet Take 1 tablet (1 mg total) by mouth daily. 90 tablet 3  . aspirin 81 MG tablet Take 81 mg by mouth daily.    Marland Kitchen HYDROcodone-acetaminophen (NORCO/VICODIN) 5-325 MG tablet Take 1 tablet by mouth daily. 30 tablet 0  . meclizine (ANTIVERT) 25 MG tablet Take 1 tablet (25 mg total) by mouth 3 (three) times daily as needed. 30 tablet 0  . Melatonin 10 MG CAPS Take 1 tablet by mouth at bedtime.    . metoprolol succinate (TOPROL-XL) 25 MG 24 hr tablet Take 12.5 mg by mouth 2 (two) times daily.     . NEOMYCIN-POLYMYXIN-HYDROCORTISONE (CORTISPORIN) 1 % SOLN OTIC solution Place 3 drops into both ears as needed. 10 mL 1  . Propylene Glycol (SYSTANE BALANCE) 0.6 % SOLN Place 1 drop into both eyes at bedtime.    . vitamin B-12 (CYANOCOBALAMIN) 1000 MCG tablet Take 1,000 mcg by mouth daily.     No current facility-administered medications for this visit.     PHYSICAL EXAMINATION: ECOG PERFORMANCE STATUS: 1 - Symptomatic but completely ambulatory  Vitals:   10/04/18 1333  BP: (!) 151/87  Pulse: 98  Resp: 17  Temp: 98 F (36.7 C)  SpO2: 98%   Filed Weights   10/04/18 1333  Weight: 121 lb 3.2 oz (55 kg)    GENERAL:alert, no distress and comfortable SKIN: skin color, texture, turgor are normal, no rashes or significant lesions EYES: normal, Conjunctiva are pink and non-injected, sclera clear OROPHARYNX:no exudate, no erythema and lips, buccal mucosa, and tongue normal  NECK: supple, thyroid normal size, non-tender, without nodularity LYMPH:  no palpable lymphadenopathy in the cervical, axillary or inguinal LUNGS: clear to auscultation  and percussion with normal breathing effort HEART: regular rate & rhythm and no murmurs and no lower extremity edema ABDOMEN:abdomen soft, non-tender and normal bowel sounds MUSCULOSKELETAL:no cyanosis of digits and no clubbing  NEURO: alert & oriented x 3 with fluent speech, no focal motor/sensory deficits EXTREMITIES: No lower extremity edema   LABORATORY DATA:  I have reviewed the data as listed CMP Latest Ref Rng & Units 06/24/2015 05/28/2015 05/02/2015  Glucose 65 - 99 mg/dL 107(H) 111(H) 104(H)  BUN 6 - 20 mg/dL '10 12 18  '$ Creatinine 0.44 - 1.00 mg/dL 0.75 0.90 0.83  Sodium 135 - 145 mmol/L 140 137 139  Potassium 3.5 - 5.1 mmol/L 3.7 3.9 4.0  Chloride 101 - 111 mmol/L 107 103 105  CO2 22 - 32 mmol/L '26 26 25  '$ Calcium 8.9 - 10.3 mg/dL 9.4 9.5 9.5  Total Protein 6.5 - 8.1 g/dL - 7.2 6.5  Total Bilirubin 0.3 - 1.2 mg/dL - 0.5 0.4  Alkaline Phos 38 - 126 U/L - 71 74  AST 15 - 41 U/L - 19 20  ALT 14 - 54 U/L - 17 17    Lab Results  Component Value Date   WBC 8.9 06/24/2015   HGB 13.9 06/24/2015   HCT 41.2 06/24/2015  MCV 90.7 06/24/2015   PLT 210 06/24/2015   NEUTROABS 7.0 05/02/2015    ASSESSMENT & PLAN:  Breast cancer of lower-inner quadrant of right female breast Right breast5 x 6 x 3.8 cm lesion of non-masslike and nodular enhancement: Invasive ductal carcinoma with DCIS, grade 3, ER 100%; PR 63%,, HER-2 negative ratio 1.16, Ki-67 83% Right Breast LOQ: 01/30/15: DCIS involving the papillary lesion ER 51%, PR 45% Left breast4.5x5x3.5 cm area of non-mass enhancement adjacent to complex sclerosing lesion extending 3.5 cm IDC with DCIS, ER/PR Neg Ki 67 13%; Her 2 Neg; Neo-adjuvant chemotherapy with Taxol and carboplatin weekly 4 started 02/20/2015 -03/13/2015 stopped due to psychiatric illness and hospitalization. Left mastectomy 05/04/15 : IDC grade 2/3; 5 cm with IgG DCIS, 0/5 lymph nodes T3 N0 stage IIB;  Right mastectomy: DCIS low to high-grade multiple foci 5.5 cm 0/2  lymph nodes Tis N0 ------------------------------------------------------------------------------------------------------------------------------------------------------ Current treatment: Anastrozole 1 mg dailyStarted 07/26/2015 Anastrozole toxicities: Mild fatigue  Major depression, anxiety disorder:Under psychiatric care. H/OIntracerebral hemorrhage: No new symptoms related to this.  Patient came in today primarily to discuss her feelings and her concerns related to her medications. She would like to take a medicine called KAVA and collagen.  These are plant-based supplements.   Surveillance:   No role of imaging because she had bilateral mastectomies. Return to clinic in June for follow-up    No orders of the defined types were placed in this encounter.  The patient has a good understanding of the overall plan. she agrees with it. she will call with any problems that may develop before the next visit here.   Harriette Ohara, MD 10/04/18

## 2018-10-04 NOTE — Assessment & Plan Note (Addendum)
Right breast5 x 6 x 3.8 cm lesion of non-masslike and nodular enhancement: Invasive ductal carcinoma with DCIS, grade 3, ER 100%; PR 63%,, HER-2 negative ratio 1.16, Ki-67 83% Right Breast LOQ: 01/30/15: DCIS involving the papillary lesion ER 51%, PR 45% Left breast4.5x5x3.5 cm area of non-mass enhancement adjacent to complex sclerosing lesion extending 3.5 cm IDC with DCIS, ER/PR Neg Ki 67 13%; Her 2 Neg; Neo-adjuvant chemotherapy with Taxol and carboplatin weekly 4 started 02/20/2015 -03/13/2015 stopped due to psychiatric illness and hospitalization. Left mastectomy 05/04/15 : IDC grade 2/3; 5 cm with IgG DCIS, 0/5 lymph nodes T3 N0 stage IIB;  Right mastectomy: DCIS low to high-grade multiple foci 5.5 cm 0/2 lymph nodes Tis N0 ------------------------------------------------------------------------------------------------------------------------------------------------------ Current treatment: Anastrozole 1 mg dailyStarted 07/26/2015 Anastrozole toxicities: Mild fatigue  Major depression, anxiety disorder:Under psychiatric care. H/OIntracerebral hemorrhage: No new symptoms related to this.  Patient came in today primarily to discuss her feelings and her concerns related to her medications. She would like to take a medicine called KAVA and collagen.  These are plant-based supplements.   Surveillance:   No role of imaging because she had bilateral mastectomies. Return to clinic in 1 year for follow-up

## 2018-10-05 ENCOUNTER — Telehealth: Payer: Self-pay | Admitting: Hematology and Oncology

## 2018-10-05 NOTE — Telephone Encounter (Signed)
Per 12/16 no los °

## 2018-10-05 NOTE — Telephone Encounter (Signed)
No 12/16 los or referrals.

## 2019-03-22 ENCOUNTER — Telehealth: Payer: Self-pay | Admitting: Hematology and Oncology

## 2019-03-22 NOTE — Telephone Encounter (Signed)
Talk with patient and she really did not want to come in and doesn't have video capability

## 2019-03-22 NOTE — Assessment & Plan Note (Signed)
Right breast5 x 6 x 3.8 cm lesion of non-masslike and nodular enhancement: Invasive ductal carcinoma with DCIS, grade 3, ER 100%; PR 63%,, HER-2 negative ratio 1.16, Ki-67 83% Right Breast LOQ: 01/30/15: DCIS involving the papillary lesion ER 51%, PR 45% Left breast4.5x5x3.5 cm area of non-mass enhancement adjacent to complex sclerosing lesion extending 3.5 cm IDC with DCIS, ER/PR Neg Ki 67 13%; Her 2 Neg; Neo-adjuvant chemotherapy with Taxol and carboplatin weekly 4 started 02/20/2015 -03/13/2015 stopped due to psychiatric illness and hospitalization. Left mastectomy 05/04/15 : IDC grade 2/3; 5 cm with IgG DCIS, 0/5 lymph nodes T3 N0 stage IIB;  Right mastectomy: DCIS low to high-grade multiple foci 5.5 cm 0/2 lymph nodes Tis N0 ------------------------------------------------------------------------------------------------------------------------------------------------------ Current treatment: Anastrozole 1 mg dailyStarted 07/26/2015 Anastrozole toxicities: Mild fatigue  Major depression, anxiety disorder:Under psychiatric care. H/OIntracerebral hemorrhage: No new symptoms related to this.  Patient came in today primarily to discuss her feelings and her concerns related to her medications. She would like to take a medicine called KAVA and collagen.  These are plant-based supplements.   Surveillance:   No role of imaging because she had bilateral mastectomies. Return to clinic in  6 months for follow-up

## 2019-03-29 NOTE — Progress Notes (Signed)
HEMATOLOGY-ONCOLOGY TELEPHONE VISIT PROGRESS NOTE  I connected with Grace Paul on 03/30/2019 at  2:00 PM EDT by telephone and verified that I am speaking with the correct person using two identifiers.  I discussed the limitations, risks, security and privacy concerns of performing an evaluation and management service by telephone and the availability of in person appointments.  I also discussed with the patient that there may be a patient responsible charge related to this service. The patient expressed understanding and agreed to proceed.   History of Present Illness: Grace Paul is a 84 y.o. female with above-mentioned history of right breast cancer treated with neoadjuvant chemotherapy, bilateral mastectomies, and is currently on anastrozole therapy. She is over the phone today for 6 month follow-up.     Breast cancer of lower-inner quadrant of right female breast ([place])   01/10/2015 Imaging    Ultrasound breast: Right breast 4:00: 2.3 cm lesion, at 4:00 6 cm from nipple to 0.4 cm hypoechoic mass, left breast 1130; 2 cm from nipple 0.4 cm mass    01/12/2015 Initial Diagnosis    Right breast biopsy: Invasive ductal carcinoma with DCIS, grade 3, ER 0%, PR 0%, HER-2 negative ratio 1.16, Ki-67 83%; left breast biopsy complex sclerosing lesion    01/19/2015 Breast MRI    Right breast 5 x 6 x 3.8 cm lesion of non-masslike and nodular enhancement: Left breast 4.5x5x3.5 cm area of non-mass enhancement adjacent to complex sclerosing lesion extending 3.5 sinus lateral to biopsy, no lymph nodes    01/30/2015 Initial Biopsy    Left Breast Biopsy: IDC with DCIS, ER 100%; PR 63%, Ki 67 13%; Her 2 Neg; Right: LOQ ER 51%, PR 45%    02/20/2015 - 03/13/2015 Neo-Adjuvant Chemotherapy    Neoadjuvant Taxol and carboplatin weekly 4, stopped early because of psychiatric illness    03/15/2015 - 03/22/2015 Hospital Admission    Severe generalized anxiety disorder, severe depression, small bitemporal and left frontal  subarachnoid hemorrhage from a fall in the hospital    05/04/2015 Surgery    Left mastectomy: IDC grade 2/3; 5 cm with DCIS, 0/5 lymph nodes ER/PR Pos, Her 2 Neg;T3 N0 stage IIB; right mastectomy: DCIS low to high-grade multiple foci 5.5 cm 0/2 lymph nodes Tis N0 ER/PR Neg    07/26/2015 -  Anti-estrogen oral therapy    Anastrozole 1 mg daily 5-10 years    03/25/2016 Survivorship    SCP completed and mailed to patient    Observations/Objective:     Assessment Plan:  Breast cancer of lower-inner quadrant of right female breast Right breast5 x 6 x 3.8 cm lesion of non-masslike and nodular enhancement: Invasive ductal carcinoma with DCIS, grade 3, ER 100%; PR 63%,, HER-2 negative ratio 1.16, Ki-67 83% Right Breast LOQ: 01/30/15: DCIS involving the papillary lesion ER 51%, PR 45% Left breast4.5x5x3.5 cm area of non-mass enhancement adjacent to complex sclerosing lesion extending 3.5 cm IDC with DCIS, ER/PR Neg Ki 67 13%; Her 2 Neg; Neo-adjuvant chemotherapy with Taxol and carboplatin weekly 4 started 02/20/2015 -03/13/2015 stopped due to psychiatric illness and hospitalization. Left mastectomy 05/04/15 : IDC grade 2/3; 5 cm with IgG DCIS, 0/5 lymph nodes T3 N0 stage IIB;  Right mastectomy: DCIS low to high-grade multiple foci 5.5 cm 0/2 lymph nodes Tis N0 ------------------------------------------------------------------------------------------------------------------------------------------------------ Current treatment: Anastrozole 1 mg dailyStarted 07/26/2015 Anastrozole toxicities: Mild fatigue Constipation  Major depression, anxiety disorder:Told me she stopped psychiatric care. H/OIntracerebral hemorrhage: No new symptoms related to this.  Surveillance:   No  role of imaging because she had bilateral mastectomies. Return to clinic in 1 year for follow-up  I discussed the assessment and treatment plan with the patient. The patient was provided an opportunity to ask questions and  all were answered. The patient agreed with the plan and demonstrated an understanding of the instructions. The patient was advised to call back or seek an in-person evaluation if the symptoms worsen or if the condition fails to improve as anticipated.   I provided 15 minutes of non-face-to-face time during this encounter.   Rulon Eisenmenger, MD 03/30/2019    I, Molly Dorshimer, am acting as scribe for Nicholas Lose, MD.  I have reviewed the above documentation for accuracy and completeness, and I agree with the above.

## 2019-03-30 ENCOUNTER — Telehealth: Payer: Self-pay | Admitting: *Deleted

## 2019-03-30 ENCOUNTER — Inpatient Hospital Stay: Payer: Medicare Other | Attending: Hematology and Oncology | Admitting: Hematology and Oncology

## 2019-03-30 DIAGNOSIS — Z9221 Personal history of antineoplastic chemotherapy: Secondary | ICD-10-CM | POA: Diagnosis not present

## 2019-03-30 DIAGNOSIS — Z170 Estrogen receptor positive status [ER+]: Secondary | ICD-10-CM | POA: Diagnosis not present

## 2019-03-30 DIAGNOSIS — C50311 Malignant neoplasm of lower-inner quadrant of right female breast: Secondary | ICD-10-CM

## 2019-03-30 DIAGNOSIS — Z9012 Acquired absence of left breast and nipple: Secondary | ICD-10-CM

## 2019-03-30 DIAGNOSIS — Z79811 Long term (current) use of aromatase inhibitors: Secondary | ICD-10-CM

## 2019-04-25 ENCOUNTER — Other Ambulatory Visit: Payer: Self-pay | Admitting: *Deleted

## 2019-04-25 DIAGNOSIS — Z170 Estrogen receptor positive status [ER+]: Secondary | ICD-10-CM

## 2019-04-25 DIAGNOSIS — C50311 Malignant neoplasm of lower-inner quadrant of right female breast: Secondary | ICD-10-CM

## 2019-04-25 MED ORDER — ANASTROZOLE 1 MG PO TABS: 1.0000 mg | ORAL_TABLET | Freq: Every day | ORAL | 3 refills | Status: DC

## 2019-07-22 ENCOUNTER — Other Ambulatory Visit: Payer: Self-pay | Admitting: *Deleted

## 2019-07-22 DIAGNOSIS — C50311 Malignant neoplasm of lower-inner quadrant of right female breast: Secondary | ICD-10-CM

## 2019-07-22 MED ORDER — ANASTROZOLE 1 MG PO TABS: 1.0000 mg | ORAL_TABLET | Freq: Every day | ORAL | 3 refills | Status: DC

## 2019-10-04 ENCOUNTER — Telehealth: Payer: Self-pay | Admitting: *Deleted

## 2019-10-04 NOTE — Telephone Encounter (Signed)
Received message from patient requesting a call back.  Attempted to call her back,left message for a return phone call.

## 2019-10-19 ENCOUNTER — Other Ambulatory Visit: Payer: Self-pay

## 2019-10-19 DIAGNOSIS — Z170 Estrogen receptor positive status [ER+]: Secondary | ICD-10-CM

## 2019-10-19 DIAGNOSIS — C50311 Malignant neoplasm of lower-inner quadrant of right female breast: Secondary | ICD-10-CM

## 2019-10-19 MED ORDER — ANASTROZOLE 1 MG PO TABS: 1.0000 mg | ORAL_TABLET | Freq: Every day | ORAL | 0 refills | Status: DC

## 2019-12-11 NOTE — Progress Notes (Signed)
Patient Care Team: Dixie Dials, MD as PCP - General (Cardiology) [name], MD as Consulting Physician (General Surgery) Nicholas Lose, MD as Consulting Physician (Hematology and Oncology) Arloa Koh, MD (Inactive) as Consulting Physician (Radiation Oncology) Rockwell Germany, RN as Registered Nurse Mauro Kaufmann, RN as Registered Nurse Holley Bouche, NP (Inactive) as Nurse Practitioner (Nurse Practitioner) Thornell Sartorius, MD as Consulting Physician (Otolaryngology)  DIAGNOSIS:    ICD-10-CM   1. Malignant neoplasm of lower-inner quadrant of right breast of female, estrogen receptor positive ([place])  C50.311    Z17.0     SUMMARY OF ONCOLOGIC HISTORY: Oncology History  Breast cancer of lower-inner quadrant of right female breast ([place])  01/10/2015 Imaging   Ultrasound breast: Right breast 4:00: 2.3 cm lesion, at 4:00 6 cm from nipple to 0.4 cm hypoechoic mass, left breast 1130; 2 cm from nipple 0.4 cm mass   01/12/2015 Initial Diagnosis   Right breast biopsy: Invasive ductal carcinoma with DCIS, grade 3, ER 0%, PR 0%, HER-2 negative ratio 1.16, Ki-67 83%; left breast biopsy complex sclerosing lesion   01/19/2015 Breast MRI   Right breast 5 x 6 x 3.8 cm lesion of non-masslike and nodular enhancement: Left breast 4.5x5x3.5 cm area of non-mass enhancement adjacent to complex sclerosing lesion extending 3.5 sinus lateral to biopsy, no lymph nodes   01/30/2015 Initial Biopsy   Left Breast Biopsy: IDC with DCIS, ER 100%; PR 63%, Ki 67 13%; Her 2 Neg; Right: LOQ ER 51%, PR 45%   02/20/2015 - 03/13/2015 Neo-Adjuvant Chemotherapy   Neoadjuvant Taxol and carboplatin weekly 4, stopped early because of psychiatric illness   03/15/2015 - 03/22/2015 Hospital Admission   Severe generalized anxiety disorder, severe depression, small bitemporal and left frontal subarachnoid hemorrhage from a fall in the hospital   05/04/2015 Surgery   Left mastectomy: IDC grade 2/3; 5 cm with DCIS, 0/5  lymph nodes ER/PR Pos, Her 2 Neg;T3 N0 stage IIB; right mastectomy: DCIS low to high-grade multiple foci 5.5 cm 0/2 lymph nodes Tis N0 ER/PR Neg   07/26/2015 -  Anti-estrogen oral therapy   Anastrozole 1 mg daily 5-10 years   03/25/2016 Survivorship   SCP completed and mailed to patient     CHIEF COMPLIANT: Follow-up of right breast cancer on anastrozole  INTERVAL HISTORY: Grace Paul is a 85 y.o. with above-mentioned history of right breast cancer treated with neoadjuvant chemotherapy, bilateral mastectomies, and who is currently on anastrozole therapy. She presents to the clinic today for 6 month follow-up.   ALLERGIES:  is allergic to chlorpromazine; gentian violet; halcion  [triazolam]; remeron  [mirtazapine]; trifluoperazine; abilify [aripiprazole]; clarithromycin; cleocin [clindamycin hcl]; compazine [prochlorperazine edisylate]; epinephrine; hyoscyamine sulfate; and trazodone.  MEDICATIONS:  Current Outpatient Medications  Medication Sig Dispense Refill  . acetaminophen (TYLENOL) 325 MG tablet Take 650 mg by mouth every 6 (six) hours as needed.    Marland Kitchen amLODipine (NORVASC) 2.5 MG tablet Take 2.5 mg by mouth every morning.    Marland Kitchen anastrozole (ARIMIDEX) 1 MG tablet Take 1 tablet (1 mg total) by mouth daily. 90 tablet 0  . aspirin 81 MG tablet Take 81 mg by mouth daily.    Marland Kitchen HYDROcodone-acetaminophen (NORCO/VICODIN) 5-325 MG tablet Take 1 tablet by mouth daily. 30 tablet 0  . meclizine (ANTIVERT) 25 MG tablet Take 1 tablet (25 mg total) by mouth 3 (three) times daily as needed. 30 tablet 0  . Melatonin 10 MG CAPS Take 1 tablet by mouth at bedtime.    Marland Kitchen  metoprolol succinate (TOPROL-XL) 25 MG 24 hr tablet Take 12.5 mg by mouth 2 (two) times daily.     . NEOMYCIN-POLYMYXIN-HYDROCORTISONE (CORTISPORIN) 1 % SOLN OTIC solution Place 3 drops into both ears as needed. 10 mL 1  . Propylene Glycol (SYSTANE BALANCE) 0.6 % SOLN Place 1 drop into both eyes at bedtime.    . vitamin B-12 (CYANOCOBALAMIN)  1000 MCG tablet Take 1,000 mcg by mouth daily.     No current facility-administered medications for this visit.    PHYSICAL EXAMINATION: ECOG PERFORMANCE STATUS: 1 - Symptomatic but completely ambulatory  Vitals:   12/12/19 1423  BP: (!) 153/85  Pulse: 92  Resp: 17  Temp: 98.2 F (36.8 C)  SpO2: 98%   Filed Weights   12/12/19 1423  Weight: 125 lb (56.7 kg)    BREAST: No palpable masses or nodules in either right or left breasts. No palpable axillary supraclavicular or infraclavicular adenopathy no breast tenderness or nipple discharge. (exam performed in the presence of a chaperone)  LABORATORY DATA:  I have reviewed the data as listed CMP Latest Ref Rng & Units 06/24/2015 05/28/2015 05/02/2015  Glucose 65 - 99 mg/dL 107(H) 111(H) 104(H)  BUN 6 - 20 mg/dL _0 Creatinine 0.44 - 1.00 mg/dL 0.75 0.90 0.83  Sodium 135 - 145 mmol/L 140 137 139  Potassium 3.5 - 5.1 mmol/L 3.7 3.9 4.0  Chloride 101 - 111 mmol/L 107 103 105  CO2 22 - 32 mmol/L _1 Calcium 8.9 - 10.3 mg/dL 9.4 9.5 9.5  Total Protein 6.5 - 8.1 g/dL - 7.2 6.5  Total Bilirubin 0.3 - 1.2 mg/dL - 0.5 0.4  Alkaline Phos 38 - 126 U/L - 71 74  AST 15 - 41 U/L - 19 20  ALT 14 - 54 U/L - 17 17    Lab Results  Component Value Date   WBC 8.9 06/24/2015   HGB 13.9 06/24/2015   HCT 41.2 06/24/2015   MCV 90.7 06/24/2015   PLT 210 06/24/2015   NEUTROABS 7.0 05/02/2015    ASSESSMENT & PLAN:  Breast cancer of lower-inner quadrant of right female breast Right breast5 x 6 x 3.8 cm lesion of non-masslike and nodular enhancement: Invasive ductal carcinoma with DCIS, grade 3, ER 100%; PR 63%,, HER-2 negative ratio 1.16, Ki-67 83% Right Breast LOQ: 01/30/15: DCIS involving the papillary lesion ER 51%, PR 45% Left breast4.5x5x3.5 cm area of non-mass enhancement adjacent to complex sclerosing lesion extending 3.5 cm IDC with DCIS, ER/PR Neg Ki 67 13%; Her 2 Neg; Neo-adjuvant chemotherapy with Taxol and carboplatin  weekly 4 started 02/20/2015 -03/13/2015 stopped due to psychiatric illness and hospitalization. Left mastectomy 05/04/15 : IDC grade 2/3; 5 cm with IgG DCIS, 0/5 lymph nodes T3 N0 stage IIB;  Right mastectomy: DCIS low to high-grade multiple foci 5.5 cm 0/2 lymph nodes Tis N0 ------------------------------------------------------------------------------------------------------------------------------------------------------ Current treatment: Anastrozole 1 mg dailyStarted 07/26/2015 Anastrozole toxicities: Mild fatigue Constipation  Major depression, anxiety disorder:She tells me that she takes hydrocodone to take the edge off from the chronic pain that she has.  She is concerned that they are making a big deal out of her pain medication usage. H/OIntracerebral hemorrhage: No new symptoms related to this.  Surveillance: No role of imaging because she had bilateral mastectomies. Chest wall examination does not reveal any lumps or nodules of concern. Return to clinic in1 year for follow-up    No orders of the defined types were placed in this encounter.  The patient  has a good understanding of the overall plan. she agrees with it. she will call with any problems that may develop before the next visit here.  Total time spent: 20 mins including face to face time and time spent for planning, charting and coordination of care  Nicholas Lose, MD 12/12/2019  I, Cloyde Reams Dorshimer, am acting as scribe for Dr. Nicholas Lose.  I have reviewed the above documentation for accuracy and completeness, and I agree with the above.

## 2019-12-12 ENCOUNTER — Inpatient Hospital Stay: Payer: Medicare (Managed Care) | Attending: Hematology and Oncology | Admitting: Hematology and Oncology

## 2019-12-12 ENCOUNTER — Other Ambulatory Visit: Payer: Self-pay

## 2019-12-12 DIAGNOSIS — Z170 Estrogen receptor positive status [ER+]: Secondary | ICD-10-CM | POA: Diagnosis not present

## 2019-12-12 DIAGNOSIS — Z9013 Acquired absence of bilateral breasts and nipples: Secondary | ICD-10-CM | POA: Diagnosis not present

## 2019-12-12 DIAGNOSIS — C50311 Malignant neoplasm of lower-inner quadrant of right female breast: Secondary | ICD-10-CM | POA: Insufficient documentation

## 2019-12-12 DIAGNOSIS — F329 Major depressive disorder, single episode, unspecified: Secondary | ICD-10-CM | POA: Insufficient documentation

## 2019-12-12 NOTE — Assessment & Plan Note (Signed)
Right breast5 x 6 x 3.8 cm lesion of non-masslike and nodular enhancement: Invasive ductal carcinoma with DCIS, grade 3, ER 100%; PR 63%,, HER-2 negative ratio 1.16, Ki-67 83% Right Breast LOQ: 01/30/15: DCIS involving the papillary lesion ER 51%, PR 45% Left breast4.5x5x3.5 cm area of non-mass enhancement adjacent to complex sclerosing lesion extending 3.5 cm IDC with DCIS, ER/PR Neg Ki 67 13%; Her 2 Neg; Neo-adjuvant chemotherapy with Taxol and carboplatin weekly 4 started 02/20/2015 -03/13/2015 stopped due to psychiatric illness and hospitalization. Left mastectomy 05/04/15 : IDC grade 2/3; 5 cm with IgG DCIS, 0/5 lymph nodes T3 N0 stage IIB;  Right mastectomy: DCIS low to high-grade multiple foci 5.5 cm 0/2 lymph nodes Tis N0 ------------------------------------------------------------------------------------------------------------------------------------------------------ Current treatment: Anastrozole 1 mg dailyStarted 07/26/2015 Anastrozole toxicities: Mild fatigue Constipation  Major depression, anxiety disorder:Told me she stopped psychiatric care. H/OIntracerebral hemorrhage: No new symptoms related to this.  Surveillance: No role of imaging because she had bilateral mastectomies. Return to clinic in1 year for follow-up

## 2019-12-13 ENCOUNTER — Telehealth: Payer: Self-pay | Admitting: Hematology and Oncology

## 2019-12-13 NOTE — Telephone Encounter (Signed)
I left message regarding schedule

## 2020-07-19 ENCOUNTER — Telehealth: Payer: Self-pay | Admitting: *Deleted

## 2020-07-19 NOTE — Telephone Encounter (Signed)
Received a message from patient that she needs to see Dr. Lindi Adie. Called and spoke with patient and she is asking to see Dr. Lindi Adie and that she should be seen every 6 months. Explained to her that typically she would see the surgeon every 6 months and then Dr. Lindi Adie every 6 months.  She states "I refuse to see anyone at the surgeon's office they are mean and don't like me".  She denies any new problems and I informed her that she has a follow up in February.  She states "I want to see Dr. Lindi Adie soon."  Appointment made and confirmed for 10/13 at 215.

## 2020-07-31 NOTE — Progress Notes (Signed)
Patient Care Team: Orpah Cobb, MD as PCP - General (Cardiology) Claud Kelp, MD as Consulting Physician (General Surgery) Serena Croissant, MD as Consulting Physician (Hematology and Oncology) Chipper Herb, MD (Inactive) as Consulting Physician (Radiation Oncology) Donnelly Angelica, RN as Registered Nurse Pershing Proud, RN as Registered Nurse Hubbard Hartshorn, NP (Inactive) as Nurse Practitioner (Nurse Practitioner) Keturah Barre, MD as Consulting Physician (Otolaryngology)  DIAGNOSIS:    ICD-10-CM   1. Malignant neoplasm of lower-inner quadrant of right breast of female, estrogen receptor positive ([facility])  C50.311    Z17.0     SUMMARY OF ONCOLOGIC HISTORY: Oncology History  Breast cancer of lower-inner quadrant of right female breast ([facility])  01/10/2015 Imaging   Ultrasound breast: Right breast 4:00: 2.3 cm lesion, at 4:00 6 cm from nipple to 0.4 cm hypoechoic mass, left breast 1130; 2 cm from nipple 0.4 cm mass   01/12/2015 Initial Diagnosis   Right breast biopsy: Invasive ductal carcinoma with DCIS, grade 3, ER 0%, PR 0%, HER-2 negative ratio 1.16, Ki-67 83%; left breast biopsy complex sclerosing lesion   01/19/2015 Breast MRI   Right breast 5 x 6 x 3.8 cm lesion of non-masslike and nodular enhancement: Left breast 4.5x5x3.5 cm area of non-mass enhancement adjacent to complex sclerosing lesion extending 3.5 sinus lateral to biopsy, no lymph nodes   01/30/2015 Initial Biopsy   Left Breast Biopsy: IDC with DCIS, ER 100%; PR 63%, Ki 67 13%; Her 2 Neg; Right: LOQ ER 51%, PR 45%   02/20/2015 - 03/13/2015 Neo-Adjuvant Chemotherapy   Neoadjuvant Taxol and carboplatin weekly 4, stopped early because of psychiatric illness   03/15/2015 - 03/22/2015 Hospital Admission   Severe generalized anxiety disorder, severe depression, small bitemporal and left frontal subarachnoid hemorrhage from a fall in the hospital   05/04/2015 Surgery   Left mastectomy: IDC grade 2/3; 5 cm with DCIS, 0/5  lymph nodes ER/PR Pos, Her 2 Neg;T3 N0 stage IIB; right mastectomy: DCIS low to high-grade multiple foci 5.5 cm 0/2 lymph nodes Tis N0 ER/PR Neg   07/26/2015 -  Anti-estrogen oral therapy   Anastrozole 1 mg daily 5-10 years   03/25/2016 Survivorship   SCP completed and mailed to patient     CHIEF COMPLIANT: Follow-up of right breast cancer on anastrozole  INTERVAL HISTORY: Grace Paul is a 85 y.o. with above-mentioned history of right breast cancer treated with neoadjuvant chemotherapy, bilateral mastectomies, and who is currently on anastrozole therapy.She presents to the clinic today for follow-up.   ALLERGIES:  is allergic to chlorpromazine, gentian violet, halcion  [triazolam], remeron  [mirtazapine], trifluoperazine, abilify [aripiprazole], clarithromycin, cleocin [clindamycin hcl], compazine [prochlorperazine edisylate], epinephrine, hyoscyamine sulfate, and trazodone.  MEDICATIONS:  Current Outpatient Medications  Medication Sig Dispense Refill  . acetaminophen (TYLENOL) 325 MG tablet Take 650 mg by mouth every 6 (six) hours as needed.    Marland Kitchen amLODipine (NORVASC) 2.5 MG tablet Take 2.5 mg by mouth every morning.    Marland Kitchen anastrozole (ARIMIDEX) 1 MG tablet Take 1 tablet (1 mg total) by mouth daily. 90 tablet 0  . aspirin 81 MG tablet Take 81 mg by mouth daily.    Marland Kitchen HYDROcodone-acetaminophen (NORCO/VICODIN) 5-325 MG tablet Take 1 tablet by mouth daily. 30 tablet 0  . meclizine (ANTIVERT) 25 MG tablet Take 1 tablet (25 mg total) by mouth 3 (three) times daily as needed. 30 tablet 0  . Melatonin 10 MG CAPS Take 1 tablet by mouth at bedtime.    . metoprolol succinate (  TOPROL-XL) 25 MG 24 hr tablet Take 12.5 mg by mouth 2 (two) times daily.     . NEOMYCIN-POLYMYXIN-HYDROCORTISONE (CORTISPORIN) 1 % SOLN OTIC solution Place 3 drops into both ears as needed. 10 mL 1  . Propylene Glycol (SYSTANE BALANCE) 0.6 % SOLN Place 1 drop into both eyes at bedtime.    . vitamin B-12 (CYANOCOBALAMIN) 1000  MCG tablet Take 1,000 mcg by mouth daily.     No current facility-administered medications for this visit.    PHYSICAL EXAMINATION: ECOG PERFORMANCE STATUS: 1 - Symptomatic but completely ambulatory  There were no vitals filed for this visit. There were no vitals filed for this visit.  BREAST: No palpable masses or nodules in either right or left breasts. No palpable axillary supraclavicular or infraclavicular adenopathy no breast tenderness or nipple discharge. (exam performed in the presence of a chaperone)  LABORATORY DATA:  I have reviewed the data as listed CMP Latest Ref Rng & Units 06/24/2015 05/28/2015 05/02/2015  Glucose 65 - 99 mg/dL 107(H) 111(H) 104(H)  BUN 6 - 20 mg/dL $Remove'10 12 18  'ldYNWzL$ Creatinine 0.44 - 1.00 mg/dL 0.75 0.90 0.83  Sodium 135 - 145 mmol/L 140 137 139  Potassium 3.5 - 5.1 mmol/L 3.7 3.9 4.0  Chloride 101 - 111 mmol/L 107 103 105  CO2 22 - 32 mmol/L $RemoveB'26 26 25  'QtedMhep$ Calcium 8.9 - 10.3 mg/dL 9.4 9.5 9.5  Total Protein 6.5 - 8.1 g/dL - 7.2 6.5  Total Bilirubin 0.3 - 1.2 mg/dL - 0.5 0.4  Alkaline Phos 38 - 126 U/L - 71 74  AST 15 - 41 U/L - 19 20  ALT 14 - 54 U/L - 17 17    Lab Results  Component Value Date   WBC 8.9 06/24/2015   HGB 13.9 06/24/2015   HCT 41.2 06/24/2015   MCV 90.7 06/24/2015   PLT 210 06/24/2015   NEUTROABS 7.0 05/02/2015    ASSESSMENT & PLAN:  Breast cancer of lower-inner quadrant of right female breast Right breast5 x 6 x 3.8 cm lesion of non-masslike and nodular enhancement: Invasive ductal carcinoma with DCIS, grade 3, ER 100%; PR 63%,, HER-2 negative ratio 1.16, Ki-67 83% Right Breast LOQ: 01/30/15: DCIS involving the papillary lesion ER 51%, PR 45% Left breast4.5x5x3.5 cm area of non-mass enhancement adjacent to complex sclerosing lesion extending 3.5 cm IDC with DCIS, ER/PR Neg Ki 67 13%; Her 2 Neg; Neo-adjuvant chemotherapy with Taxol and carboplatin weekly 4 started 02/20/2015 -03/13/2015 stopped due to psychiatric illness and  hospitalization. Left mastectomy 05/04/15 : IDC grade 2/3; 5 cm with IgG DCIS, 0/5 lymph nodes T3 N0 stage IIB;  Right mastectomy: DCIS low to high-grade multiple foci 5.5 cm 0/2 lymph nodes Tis N0 ------------------------------------------------------------------------------------------------------------------------------------------------------ Current treatment: Anastrozole 1 mg dailyStarted 07/26/2015 Anastrozole toxicities: Mild fatigue Constipation  Major depression, anxiety disorder:Continues to have issues with anxiety and depression. H/OIntracerebral hemorrhage: No new symptoms related to this.  Surveillance: No role of imaging because she had bilateral mastectomies. Chest wall examination does not reveal any lumps or nodules of concern. Return to clinic in1 yearfor follow-up    No orders of the defined types were placed in this encounter.  The patient has a good understanding of the overall plan. she agrees with it. she will call with any problems that may develop before the next visit here.  Total time spent: 20 mins including face to face time and time spent for planning, charting and coordination of care  Nicholas Lose, MD 08/01/2020  I, Cloyde Reams  Dorshimer, am acting as scribe for Dr. Nicholas Lose.  I have reviewed the above documentation for accuracy and completeness, and I agree with the above.

## 2020-08-01 ENCOUNTER — Inpatient Hospital Stay: Payer: Medicare Other | Attending: Hematology and Oncology | Admitting: Hematology and Oncology

## 2020-08-01 ENCOUNTER — Other Ambulatory Visit: Payer: Self-pay

## 2020-08-01 DIAGNOSIS — Z170 Estrogen receptor positive status [ER+]: Secondary | ICD-10-CM | POA: Diagnosis not present

## 2020-08-01 DIAGNOSIS — Z9013 Acquired absence of bilateral breasts and nipples: Secondary | ICD-10-CM | POA: Insufficient documentation

## 2020-08-01 DIAGNOSIS — F329 Major depressive disorder, single episode, unspecified: Secondary | ICD-10-CM | POA: Insufficient documentation

## 2020-08-01 DIAGNOSIS — C50912 Malignant neoplasm of unspecified site of left female breast: Secondary | ICD-10-CM | POA: Diagnosis not present

## 2020-08-01 DIAGNOSIS — F419 Anxiety disorder, unspecified: Secondary | ICD-10-CM | POA: Diagnosis not present

## 2020-08-01 DIAGNOSIS — C50311 Malignant neoplasm of lower-inner quadrant of right female breast: Secondary | ICD-10-CM | POA: Diagnosis present

## 2020-08-01 NOTE — Assessment & Plan Note (Signed)
Right breast5 x 6 x 3.8 cm lesion of non-masslike and nodular enhancement: Invasive ductal carcinoma with DCIS, grade 3, ER 100%; PR 63%,, HER-2 negative ratio 1.16, Ki-67 83% Right Breast LOQ: 01/30/15: DCIS involving the papillary lesion ER 51%, PR 45% Left breast4.5x5x3.5 cm area of non-mass enhancement adjacent to complex sclerosing lesion extending 3.5 cm IDC with DCIS, ER/PR Neg Ki 67 13%; Her 2 Neg; Neo-adjuvant chemotherapy with Taxol and carboplatin weekly 4 started 02/20/2015 -03/13/2015 stopped due to psychiatric illness and hospitalization. Left mastectomy 05/04/15 : IDC grade 2/3; 5 cm with IgG DCIS, 0/5 lymph nodes T3 N0 stage IIB;  Right mastectomy: DCIS low to high-grade multiple foci 5.5 cm 0/2 lymph nodes Tis N0 ------------------------------------------------------------------------------------------------------------------------------------------------------ Current treatment: Anastrozole 1 mg dailyStarted 07/26/2015 Anastrozole toxicities: Mild fatigue Constipation  Major depression, anxiety disorder:She tells me that she takes hydrocodone to take the edge off from the chronic pain that she has.  She is concerned that they are making a big deal out of her pain medication usage. H/OIntracerebral hemorrhage: No new symptoms related to this.  Surveillance: No role of imaging because she had bilateral mastectomies. Chest wall examination does not reveal any lumps or nodules of concern. Return to clinic in1 yearfor follow-up

## 2020-08-03 ENCOUNTER — Telehealth: Payer: Self-pay | Admitting: Hematology and Oncology

## 2020-08-03 NOTE — Telephone Encounter (Signed)
Scheduled per 10/13 los. Pt will receive an updated appt calendar at next visit per appt notes

## 2020-08-11 ENCOUNTER — Encounter (HOSPITAL_COMMUNITY): Payer: Self-pay

## 2020-08-11 ENCOUNTER — Ambulatory Visit (HOSPITAL_COMMUNITY)
Admission: EM | Admit: 2020-08-11 | Discharge: 2020-08-11 | Disposition: A | Discharge status: Home / Self Care | Payer: Medicare Other | Attending: Family Medicine | Admitting: Family Medicine

## 2020-08-11 ENCOUNTER — Other Ambulatory Visit: Payer: Self-pay

## 2020-08-11 DIAGNOSIS — S0083XA Contusion of other part of head, initial encounter: Secondary | ICD-10-CM | POA: Diagnosis not present

## 2020-08-11 DIAGNOSIS — S0181XA Laceration without foreign body of other part of head, initial encounter: Secondary | ICD-10-CM | POA: Diagnosis not present

## 2020-08-11 NOTE — ED Triage Notes (Signed)
Pt presents with chin laceration from a fall this morning  While trying to bring some packages in with no LOC.

## 2020-08-11 NOTE — Discharge Instructions (Addendum)

## 2020-08-11 NOTE — ED Triage Notes (Signed)
Pt reports fall approx 1 hr ago without LOC when tripping over an object in sidewalk .  States she fell onto right side of face.  Denies HA, neck pain or back pain.  Denies taking blood thinners.  Denies any loose teeth.  Pt answering questions appropriately.  Small laceration noted under chin with minimal bleeding.
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
## 2020-08-18 ENCOUNTER — Other Ambulatory Visit: Payer: Self-pay

## 2020-08-18 ENCOUNTER — Ambulatory Visit (HOSPITAL_COMMUNITY)
Admission: EM | Admit: 2020-08-18 | Discharge: 2020-08-18 | Disposition: A | Discharge status: Home / Self Care | Payer: Medicare Other

## 2020-08-18 NOTE — ED Triage Notes (Signed)
Pt came in to get the two suture removed underneath her chin.  Pt tolerated well.

## 2020-09-01 ENCOUNTER — Encounter (HOSPITAL_COMMUNITY): Payer: Self-pay | Admitting: *Deleted

## 2020-09-01 ENCOUNTER — Other Ambulatory Visit: Payer: Self-pay

## 2020-09-01 ENCOUNTER — Ambulatory Visit (HOSPITAL_COMMUNITY)
Admission: EM | Admit: 2020-09-01 | Discharge: 2020-09-01 | Disposition: A | Discharge status: Home / Self Care | Payer: Medicare Other | Attending: Family Medicine | Admitting: Family Medicine

## 2020-09-01 DIAGNOSIS — H00012 Hordeolum externum right lower eyelid: Secondary | ICD-10-CM | POA: Diagnosis not present

## 2020-09-01 MED ORDER — ERYTHROMYCIN 5 MG/GM OP OINT: TOPICAL_OINTMENT | OPHTHALMIC | 0 refills | Status: DC

## 2020-09-01 NOTE — ED Triage Notes (Signed)
PT reports she may have a stye on Rt eye  For past 3 days . Pt reports she fell 3 weeks ago and ht the rt side of her face . Pt aws seen and treated for fall.

## 2020-09-01 NOTE — Discharge Instructions (Addendum)
Warm compresses to the eye 10 minutes at a time, 2-3 times a day.  Ointment as prescribed Follow up as needed for continued or worsening symptoms
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
## 2020-10-11 ENCOUNTER — Other Ambulatory Visit: Payer: Self-pay | Admitting: *Deleted

## 2020-10-11 DIAGNOSIS — C50311 Malignant neoplasm of lower-inner quadrant of right female breast: Secondary | ICD-10-CM

## 2020-10-11 MED ORDER — ANASTROZOLE 1 MG PO TABS: 1.0000 mg | ORAL_TABLET | Freq: Every day | ORAL | 3 refills | Status: DC

## 2020-10-11 NOTE — Telephone Encounter (Signed)
Received message from patient that she needed a refill on her anastrozole.  Rx sent to her pharmacy.

## 2020-12-10 NOTE — Assessment & Plan Note (Deleted)
Right breast5 x 6 x 3.8 cm lesion of non-masslike and nodular enhancement: Invasive ductal carcinoma with DCIS, grade 3, ER 100%; PR 63%,, HER-2 negative ratio 1.16, Ki-67 83% Right Breast LOQ: 01/30/15: DCIS involving the papillary lesion ER 51%, PR 45% Left breast4.5x5x3.5 cm area of non-mass enhancement adjacent to complex sclerosing lesion extending 3.5 cm IDC with DCIS, ER/PR Neg Ki 67 13%; Her 2 Neg; Neo-adjuvant chemotherapy with Taxol and carboplatin weekly 4 started 02/20/2015 -03/13/2015 stopped due to psychiatric illness and hospitalization. Left mastectomy 05/04/15 : IDC grade 2/3; 5 cm with IgG DCIS, 0/5 lymph nodes T3 N0 stage IIB;  Right mastectomy: DCIS low to high-grade multiple foci 5.5 cm 0/2 lymph nodes Tis N0 ------------------------------------------------------------------------------------------------------------------------------------------------------ Current treatment: Anastrozole 1 mg dailyStarted 07/26/2015 Anastrozole toxicities: Mild fatigue Constipation  Major depression, anxiety disorder:Continues to have issues with anxiety and depression. H/OIntracerebral hemorrhage: No new symptoms related to this.  Surveillance: No role of imaging because she had bilateral mastectomies. Chest wall examination does not reveal any lumps or nodules of concern. Return to clinic in1 yearfor follow-up

## 2020-12-10 NOTE — Progress Notes (Incomplete)
Patient Care Team: Dixie Dials, MD as PCP - General (Cardiology) [name], MD as Consulting Physician (General Surgery) Nicholas Lose, MD as Consulting Physician (Hematology and Oncology) Arloa Koh, MD (Inactive) as Consulting Physician (Radiation Oncology) Rockwell Germany, RN as Registered Nurse Mauro Kaufmann, RN as Registered Nurse Holley Bouche, NP (Inactive) as Nurse Practitioner (Nurse Practitioner) Thornell Sartorius, MD as Consulting Physician (Otolaryngology)  DIAGNOSIS:    ICD-10-CM   1. Malignant neoplasm of lower-inner quadrant of right breast of female, estrogen receptor positive ([employer])  C50.311    Z17.0     SUMMARY OF ONCOLOGIC HISTORY: Oncology History  Breast cancer of lower-inner quadrant of right female breast ([place])  01/10/2015 Imaging   Ultrasound breast: Right breast 4:00: 2.3 cm lesion, at 4:00 6 cm from nipple to 0.4 cm hypoechoic mass, left breast 1130; 2 cm from nipple 0.4 cm mass   01/12/2015 Initial Diagnosis   Right breast biopsy: Invasive ductal carcinoma with DCIS, grade 3, ER 0%, PR 0%, HER-2 negative ratio 1.16, Ki-67 83%; left breast biopsy complex sclerosing lesion   01/19/2015 Breast MRI   Right breast 5 x 6 x 3.8 cm lesion of non-masslike and nodular enhancement: Left breast 4.5x5x3.5 cm area of non-mass enhancement adjacent to complex sclerosing lesion extending 3.5 sinus lateral to biopsy, no lymph nodes   01/30/2015 Initial Biopsy   Left Breast Biopsy: IDC with DCIS, ER 100%; PR 63%, Ki 67 13%; Her 2 Neg; Right: LOQ ER 51%, PR 45%   02/20/2015 - 03/13/2015 Neo-Adjuvant Chemotherapy   Neoadjuvant Taxol and carboplatin weekly 4, stopped early because of psychiatric illness   03/15/2015 - 03/22/2015 Hospital Admission   Severe generalized anxiety disorder, severe depression, small bitemporal and left frontal subarachnoid hemorrhage from a fall in the hospital   05/04/2015 Surgery   Left mastectomy: IDC grade 2/3; 5 cm with DCIS, 0/5  lymph nodes ER/PR Pos, Her 2 Neg;T3 N0 stage IIB; right mastectomy: DCIS low to high-grade multiple foci 5.5 cm 0/2 lymph nodes Tis N0 ER/PR Neg   07/26/2015 -  Anti-estrogen oral therapy   Anastrozole 1 mg daily 5-10 years   03/25/2016 Survivorship   SCP completed and mailed to patient     CHIEF COMPLIANT: Follow-up of right breast cancer on anastrozole  INTERVAL HISTORY: Grace Paul is a 86 y.o. with above-mentioned history of right breast cancer treated with neoadjuvant chemotherapy, bilateral mastectomies, andwhois currently on anastrozole therapy.Shepresents to the clinictoday for follow-up.   ALLERGIES:  is allergic to chlorpromazine, gentian violet, halcion  [triazolam], remeron  [mirtazapine], trifluoperazine, abilify [aripiprazole], clarithromycin, cleocin [clindamycin hcl], compazine [prochlorperazine edisylate], epinephrine, hyoscyamine sulfate, and trazodone.  MEDICATIONS:  Current Outpatient Medications  Medication Sig Dispense Refill  . acetaminophen (TYLENOL) 325 MG tablet Take 650 mg by mouth every 6 (six) hours as needed.    Marland Kitchen amLODipine (NORVASC) 2.5 MG tablet Take 2.5 mg by mouth every morning.    Marland Kitchen anastrozole (ARIMIDEX) 1 MG tablet Take 1 tablet (1 mg total) by mouth daily. 90 tablet 3  . aspirin 81 MG tablet Take 81 mg by mouth daily.    Marland Kitchen erythromycin ophthalmic ointment Place a 1/2 inch ribbon of ointment into the lower eyelid up to 6 times a day. 1 g 0  . HYDROcodone-acetaminophen (NORCO/VICODIN) 5-325 MG tablet Take 1 tablet by mouth daily. 30 tablet 0  . meclizine (ANTIVERT) 25 MG tablet Take 1 tablet (25 mg total) by mouth 3 (three) times daily as needed. [place]  tablet 0  . metoprolol succinate (TOPROL-XL) 25 MG 24 hr tablet Take 12.5 mg by mouth 2 (two) times daily.     . NEOMYCIN-POLYMYXIN-HYDROCORTISONE (CORTISPORIN) 1 % SOLN OTIC solution Place 3 drops into both ears as needed. 10 mL 1  . Propylene Glycol (SYSTANE BALANCE) 0.6 % SOLN Place 1 drop into both  eyes at bedtime.    . vitamin B-12 (CYANOCOBALAMIN) 1000 MCG tablet Take 1,000 mcg by mouth daily.     No current facility-administered medications for this visit.    PHYSICAL EXAMINATION: ECOG PERFORMANCE STATUS: {CHL ONC ECOG PS:[id number]}  There were no vitals filed for this visit. There were no vitals filed for this visit.  BREAST:*** No palpable masses or nodules in either right or left breasts. No palpable axillary supraclavicular or infraclavicular adenopathy no breast tenderness or nipple discharge. (exam performed in the presence of a chaperone)  LABORATORY DATA:  I have reviewed the data as listed CMP Latest Ref Rng & Units 06/24/2015 05/28/2015 05/02/2015  Glucose 65 - 99 mg/dL 107(H) 111(H) 104(H)  BUN 6 - 20 mg/dL $Remove'10 12 18  'DamwZuy$ Creatinine 0.44 - 1.00 mg/dL 0.75 0.90 0.83  Sodium 135 - 145 mmol/L 140 137 139  Potassium 3.5 - 5.1 mmol/L 3.7 3.9 4.0  Chloride 101 - 111 mmol/L 107 103 105  CO2 22 - 32 mmol/L $RemoveB'26 26 25  'BIZNQNaJ$ Calcium 8.9 - 10.3 mg/dL 9.4 9.5 9.5  Total Protein 6.5 - 8.1 g/dL - 7.2 6.5  Total Bilirubin 0.3 - 1.2 mg/dL - 0.5 0.4  Alkaline Phos 38 - 126 U/L - 71 74  AST 15 - 41 U/L - 19 20  ALT 14 - 54 U/L - 17 17    Lab Results  Component Value Date   WBC 8.9 06/24/2015   HGB 13.9 06/24/2015   HCT 41.2 06/24/2015   MCV 90.7 06/24/2015   PLT 210 06/24/2015   NEUTROABS 7.0 05/02/2015    ASSESSMENT & PLAN:  No problem-specific Assessment & Plan notes found for this encounter.    No orders of the defined types were placed in this encounter.  The patient has a good understanding of the overall plan. she agrees with it. she will call with any problems that may develop before the next visit here.  Total time spent: *** mins including face to face time and time spent for planning, charting and coordination of care  Rulon Eisenmenger, MD, MPH 12/10/2020  I, Molly Dorshimer, am acting as scribe for Dr. Nicholas Lose.  {insert scribe attestation}

## 2020-12-11 ENCOUNTER — Inpatient Hospital Stay: Payer: Medicare Other | Attending: Hematology and Oncology | Admitting: Hematology and Oncology

## 2020-12-11 DIAGNOSIS — Z170 Estrogen receptor positive status [ER+]: Secondary | ICD-10-CM

## 2021-01-18 ENCOUNTER — Ambulatory Visit
Admission: RE | Admit: 2021-01-18 | Discharge: 2021-01-18 | Disposition: A | Discharge status: Home / Self Care | Payer: Medicare Other | Source: Ambulatory Visit | Attending: Cardiovascular Disease | Admitting: Cardiovascular Disease

## 2021-01-18 ENCOUNTER — Other Ambulatory Visit: Payer: Self-pay | Admitting: Cardiovascular Disease

## 2021-01-18 DIAGNOSIS — M25562 Pain in left knee: Secondary | ICD-10-CM

## 2021-01-28 ENCOUNTER — Other Ambulatory Visit: Payer: Self-pay | Admitting: Hematology and Oncology

## 2021-01-28 ENCOUNTER — Telehealth: Payer: Self-pay | Admitting: Hematology and Oncology

## 2021-01-28 MED ORDER — HYDROCODONE-ACETAMINOPHEN 5-325 MG PO TABS: 1.0000 | ORAL_TABLET | ORAL | 0 refills | Status: AC

## 2021-01-28 NOTE — Telephone Encounter (Signed)
Cancelled upcoming appointment per 4/11 schedule message. Patient unaware that she had an appointment and wasn't sure what it was for.

## 2021-01-30 ENCOUNTER — Ambulatory Visit: Payer: Medicare Other | Admitting: Hematology and Oncology

## 2021-02-22 ENCOUNTER — Other Ambulatory Visit: Payer: Self-pay

## 2021-02-22 ENCOUNTER — Ambulatory Visit (INDEPENDENT_AMBULATORY_CARE_PROVIDER_SITE_OTHER): Payer: Medicare Other | Admitting: Family Medicine

## 2021-02-22 VITALS — BP 187/64 | Ht 60.0 in | Wt 115.0 lb

## 2021-02-22 DIAGNOSIS — M25562 Pain in left knee: Secondary | ICD-10-CM | POA: Diagnosis not present

## 2021-02-22 MED ORDER — METHYLPREDNISOLONE ACETATE 40 MG/ML IJ SUSP
40.0000 mg | Freq: Once | INTRAMUSCULAR | Status: AC
  Administered 2021-02-22: 40 mg via INTRA_ARTICULAR

## 2021-02-22 NOTE — Patient Instructions (Signed)
You had an injection today. Things to be aware of after injection are listed below:   . You may experience no significant improvement or even a slight worsening in your symptoms during the first 24 to 48 hours. After that we expect your symptoms to improve gradually over the next 2 weeks for the medicine to have its maximal effect. You should continue to have improvement out to 6 weeks after your injection. . We recommend icing the site of the injection for 20 minutes 1-2 times the day of your injection and as needed for pain over the following several days. . You may shower but no swimming, tub bath or Jacuzzi for 24 hours. . If your bandage falls off this does not need to be replaced. It is appropriate to remove the bandage after 4 hours. . You may resume light activities as tolerated.  It can take several weeks to see improvements following injection. If after 2 weeks you are continuing to have worsening symptoms, please call our office to discuss what the next appropriate actions should be including the potential for a return office visit or other diagnostic testing.  POSSIBLE PROCEDURE SIDE EFFECTS: The side effects of the injection are usually minimal, self-limited, and usually will resolve on their own. Common side effects that can occur over the first several days following injection include:  . Increased numbness or tingling . Worsening pain, stiffness, or slight weakness . Swelling or bruising at the injection site  If you are concerned, please feel free to contact the office with questions.   Please call our office immediately if you experience any of the following symptoms over the next 2 weeks as these can be signs of infection:  . Fever greater than 100.5F . Significant swelling at the injection site . Significant redness or drainage from the injection site  

## 2021-02-22 NOTE — Progress Notes (Signed)
[facility]: Attending Note: I have reviewed the chart, discussed wit the Sports Medicine Fellow. I agree with assessment and treatment plan as detailed in the Fellow's note.  

## 2021-02-22 NOTE — Progress Notes (Signed)
Office Visit Note   Patient: Grace Paul           Date of Birth: 1935/02/07           MRN: [id number] Visit Date: 02/22/2021 Requested by: Dixie Dials, MD [place],  [place] [address] PCP: Dixie Dials, MD  Subjective: CC: Left medial knee pain  HPI: 86 year old female presenting to clinic today with concerns of 6 weeks of left knee pain and swelling.  Patient states that she loves running, but "I did something really stupid."  She states that prior to the onset of her symptoms, she was feeling particularly energetic and ran up a large hill while carrying a bag full of cans.  Shortly after this exertion, she started to notice an aching in the medial side of her left knee.  Despite this, she did her best to remain active, but she describes that her left knee gave out on her, causing her to twist and fall.  Since this time, she endorses a sharp pain on the medial aspect of her knee.  She tried to wear an over-the-counter knee brace, which she finds to be very uncomfortable.  She spoke with another provider about this, and was told that she should try to stop running-patient becomes tearful at this idea, stating that running is "one of the few times I am very happy."  She is desperate to continue her running activities, and wants to know what she can do to help her knee recover.  She denies any catching or locking of the knee, but does endorse pain with flexion or twisting the knee.  She says she has been able to run despite this pain, and denies a limp with this activity.  Per patient, "I just want to make sure that my knee is not going to get worse."              ROS:   All other systems were reviewed and are negative.  Objective: Vital Signs: BP (!) 187/64   Ht 5' (1.524 m)   Wt 115 lb (52.2 kg)   BMI 22.46 kg/m  No flowsheet data found.   No flowsheet data found.  Physical Exam:  General:  Alert and oriented, in no acute distress. Pulm:  Breathing unlabored. Psy:   Normal mood, congruent affect. Skin: Left knee without bruises, rashes, or erythema. Overlying skin intact.  Left knee exam:  General: Normal gait Standing exam: No varus or valgus deformity of the knee.   Seated Exam:  No significant patellar crepitus, Negative J-Sign.  Full range of motion in extension without pain.  Extremes of flexion are reduced due to discomfort in the medial aspect.  Palpation: Significant tenderness palpation over the medial joint line on the left knee.  There is no tenderness with patellar compression, or palpation of lateral joint line.  No tenderness over the patellar tendon.    Supine exam: Small effusion, normal patellar mobility without pain.   Ligamentous Exam:  No pain or laxity with anterior/posterior drawer.  No obvious Sag.  No pain or laxity with varus or valgus stress across the knee.  Meniscus:  McMurray with significant pain over the medial aspect.   Strength: Hip flexion (L1), Hip Aduction (L2), Knee Extension (L3) are 5/5 Bilaterally Foot Inversion (L4), Dorsiflexion (L5), and Eversion (S1) 5/5 Bilaterally  Sensation: Intact to light touch medial and lateral aspects of lower extremities, and lateral, dorsal, and medial aspects of foot.  Imaging: Extremity Ultrasound-left knee:   Quadricepts tendon visualized in long and short access, fibers intact without obvious defects or evidence of tears.  Moderate effusion within the suprapatellar pouch. Patellar Tendon intact, with no intratendinous calcifications or thickening.   Lateral joint space preserved. Lateral meniscus appears intact, without significant surrounding fluid.  Overlying lateral collateral ligament fibers appear intact.   Medial joint space appears well preserved. Medial meniscus appears somewhat extruded with subtle hypoechoic changes in the mid substance, with significant surrounding effusion. Overlying medial collateral ligament appears intact.   Impression: Knee effusion,  concerning for medial meniscal injury.   Assessment & Plan: 86 year old runner presenting to clinic with concerns of left medial knee pain for the past 6 weeks.  Examination and ultrasound is concerning for medial meniscal injury. -Discussed the importance of quadriceps exercises to help strengthen stabilize the knee, and patient was given home exercises to perform today. -Risks and benefits of corticosteroid injection discussed, patient opted to proceed.  Injection performed as described below, which patient tolerated very well.  She endorsed immediate improvement of her symptoms during anesthetic phase. -Strict return precautions and aftercare discussed. -Discussed possibility of wearing a compression sleeve for the knee as this may offer more improvement than her over-the-counter brace.  Patient encouraged to return to clinic should she consider this option. -Patient no further questions or concerns at completion of today's visit.     Procedures: Left knee cortisone Injection:  Risks and benefits of procedure discussed, Patient opted to proceed.  Written consent obtained.  Timeout performed.  Skin prepped in a sterile fashion with betadine before further cleansing with alcohol. Ethyl Chloride was used for topical analgesia.  Left knee was injected with 3cc 1% Lidocaine without epinephrine combined with 40 mg methylprednisolone via the suprapatellar approach using a 25G, 1.5in needle.   Patient tolerated the injection well with no immediate complications. Aftercare instructions were discussed, and patient was given strict return precautions.

## 2021-02-25 ENCOUNTER — Ambulatory Visit (INDEPENDENT_AMBULATORY_CARE_PROVIDER_SITE_OTHER): Payer: Medicare Other | Admitting: Family Medicine

## 2021-02-25 ENCOUNTER — Other Ambulatory Visit: Payer: Self-pay

## 2021-02-25 VITALS — BP 156/80 | Ht 60.0 in | Wt 115.0 lb

## 2021-02-25 DIAGNOSIS — M25562 Pain in left knee: Secondary | ICD-10-CM | POA: Diagnosis not present
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
## 2021-02-25 NOTE — Patient Instructions (Addendum)
[place] for activities as tolerated. I'd still recommend waiting a few days before you start running again. You still have to be careful with twisting motions and deep squats. Call us with any questions otherwise follow-up with Dr. Elouise Munroe or Dr. Nori Riis in 1 month.

## 2021-02-26 ENCOUNTER — Encounter: Payer: Self-pay | Admitting: Family Medicine

## 2021-05-14 ENCOUNTER — Telehealth: Payer: Self-pay | Admitting: Hematology and Oncology

## 2021-05-14 NOTE — Telephone Encounter (Signed)
Sch per 7/25 pt request, pt aware.

## 2021-05-22 NOTE — Progress Notes (Signed)
Patient Care Team: Orpah Cobb, MD as PCP - General (Cardiology) Claud Kelp, MD as Consulting Physician (General Surgery) Serena Croissant, MD as Consulting Physician (Hematology and Oncology) Chipper Herb, MD (Inactive) as Consulting Physician (Radiation Oncology) Donnelly Angelica, RN as Registered Nurse Pershing Proud, RN as Registered Nurse Hubbard Hartshorn, NP (Inactive) as Nurse Practitioner (Nurse Practitioner) Keturah Barre, MD as Consulting Physician (Otolaryngology)  DIAGNOSIS:    ICD-10-CM   1. Malignant neoplasm of lower-inner quadrant of right breast of female, estrogen receptor positive (HCC)  C50.311    Z17.0       SUMMARY OF ONCOLOGIC HISTORY: Oncology History  Breast cancer of lower-inner quadrant of right female breast (HCC)  01/10/2015 Imaging   Ultrasound breast: Right breast 4:00: 2.3 cm lesion, at 4:00 6 cm from nipple to 0.4 cm hypoechoic mass, left breast 1130; 2 cm from nipple 0.4 cm mass    01/12/2015 Initial Diagnosis   Right breast biopsy: Invasive ductal carcinoma with DCIS, grade 3, ER 0%, PR 0%, HER-2 negative ratio 1.16, Ki-67 83%; left breast biopsy complex sclerosing lesion    01/19/2015 Breast MRI   Right breast 5 x 6 x 3.8 cm lesion of non-masslike and nodular enhancement: Left breast 4.5x5x3.5 cm area of non-mass enhancement adjacent to complex sclerosing lesion extending 3.5 sinus lateral to biopsy, no lymph nodes    01/30/2015 Initial Biopsy   Left Breast Biopsy: IDC with DCIS, ER 100%; PR 63%, Ki 67 13%; Her 2 Neg; Right: LOQ ER 51%, PR 45%    02/20/2015 - 03/13/2015 Neo-Adjuvant Chemotherapy   Neoadjuvant Taxol and carboplatin weekly 4, stopped early because of psychiatric illness    03/15/2015 - 03/22/2015 Hospital Admission   Severe generalized anxiety disorder, severe depression, small bitemporal and left frontal subarachnoid hemorrhage from a fall in the hospital    05/04/2015 Surgery   Left mastectomy: IDC grade 2/3; 5 cm  with DCIS, 0/5 lymph nodes ER/PR Pos, Her 2 Neg;T3 N0 stage IIB; right mastectomy: DCIS low to high-grade multiple foci 5.5 cm 0/2 lymph nodes Tis N0 ER/PR Neg    07/26/2015 -  Anti-estrogen oral therapy   Anastrozole 1 mg daily 5-10 years    03/25/2016 Survivorship   SCP completed and mailed to patient      CHIEF COMPLIANT: Follow-up of right breast cancer on anastrozole  INTERVAL HISTORY: Grace Paul is a 86 y.o. with above-mentioned history of right breast cancer treated with neoadjuvant chemotherapy, bilateral mastectomies, and who is currently on anastrozole therapy. She presents to the clinic today for follow-up.  She has lots of anxieties about lots of different things in her life.  However she tells me that running is the only thing that makes it feel safe for her.  She has lots of difficulties with transportation.  Her neighbor dropped her off but she does not want to take any cab or any transportation where she has to be along with other men.  ALLERGIES:  is allergic to chlorpromazine, gentian violet, halcion  [triazolam], remeron  [mirtazapine], trifluoperazine, abilify [aripiprazole], clarithromycin, cleocin [clindamycin hcl], compazine [prochlorperazine edisylate], epinephrine, hyoscyamine sulfate, and trazodone.  MEDICATIONS:  Current Outpatient Medications  Medication Sig Dispense Refill   acetaminophen (TYLENOL) 325 MG tablet Take 650 mg by mouth every 6 (six) hours as needed.     amLODipine (NORVASC) 2.5 MG tablet Take 2.5 mg by mouth every morning.     anastrozole (ARIMIDEX) 1 MG tablet Take 1 tablet (1 mg total) by  mouth daily. 90 tablet 3   aspirin 81 MG tablet Take 81 mg by mouth daily.     erythromycin ophthalmic ointment Place a 1/2 inch ribbon of ointment into the lower eyelid up to 6 times a day. 1 g 0   HYDROcodone-acetaminophen (NORCO/VICODIN) 5-325 MG tablet Take 1 tablet by mouth every other day. 30 tablet 0   meclizine (ANTIVERT) 25 MG tablet Take 1 tablet (25  mg total) by mouth 3 (three) times daily as needed. 30 tablet 0   metoprolol succinate (TOPROL-XL) 25 MG 24 hr tablet Take 12.5 mg by mouth 2 (two) times daily.      NEOMYCIN-POLYMYXIN-HYDROCORTISONE (CORTISPORIN) 1 % SOLN OTIC solution Place 3 drops into both ears as needed. 10 mL 1   Propylene Glycol (SYSTANE BALANCE) 0.6 % SOLN Place 1 drop into both eyes at bedtime.     vitamin B-12 (CYANOCOBALAMIN) 1000 MCG tablet Take 1,000 mcg by mouth daily.     No current facility-administered medications for this visit.    PHYSICAL EXAMINATION: ECOG PERFORMANCE STATUS: 1 - Symptomatic but completely ambulatory  Vitals:   05/23/21 1424  BP: (!) 151/60  Pulse: 61  Resp: 18  Temp: 97.9 F (36.6 C)  SpO2: 100%   Filed Weights   05/23/21 1424  Weight: 116 lb 6.4 oz (52.8 kg)    BREAST: No palpable masses or nodules in either right or left breasts. No palpable axillary supraclavicular or infraclavicular adenopathy no breast tenderness or nipple discharge. (exam performed in the presence of a chaperone)  LABORATORY DATA:  I have reviewed the data as listed CMP Latest Ref Rng & Units 06/24/2015 05/28/2015 05/02/2015  Glucose 65 - 99 mg/dL 107(H) 111(H) 104(H)  BUN 6 - 20 mg/dL $Remove'10 12 18  'LlYpUkF$ Creatinine 0.44 - 1.00 mg/dL 0.75 0.90 0.83  Sodium 135 - 145 mmol/L 140 137 139  Potassium 3.5 - 5.1 mmol/L 3.7 3.9 4.0  Chloride 101 - 111 mmol/L 107 103 105  CO2 22 - 32 mmol/L $RemoveB'26 26 25  'ThojXCoH$ Calcium 8.9 - 10.3 mg/dL 9.4 9.5 9.5  Total Protein 6.5 - 8.1 g/dL - 7.2 6.5  Total Bilirubin 0.3 - 1.2 mg/dL - 0.5 0.4  Alkaline Phos 38 - 126 U/L - 71 74  AST 15 - 41 U/L - 19 20  ALT 14 - 54 U/L - 17 17    Lab Results  Component Value Date   WBC 8.9 06/24/2015   HGB 13.9 06/24/2015   HCT 41.2 06/24/2015   MCV 90.7 06/24/2015   PLT 210 06/24/2015   NEUTROABS 7.0 05/02/2015    ASSESSMENT & PLAN:  Breast cancer of lower-inner quadrant of right female breast Right breast 5 x 6 x 3.8 cm lesion of non-masslike  and nodular enhancement: Invasive ductal carcinoma with DCIS, grade 3, ER 100%; PR 63%,, HER-2 negative ratio 1.16, Ki-67 83% Right Breast LOQ: 01/30/15: DCIS involving the papillary lesion ER 51%, PR 45% Left breast 4.5x5x3.5 cm area of non-mass enhancement adjacent to complex sclerosing lesion extending 3.5 cm IDC with DCIS, ER/PR Neg Ki 67 13%; Her 2 Neg; Neo-adjuvant chemotherapy with Taxol and carboplatin weekly 4 started 02/20/2015 -03/13/2015 stopped due to psychiatric illness and hospitalization. Left mastectomy 05/04/15 : IDC grade 2/3; 5 cm with IgG DCIS, 0/5 lymph nodes T3 N0 stage IIB;   Right mastectomy: DCIS low to high-grade multiple foci 5.5 cm 0/2 lymph nodes Tis N0 ------------------------------------------------------------------------------------------------------------------------------------------------------ Current treatment: Anastrozole 1 mg daily Started 07/26/2015  Anastrozole toxicities: Mild fatigue Constipation  Major depression, anxiety disorder: Continues to have issues with anxiety and depression. H/O Intracerebral hemorrhage: No new symptoms related to this.   Surveillance:   No role of imaging because she had bilateral mastectomies. Chest wall examination does not reveal any lumps or nodules of concern. Return to clinic in 1 year for follow-up with a telephone visit      No orders of the defined types were placed in this encounter.  The patient has a good understanding of the overall plan. she agrees with it. she will call with any problems that may develop before the next visit here.  Total time spent: 20 mins including face to face time and time spent for planning, charting and coordination of care  Rulon Eisenmenger, MD, MPH 05/23/2021  I, Thana Ates, am acting as scribe for Dr. Nicholas Lose.  I have reviewed the above documentation for accuracy and completeness, and I agree with the above.

## 2021-05-23 ENCOUNTER — Inpatient Hospital Stay: Payer: Medicare Other | Attending: Hematology and Oncology | Admitting: Hematology and Oncology

## 2021-05-23 ENCOUNTER — Other Ambulatory Visit: Payer: Self-pay

## 2021-05-23 DIAGNOSIS — Z79811 Long term (current) use of aromatase inhibitors: Secondary | ICD-10-CM | POA: Diagnosis not present

## 2021-05-23 DIAGNOSIS — Z9013 Acquired absence of bilateral breasts and nipples: Secondary | ICD-10-CM | POA: Insufficient documentation

## 2021-05-23 DIAGNOSIS — F419 Anxiety disorder, unspecified: Secondary | ICD-10-CM | POA: Diagnosis not present

## 2021-05-23 DIAGNOSIS — Z170 Estrogen receptor positive status [ER+]: Secondary | ICD-10-CM | POA: Diagnosis not present

## 2021-05-23 DIAGNOSIS — C50311 Malignant neoplasm of lower-inner quadrant of right female breast: Secondary | ICD-10-CM | POA: Insufficient documentation

## 2021-05-23 DIAGNOSIS — Z9221 Personal history of antineoplastic chemotherapy: Secondary | ICD-10-CM | POA: Diagnosis not present

## 2021-05-23 DIAGNOSIS — F329 Major depressive disorder, single episode, unspecified: Secondary | ICD-10-CM | POA: Diagnosis not present

## 2021-05-23 NOTE — Assessment & Plan Note (Signed)
Right breast5 x 6 x 3.8 cm lesion of non-masslike and nodular enhancement: Invasive ductal carcinoma with DCIS, grade 3, ER 100%; PR 63%,, HER-2 negative ratio 1.16, Ki-67 83% Right Breast LOQ: 01/30/15: DCIS involving the papillary lesion ER 51%, PR 45% Left breast4.5x5x3.5 cm area of non-mass enhancement adjacent to complex sclerosing lesion extending 3.5 cm IDC with DCIS, ER/PR Neg Ki 67 13%; Her 2 Neg; Neo-adjuvant chemotherapy with Taxol and carboplatin weekly 4 started 02/20/2015 -03/13/2015 stopped due to psychiatric illness and hospitalization. Left mastectomy 05/04/15 : IDC grade 2/3; 5 cm with IgG DCIS, 0/5 lymph nodes T3 N0 stage IIB;  Right mastectomy: DCIS low to high-grade multiple foci 5.5 cm 0/2 lymph nodes Tis N0 ------------------------------------------------------------------------------------------------------------------------------------------------------ Current treatment: Anastrozole 1 mg dailyStarted 07/26/2015 Anastrozole toxicities: Mild fatigue Constipation  Major depression, anxiety disorder:Continues to have issues with anxiety and depression. H/OIntracerebral hemorrhage: No new symptoms related to this.  Surveillance: No role of imaging because she had bilateral mastectomies. Chest wall examination does not reveal any lumps or nodules of concern. Return to clinic in1 yearfor follow-up

## 2021-05-30 ENCOUNTER — Ambulatory Visit: Payer: Medicare Other | Admitting: Hematology and Oncology

## 2021-06-26 ENCOUNTER — Inpatient Hospital Stay (HOSPITAL_COMMUNITY)
Admission: AD | Admit: 2021-06-26 | Discharge: 2021-06-28 | DRG: 311 | Disposition: A | Discharge status: Home / Self Care | Payer: Medicare HMO | Source: Ambulatory Visit | Attending: Cardiovascular Disease | Admitting: Cardiovascular Disease

## 2021-06-26 DIAGNOSIS — F4310 Post-traumatic stress disorder, unspecified: Secondary | ICD-10-CM | POA: Diagnosis present

## 2021-06-26 DIAGNOSIS — R001 Bradycardia, unspecified: Secondary | ICD-10-CM | POA: Diagnosis not present

## 2021-06-26 DIAGNOSIS — I447 Left bundle-branch block, unspecified: Secondary | ICD-10-CM | POA: Diagnosis not present

## 2021-06-26 DIAGNOSIS — Z87891 Personal history of nicotine dependence: Secondary | ICD-10-CM | POA: Diagnosis not present

## 2021-06-26 DIAGNOSIS — Z7982 Long term (current) use of aspirin: Secondary | ICD-10-CM | POA: Diagnosis not present

## 2021-06-26 DIAGNOSIS — R0602 Shortness of breath: Secondary | ICD-10-CM | POA: Diagnosis present

## 2021-06-26 DIAGNOSIS — F0390 Unspecified dementia without behavioral disturbance: Secondary | ICD-10-CM | POA: Diagnosis present

## 2021-06-26 DIAGNOSIS — K219 Gastro-esophageal reflux disease without esophagitis: Secondary | ICD-10-CM | POA: Diagnosis not present

## 2021-06-26 DIAGNOSIS — Z803 Family history of malignant neoplasm of breast: Secondary | ICD-10-CM

## 2021-06-26 DIAGNOSIS — Z853 Personal history of malignant neoplasm of breast: Secondary | ICD-10-CM | POA: Diagnosis not present

## 2021-06-26 DIAGNOSIS — F32A Depression, unspecified: Secondary | ICD-10-CM | POA: Diagnosis present

## 2021-06-26 DIAGNOSIS — I200 Unstable angina: Secondary | ICD-10-CM | POA: Diagnosis not present

## 2021-06-26 DIAGNOSIS — Z9013 Acquired absence of bilateral breasts and nipples: Secondary | ICD-10-CM

## 2021-06-26 DIAGNOSIS — E785 Hyperlipidemia, unspecified: Secondary | ICD-10-CM | POA: Diagnosis not present

## 2021-06-26 DIAGNOSIS — I10 Essential (primary) hypertension: Secondary | ICD-10-CM | POA: Diagnosis present

## 2021-06-26 DIAGNOSIS — R002 Palpitations: Secondary | ICD-10-CM | POA: Diagnosis not present

## 2021-06-26 DIAGNOSIS — R531 Weakness: Secondary | ICD-10-CM | POA: Diagnosis not present

## 2021-06-26 DIAGNOSIS — Z79899 Other long term (current) drug therapy: Secondary | ICD-10-CM

## 2021-06-26 DIAGNOSIS — Z883 Allergy status to other anti-infective agents status: Secondary | ICD-10-CM

## 2021-06-26 DIAGNOSIS — F419 Anxiety disorder, unspecified: Secondary | ICD-10-CM | POA: Diagnosis present

## 2021-06-26 DIAGNOSIS — Z888 Allergy status to other drugs, medicaments and biological substances status: Secondary | ICD-10-CM

## 2021-06-26 DIAGNOSIS — I248 Other forms of acute ischemic heart disease: Secondary | ICD-10-CM | POA: Diagnosis not present

## 2021-06-26 DIAGNOSIS — R079 Chest pain, unspecified: Secondary | ICD-10-CM | POA: Diagnosis not present

## 2021-06-26 DIAGNOSIS — M797 Fibromyalgia: Secondary | ICD-10-CM | POA: Diagnosis not present

## 2021-06-26 LAB — COMPREHENSIVE METABOLIC PANEL
ALT: 16 U/L (ref 0–44)
AST: 19 U/L (ref 15–41)
Albumin: 3.8 g/dL (ref 3.5–5.0)
Alkaline Phosphatase: 66 U/L (ref 38–126)
Anion gap: 5 (ref 5–15)
BUN: 21 mg/dL (ref 8–23)
CO2: 24 mmol/L (ref 22–32)
Calcium: 9.2 mg/dL (ref 8.9–10.3)
Chloride: 110 mmol/L (ref 98–111)
Creatinine, Ser: 0.87 mg/dL (ref 0.44–1.00)
GFR, Estimated: >60 mL/min (ref >60)
Glucose, Bld: 104 mg/dL — ABNORMAL HIGH (ref 70–99)
Potassium: 4.1 mmol/L (ref 3.5–5.1)
Sodium: 139 mmol/L (ref 135–145)
Total Bilirubin: 0.3 mg/dL (ref 0.3–1.2)
Total Protein: 6.3 g/dL — ABNORMAL LOW (ref 6.5–8.1)

## 2021-06-26 LAB — CBC WITH DIFFERENTIAL/PLATELET
Abs Immature Granulocytes: 0.02 10*3/uL (ref 0.00–0.07)
Basophils Absolute: 0 10*3/uL (ref 0.0–0.1)
Basophils Relative: 0 %
Eosinophils Absolute: 0.2 10*3/uL (ref 0.0–0.5)
Eosinophils Relative: 2 %
HCT: 35.9 % — ABNORMAL LOW (ref 36.0–46.0)
Hemoglobin: 12 g/dL (ref 12.0–15.0)
Immature Granulocytes: 0 %
Lymphocytes Relative: 20 %
Lymphs Abs: 1.7 10*3/uL (ref 0.7–4.0)
MCH: 30.3 pg (ref 26.0–34.0)
MCHC: 33.4 g/dL (ref 30.0–36.0)
MCV: 90.7 fL (ref 80.0–100.0)
Monocytes Absolute: 0.5 10*3/uL (ref 0.1–1.0)
Monocytes Relative: 6 %
Neutro Abs: 5.9 10*3/uL (ref 1.7–7.7)
Neutrophils Relative %: 72 %
Platelets: 222 10*3/uL (ref 150–400)
RBC: 3.96 MIL/uL (ref 3.87–5.11)
RDW: 13.3 % (ref 11.5–15.5)
WBC: 8.4 10*3/uL (ref 4.0–10.5)
nRBC: 0 % (ref 0.0–0.2)

## 2021-06-26 LAB — TROPONIN I (HIGH SENSITIVITY)
Troponin I (High Sensitivity): 6 ng/L (ref <18)
Troponin I (High Sensitivity): 7 ng/L (ref <18)

## 2021-06-26 LAB — PROTIME-INR
INR: 1 (ref 0.8–1.2)
Prothrombin Time: 13.3 seconds (ref 11.4–15.2)

## 2021-06-26 LAB — APTT: aPTT: 35 seconds (ref 24–36)

## 2021-06-26 MED ORDER — MECLIZINE HCL 25 MG PO TABS: 25.0000 mg | ORAL_TABLET | Freq: Three times a day (TID) | ORAL | Status: DC | PRN

## 2021-06-26 MED ORDER — METOPROLOL TARTRATE 12.5 MG HALF TABLET
12.5000 mg | ORAL_TABLET | Freq: Every day | ORAL | Status: DC
  Administered 2021-06-27: 12.5 mg via ORAL
  Filled 2021-06-26: qty 1

## 2021-06-26 MED ORDER — ZOLPIDEM TARTRATE 5 MG PO TABS
5.0000 mg | ORAL_TABLET | Freq: Every evening | ORAL | Status: AC | PRN
  Administered 2021-06-26: 5 mg via ORAL
  Filled 2021-06-26: qty 1

## 2021-06-26 MED ORDER — ASPIRIN 81 MG PO CHEW: 324.0000 mg | CHEWABLE_TABLET | ORAL | Status: AC

## 2021-06-26 MED ORDER — ONDANSETRON HCL 4 MG/2ML IJ SOLN: 4.0000 mg | Freq: Four times a day (QID) | INTRAMUSCULAR | Status: DC | PRN

## 2021-06-26 MED ORDER — NITROGLYCERIN 0.4 MG SL SUBL: 0.4000 mg | SUBLINGUAL_TABLET | SUBLINGUAL | Status: DC | PRN

## 2021-06-26 MED ORDER — ASPIRIN 300 MG RE SUPP
300.0000 mg | RECTAL | Status: AC
  Filled 2021-06-26: qty 1

## 2021-06-26 MED ORDER — ROSUVASTATIN CALCIUM 5 MG PO TABS
5.0000 mg | ORAL_TABLET | Freq: Every day | ORAL | Status: DC
  Filled 2021-06-26 (×2): qty 1

## 2021-06-26 MED ORDER — ASPIRIN EC 81 MG PO TBEC
81.0000 mg | DELAYED_RELEASE_TABLET | Freq: Every day | ORAL | Status: DC
  Administered 2021-06-27 – 2021-06-28 (×2): 81 mg via ORAL
  Filled 2021-06-26 (×3): qty 1

## 2021-06-26 MED ORDER — AMLODIPINE BESYLATE 2.5 MG PO TABS
2.5000 mg | ORAL_TABLET | Freq: Every morning | ORAL | Status: DC
  Administered 2021-06-27 – 2021-06-28 (×2): 2.5 mg via ORAL
  Filled 2021-06-26 (×3): qty 1

## 2021-06-26 MED ORDER — VITAMIN B-12 1000 MCG PO TABS
1000.0000 ug | ORAL_TABLET | Freq: Every day | ORAL | Status: DC
  Administered 2021-06-27 – 2021-06-28 (×2): 1000 ug via ORAL
  Filled 2021-06-26 (×3): qty 1

## 2021-06-26 MED ORDER — SODIUM CHLORIDE 0.9 % IV SOLN: INTRAVENOUS | Status: DC

## 2021-06-26 MED ORDER — ACETAMINOPHEN 325 MG PO TABS: 650.0000 mg | ORAL_TABLET | ORAL | Status: DC | PRN

## 2021-06-26 MED ORDER — HEPARIN BOLUS VIA INFUSION
3000.0000 [IU] | Freq: Once | INTRAVENOUS | Status: AC
  Administered 2021-06-26: 3000 [IU] via INTRAVENOUS
  Filled 2021-06-26: qty 3000

## 2021-06-26 MED ORDER — POLYVINYL ALCOHOL 1.4 % OP SOLN
1.0000 [drp] | Freq: Every day | OPHTHALMIC | Status: DC
  Filled 2021-06-26: qty 15

## 2021-06-26 MED ORDER — HEPARIN (PORCINE) 25000 UT/250ML-% IV SOLN
900.0000 [IU]/h | INTRAVENOUS | Status: DC
  Administered 2021-06-26: 650 [IU]/h via INTRAVENOUS
  Administered 2021-06-28: 900 [IU]/h via INTRAVENOUS
  Filled 2021-06-26 (×2): qty 250
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
## 2021-06-27 ENCOUNTER — Inpatient Hospital Stay (HOSPITAL_COMMUNITY): Payer: Medicare HMO

## 2021-06-27 DIAGNOSIS — I248 Other forms of acute ischemic heart disease: Secondary | ICD-10-CM

## 2021-06-27 LAB — BASIC METABOLIC PANEL
Anion gap: 6 (ref 5–15)
BUN: 17 mg/dL (ref 8–23)
CO2: 21 mmol/L — ABNORMAL LOW (ref 22–32)
Calcium: 8.8 mg/dL — ABNORMAL LOW (ref 8.9–10.3)
Chloride: 113 mmol/L — ABNORMAL HIGH (ref 98–111)
Creatinine, Ser: 0.84 mg/dL (ref 0.44–1.00)
GFR, Estimated: >60 mL/min (ref >60)
Glucose, Bld: 89 mg/dL (ref 70–99)
Potassium: 3.9 mmol/L (ref 3.5–5.1)
Sodium: 140 mmol/L (ref 135–145)

## 2021-06-27 LAB — ECHOCARDIOGRAM COMPLETE
AR max vel: 2.24 cm2
AV Area VTI: 3.58 cm2
AV Area mean vel: 2.07 cm2
AV Mean grad: 6 mmHg
AV Peak grad: 10.8 mmHg
Ao pk vel: 1.64 m/s
Area-P 1/2: 6.47 cm2
Height: 60 in
MV M vel: 6.7 m/s
MV Peak grad: 179.6 mmHg
S' Lateral: 2.4 cm
Single Plane A4C EF: 61.4 %
Weight: 1896 oz

## 2021-06-27 LAB — CBC
HCT: 34.4 % — ABNORMAL LOW (ref 36.0–46.0)
Hemoglobin: 11.4 g/dL — ABNORMAL LOW (ref 12.0–15.0)
MCH: 30.2 pg (ref 26.0–34.0)
MCHC: 33.1 g/dL (ref 30.0–36.0)
MCV: 91 fL (ref 80.0–100.0)
Platelets: 191 10*3/uL (ref 150–400)
RBC: 3.78 MIL/uL — ABNORMAL LOW (ref 3.87–5.11)
RDW: 13.4 % (ref 11.5–15.5)
WBC: 6.6 10*3/uL (ref 4.0–10.5)
nRBC: 0 % (ref 0.0–0.2)

## 2021-06-27 LAB — LIPID PANEL
Cholesterol: 297 mg/dL — ABNORMAL HIGH (ref 0–200)
HDL: 39 mg/dL — ABNORMAL LOW (ref >40)
LDL Cholesterol: 222 mg/dL — ABNORMAL HIGH (ref 0–99)
Total CHOL/HDL Ratio: 7.6 RATIO
Triglycerides: 179 mg/dL — ABNORMAL HIGH (ref <150)
VLDL: 36 mg/dL (ref 0–40)

## 2021-06-27 LAB — HEPARIN LEVEL (UNFRACTIONATED): Heparin Unfractionated: 0.19 IU/mL — ABNORMAL LOW (ref 0.30–0.70)

## 2021-06-27 MED ORDER — LORAZEPAM 1 MG PO TABS
1.0000 mg | ORAL_TABLET | Freq: Once | ORAL | Status: AC
  Administered 2021-06-27: 1 mg via ORAL
  Filled 2021-06-27: qty 1

## 2021-06-27 MED ORDER — QUETIAPINE FUMARATE 25 MG PO TABS
25.0000 mg | ORAL_TABLET | Freq: Every day | ORAL | Status: DC
Start: 2021-06-27 — End: 2021-06-28
  Administered 2021-06-27: 25 mg via ORAL
  Filled 2021-06-27: qty 1

## 2021-06-27 MED ORDER — REGADENOSON 0.4 MG/5ML IV SOLN
0.4000 mg | Freq: Once | INTRAVENOUS | Status: AC
  Filled 2021-06-27: qty 5

## 2021-06-27 MED ORDER — REGADENOSON 0.4 MG/5ML IV SOLN
INTRAVENOUS | Status: AC
  Administered 2021-06-27: 0.4 mg via INTRAVENOUS
  Filled 2021-06-27: qty 5

## 2021-06-27 MED ORDER — TECHNETIUM TC 99M TETROFOSMIN IV KIT
10.2000 | PACK | Freq: Once | INTRAVENOUS | Status: AC | PRN
  Administered 2021-06-27: 10.2 via INTRAVENOUS

## 2021-06-27 NOTE — Progress Notes (Signed)
[place] for heparin Indication: chest pain/ACS  Allergies  Allergen Reactions   Chlorpromazine     Other reaction(s): Other Restlessness   Gentian Violet Itching   Halcion  [Triazolam]     Other reaction(s): Other Restlessness   Remeron  [Mirtazapine]     Other reaction(s): Other restlessness   Trifluoperazine     Other reaction(s): Other Restlessness   Abilify [Aripiprazole] Other (See Comments)    Makes her jump and jittery   Clarithromycin Other (See Comments)    Cant remember, terrible feeling   Cleocin [Clindamycin Hcl] Other (See Comments)    Weakness   Compazine [Prochlorperazine Edisylate] Nausea And Vomiting   Epinephrine Other (See Comments)    Made her go crazy and jittery    Hyoscyamine Sulfate Diarrhea   Trazodone Other (See Comments)    Jittery     Patient Measurements: Height: 5' (152.4 cm) Weight: 53.8 kg (118 lb 8 oz) IBW/kg (Calculated) : 45.5 Heparin Dosing Weight: TBW  Vital Signs: Temp: 97.9 F (36.6 C) (09/08 0313) Temp Source: Oral (09/08 0313) BP: 151/50 (09/08 0313) Pulse Rate: 50 (09/08 0313)  Labs: Recent Labs    06/26/21 1820 06/26/21 2028 06/27/21 0601  HGB 12.0  --   --   HCT 35.9*  --   --   PLT 222  --   --   APTT 35  --   --   LABPROT 13.3  --   --   INR 1.0  --   --   HEPARINUNFRC  --   --  0.19*  CREATININE 0.87  --   --   TROPONINIHS 6 7  --      Estimated Creatinine Clearance: 34.6 mL/min (by C-G formula based on SCr of 0.87 mg/dL).   Assessment: 86 y.o. female with with SOB and DOE, possible ACS, for heparin  Goal of Therapy:  Heparin level 0.3-0.7 units/ml Monitor platelets by anticoagulation protocol: Yes   Plan:  Increase Heparin 800 units/hr F/U after stress test  Phillis Knack, PharmD, BCPS

## 2021-06-27 NOTE — Progress Notes (Signed)
Ref: Dixie Dials, MD   Subjective:  Awake. Finished nuclear stress test. EKG equivocal, chest pain negative stress test. Patient going through mental stress, she lives alone, wants someone to take care of her yet refuses nursing home placement.  Objective:  Vital Signs in the last 24 hours: Temp:  [97.9 F (36.6 C)-98.8 F (37.1 C)] 97.9 F (36.6 C) (09/08 0313) Pulse Rate:  [50-88] 77 (09/08 1216) Cardiac Rhythm: Sinus bradycardia;Normal sinus rhythm;Bundle branch block (09/08 0903) Resp:  [13-20] 20 (09/08 1056) BP: (136-182)/(50-76) 182/67 (09/08 1326) SpO2:  [98 %-99 %] 98 % (09/08 0750) Weight:  [53.8 kg] 53.8 kg (09/07 1732)  Physical Exam: BP Readings from Last 1 Encounters:  06/27/21 (!) 182/67     Wt Readings from Last 1 Encounters:  06/26/21 53.8 kg    Weight change:  Body mass index is 23.14 kg/m. HEENT: /AT, Eyes-Blue, Conjunctiva-Pink, Sclera-Non-icteric Neck: No JVD, No bruit, Trachea midline. Lungs:  Clear, Bilateral. Cardiac:  Regular rhythm, normal S1 and S2, no S3. II/VI systolic murmur. Abdomen:  Soft, non-tender. BS present. Extremities:  No edema present. No cyanosis. No clubbing. CNS: AxOx3, Cranial nerves grossly intact, moves all 4 extremities.  Skin: Warm and dry.   Intake/Output from previous day: 09/07 0701 - 09/08 0700 In: 760.1 [P.O.:480; I.V.:280.1] Out: 450 [Urine:450]    Lab Results: BMET    Component Value Date/Time   NA 140 06/27/2021 0601   NA 139 06/26/2021 1820   NA 140 06/24/2015 1247   NA 137 03/13/2015 0853   NA 138 03/06/2015 1049   NA 138 02/27/2015 1015   K 3.9 06/27/2021 0601   K 4.1 06/26/2021 1820   K 3.7 06/24/2015 1247   K 4.2 03/13/2015 0853   K 4.0 03/06/2015 1049   K 4.3 02/27/2015 1015   CL 113 (H) 06/27/2021 0601   CL 110 06/26/2021 1820   CL 107 06/24/2015 1247   CO2 21 (L) 06/27/2021 0601   CO2 24 06/26/2021 1820   CO2 26 06/24/2015 1247   CO2 23 03/13/2015 0853   CO2 23 03/06/2015 1049    CO2 21 (L) 02/27/2015 1015   GLUCOSE 89 06/27/2021 0601   GLUCOSE 104 (H) 06/26/2021 1820   GLUCOSE 107 (H) 06/24/2015 1247   GLUCOSE 104 03/13/2015 0853   GLUCOSE 106 03/06/2015 1049   GLUCOSE 106 02/27/2015 1015   BUN 17 06/27/2021 0601   BUN 21 06/26/2021 1820   BUN 10 06/24/2015 1247   BUN 10.7 03/13/2015 0853   BUN 13.8 03/06/2015 1049   BUN 14.2 02/27/2015 1015   CREATININE 0.84 06/27/2021 0601   CREATININE 0.87 06/26/2021 1820   CREATININE 0.75 06/24/2015 1247   CREATININE 0.8 03/13/2015 0853   CREATININE 0.8 03/06/2015 1049   CREATININE 1.0 02/27/2015 1015   CALCIUM 8.8 (L) 06/27/2021 0601   CALCIUM 9.2 06/26/2021 1820   CALCIUM 9.4 06/24/2015 1247   CALCIUM 9.3 03/13/2015 0853   CALCIUM 9.3 03/06/2015 1049   CALCIUM 9.1 02/27/2015 1015   GFRNONAA >60 06/27/2021 0601   GFRNONAA >60 06/26/2021 1820   GFRNONAA >60 06/24/2015 1247   GFRNONAA 60 (L) 05/28/2015 1613   GFRNONAA >60 05/02/2015 1049   GFRAA >60 06/24/2015 1247   GFRAA >60 05/28/2015 1613   GFRAA >60 05/02/2015 1049   CBC    Component Value Date/Time   WBC 6.6 06/27/2021 0601   RBC 3.78 (L) 06/27/2021 0601   HGB 11.4 (L) 06/27/2021 0601   HGB 13.0 03/13/2015 [id number]  HCT 34.4 (L) 06/27/2021 0601   HCT 28.9 (L) 03/21/2015 1236   HCT 38.0 03/13/2015 0852   PLT 191 06/27/2021 0601   PLT 208 03/13/2015 0852   MCV 91.0 06/27/2021 0601   MCV 86.8 03/13/2015 0852   MCH 30.2 06/27/2021 0601   MCHC 33.1 06/27/2021 0601   RDW 13.4 06/27/2021 0601   RDW 14.0 03/13/2015 0852   LYMPHSABS 1.7 06/26/2021 1820   LYMPHSABS 0.9 03/13/2015 0852   MONOABS 0.5 06/26/2021 1820   MONOABS 0.5 03/13/2015 0852   EOSABS 0.2 06/26/2021 1820   EOSABS 0.0 03/13/2015 0852   BASOSABS 0.0 06/26/2021 1820   BASOSABS 0.0 03/13/2015 0852   HEPATIC Function Panel Recent Labs    06/26/21 1820  PROT 6.3*   HEMOGLOBIN A1C No components found for: HGA1C,  MPG CARDIAC ENZYMES No results found for: CKTOTAL, CKMB, CKMBINDEX,  TROPONINI BNP No results for input(s): PROBNP in the last 8760 hours. TSH No results for input(s): TSH in the last 8760 hours. CHOLESTEROL Recent Labs    06/27/21 0601  CHOL 297*    Scheduled Meds:  amLODipine  2.5 mg Oral q morning   aspirin  324 mg Oral NOW   Or   aspirin  300 mg Rectal NOW   aspirin EC  81 mg Oral Daily   metoprolol tartrate  12.5 mg Oral QHS   polyvinyl alcohol  1 drop Both Eyes QHS   rosuvastatin  5 mg Oral Daily   vitamin B-12  1,000 mcg Oral Daily   Continuous Infusions:  sodium chloride 30 mL/hr at 06/26/21 2145   heparin 800 Units/hr (06/27/21 0723)   PRN Meds:.acetaminophen, meclizine, nitroGLYCERIN, ondansetron (ZOFRAN) IV  Assessment/Plan:  Unstable angina HTN HLD Anxiety and depression S/P Breast cancer  Plan: Discuss stress test findings with patient. Social service consult for home health assistance. Start low dose Seroquel. May need OP psych follow up as in past.   LOS: 1 day   Time spent including chart review, lab review, examination, discussion with patient : 30 min   Dixie Dials  MD  06/27/2021, 4:17 PM

## 2021-06-28 DIAGNOSIS — I200 Unstable angina: Principal | ICD-10-CM

## 2021-06-28 DIAGNOSIS — F419 Anxiety disorder, unspecified: Secondary | ICD-10-CM

## 2021-06-28 LAB — CBC
HCT: 34.5 % — ABNORMAL LOW (ref 36.0–46.0)
Hemoglobin: 11.5 g/dL — ABNORMAL LOW (ref 12.0–15.0)
MCH: 30.4 pg (ref 26.0–34.0)
MCHC: 33.3 g/dL (ref 30.0–36.0)
MCV: 91.3 fL (ref 80.0–100.0)
Platelets: 186 10*3/uL (ref 150–400)
RBC: 3.78 MIL/uL — ABNORMAL LOW (ref 3.87–5.11)
RDW: 13.5 % (ref 11.5–15.5)
WBC: 6.8 10*3/uL (ref 4.0–10.5)
nRBC: 0 % (ref 0.0–0.2)

## 2021-06-28 LAB — HEPARIN LEVEL (UNFRACTIONATED): Heparin Unfractionated: 0.2 IU/mL — ABNORMAL LOW (ref 0.30–0.70)

## 2021-06-28 MED ORDER — ASCORBIC ACID 250 MG PO CHEW: 500.0000 mg | CHEWABLE_TABLET | Freq: Every day | ORAL | Status: DC

## 2021-06-28 MED ORDER — QUETIAPINE FUMARATE 25 MG PO TABS: 25.0000 mg | ORAL_TABLET | Freq: Every day | ORAL | 3 refills | Status: AC

## 2021-06-28 MED ORDER — ROSUVASTATIN CALCIUM 5 MG PO TABS: 5.0000 mg | ORAL_TABLET | Freq: Every day | ORAL | 1 refills | Status: AC

## 2021-06-28 MED ORDER — QUETIAPINE FUMARATE 25 MG PO TABS: 25.0000 mg | ORAL_TABLET | Freq: Every day | ORAL | Status: DC

## 2021-06-28 MED ORDER — HEPARIN SODIUM (PORCINE) 5000 UNIT/ML IJ SOLN: 5000.0000 [IU] | Freq: Three times a day (TID) | INTRAMUSCULAR | Status: DC

## 2021-06-28 MED ORDER — ANASTROZOLE 1 MG PO TABS
1.0000 mg | ORAL_TABLET | Freq: Every day | ORAL | Status: DC
  Administered 2021-06-28: 1 mg via ORAL
  Filled 2021-06-28: qty 1

## 2021-06-28 MED ORDER — CLOPIDOGREL BISULFATE 75 MG PO TABS
75.0000 mg | ORAL_TABLET | Freq: Every day | ORAL | Status: DC
  Administered 2021-06-28: 75 mg via ORAL
  Filled 2021-06-28: qty 1

## 2021-06-28 MED ORDER — ALUM & MAG HYDROXIDE-SIMETH 200-200-20 MG/5ML PO SUSP: 30.0000 mL | ORAL | Status: DC | PRN

## 2021-06-28 MED ORDER — NITROGLYCERIN 0.4 MG SL SUBL: 0.4000 mg | SUBLINGUAL_TABLET | SUBLINGUAL | 1 refills | Status: AC | PRN

## 2021-06-28 MED ORDER — CLOPIDOGREL BISULFATE 75 MG PO TABS: 75.0000 mg | ORAL_TABLET | Freq: Every day | ORAL | 3 refills | Status: AC

## 2021-06-28 MED ORDER — ASCORBIC ACID 500 MG PO TABS
500.0000 mg | ORAL_TABLET | Freq: Every day | ORAL | Status: DC
  Administered 2021-06-28: 500 mg via ORAL
  Filled 2021-06-28: qty 1

## 2021-06-28 NOTE — Consult Note (Signed)
  86 years old white female with PMH of HTN, Breast cancer, s/p bilateral mastectomy, Hyperlipidemia, Anxiety and chronic fatigue syndrome has exertional shortness of breath with sweating spells and palpitations lasting for few minutes.  Psych consult was placed for: Stressed out post nuclear stress test, yelling" I need help at home" needed lorazepam to calm down and started Seroquel 25 mg nightly.  Calm this morning but prefer psych consult.  And follow-up outpatient.  Patient is seen and assessed she is alert and oriented, calm and cooperative, and pleasant with this nurse practitioner.  Writer introduced self and reason for visit.  Patient does admit to being stressed and needing assistance at home, however she denies any need for any current inpatient psychiatric services or outpatient psychiatric services.  She reports she is able to control her emotions and mood, and does not need any psychotropic medication.  She denies having any previous existing psychiatric diagnosis.  She reports her outburst yesterday, was due to stress and anxiety and it resolved with administration of the medication.  Patient does admit that she would benefit from speaking with a therapist.    Per chart review patient appears to have received lorazepam 1 mg and a single dose, and started Seroquel 25 mg p.o. nightly. Patient is advised that we strongly recommend not continuing lorazepam daily for her anxiety as this medication can cause worsening problems and cognitive impairment. Chart review shows patient is EKG obtained on today's date, QTC of 526.  Although the Seroquel appears to be beneficial in treating her temper tantrums and mood outbursts, would also not recommend continuing this medication at this time.  -We will recommend patient follow up with outpatient psychiatry.  Preferably [facility] and or [facility] outpatient [facility] Dr. Adele Schilder and [facility] Nurse specialized in geriatric psychiatry.  Consider TOC consult for  above referral. -We will also discontinue Seroquel, due to prolonged QTC.  At this time do not recommend any additional initiation of antipsychotics at this time.  If patient continues to exhibit mood swings and mood lability will recommend a mood stabilizer in an outpatient setting. -Psychiatry to sign off at this time.

## 2021-06-28 NOTE — Discharge Summary (Signed)
Physician Discharge Summary  Patient ID: Grace Paul MRN: [id number] DOB/AGE: Mar 06, 1935 86 y.o.  Admit date: 06/26/2021 Discharge date: 06/28/2021  Admission Diagnoses: Unstable angina HTN HLD Anxiety S/P Breast cancer  Discharge Diagnoses:  Principal Problem:   Unstable angina ([place]) Active Problems:   Palpitation   Essential hypertension   Anxiety   Shortness of breath   Hyperlipidemia   MR   TR   Senile dementia   S/P breast cancer with surgery   Weakness   Sinus bradycardia   Left bundle branch block  Discharged Condition: fair  Hospital Course: 86 years old white female with PMH of HTN, HLD, Anxiety, Chronic fatigue syndrome, bilateral breast cancer with surgery had exertional shortness of breath with sweating spells and palpitations. Her Troponin I levels were normal. She underwent nuclear stress test showing inferior wall scar and basal anterior wall ischemia. Patient prefers to continue medical therapy for now and considering her advanced age and increased fear of cardiac catheterization this is probably best for her now. She had one episode of manic behavior post stress test and has responded to Seroquel well. Her QTc on EKG is falsely measured as 526 when it is less than 470 ms. Hence will continue the medication along with addition of Plavix for unstable angina and Crestor for hyperlipidemia. Betablocker was held for episodes of sinus bradycardia in 40's. Discharged in stable condition with psych follow up and Nurses aid to assist at home and follow up with me in 5 days.  Consults: cardiology and psychiatry  Significant Diagnostic Studies: labs: Near normal CBC, Comprehensive metabolic panel, Troponin-I levels.  EKG: SR, LBBB.  NM myocardial perfusion stress test: Inferior scar and basilar anterior wall ischemia, low risk stress test.  Echocardiogram: NormalLV systolic function, mild LVH, moderate pulmonary systolic hypertension, mild to moderate mitral valve  regurgitation and mild TR.  Treatments: cardiac meds: amlodipine and Aspirin, Plavix, Crestor.  Discharge Exam: Blood pressure (!) 168/91, pulse 84, temperature 98 F (36.7 C), temperature source Oral, resp. rate 16, height 5' (1.524 m), weight 53.8 kg, SpO2 94 %. General appearance: alert, cooperative and appears stated age. Head: Normocephalic, atraumatic. Eyes: Blue eyes, pink conjunctiva, corneas clear. PERRL, EOM's intact.  Neck: No adenopathy, no carotid bruit, no JVD, supple, symmetrical, trachea midline and thyroid not enlarged. Resp: Clear to auscultation bilaterally. Cardio: Regular rate and rhythm, S1, S2 normal, II/VI systolic murmur, no click, rub or gallop. GI: Soft, non-tender; bowel sounds normal; no organomegaly. Extremities: No edema, cyanosis or clubbing. Skin: Warm and dry.  Neurologic: Alert and oriented X 3, normal strength and tone. Normal coordination and gait.   Disposition: Discharge disposition: 01-Home or Self Care        Allergies as of 06/28/2021       Reactions   Chlorpromazine    Other reaction(s): Other Restlessness   Gentian Violet Itching   Halcion  [triazolam]    Other reaction(s): Other Restlessness   Remeron  [mirtazapine]    Other reaction(s): Other restlessness   Trifluoperazine    Other reaction(s): Other Restlessness   Abilify [aripiprazole] Other (See Comments)   Makes her jump and jittery   Clarithromycin Other (See Comments)   Cant remember, terrible feeling   Cleocin [clindamycin Hcl] Other (See Comments)   Weakness   Compazine [prochlorperazine Edisylate] Nausea And Vomiting   Epinephrine Other (See Comments)   Made her go crazy and jittery    Hyoscyamine Sulfate Diarrhea   Trazodone Other (See Comments)   Jittery  Medication List     STOP taking these medications    erythromycin ophthalmic ointment   metoprolol succinate 25 MG 24 hr tablet Commonly known as: TOPROL-XL       TAKE these  medications    acetaminophen 325 MG tablet Commonly known as: TYLENOL Take 650 mg by mouth every 6 (six) hours as needed for moderate pain.   amLODipine 2.5 MG tablet Commonly known as: NORVASC Take 2.5 mg by mouth every morning.   anastrozole 1 MG tablet Commonly known as: ARIMIDEX Take 1 tablet (1 mg total) by mouth daily.   ascorbic acid 250 MG Chew Commonly known as: VITAMIN C Chew 500 mg by mouth daily.   aspirin 81 MG tablet Take 81 mg by mouth daily.   clopidogrel 75 MG tablet Commonly known as: PLAVIX Take 1 tablet (75 mg total) by mouth daily. Start taking on: June 29, 2021   HYDROcodone-acetaminophen 5-325 MG tablet Commonly known as: NORCO/VICODIN Take 1 tablet by mouth every other day.   meclizine 25 MG tablet Commonly known as: ANTIVERT Take 1 tablet (25 mg total) by mouth 3 (three) times daily as needed. What changed: reasons to take this   neomycin-polymyxin-hydrocortisone 3.5-10000-1 OTIC suspension Commonly known as: CORTISPORIN Place 1 drop into both ears 3 (three) times daily as needed (infection prevention). What changed: Another medication with the same name was removed. Continue taking this medication, and follow the directions you see here.   nitroGLYCERIN 0.4 MG SL tablet Commonly known as: NITROSTAT Place 1 tablet (0.4 mg total) under the tongue every 5 (five) minutes x 3 doses as needed for chest pain.   Propylene Glycol 0.6 % Soln Place 1 drop into both eyes at bedtime as needed (dry eyes).   QUEtiapine 25 MG tablet Commonly known as: SEROQUEL Take 1 tablet (25 mg total) by mouth at bedtime.   rosuvastatin 5 MG tablet Commonly known as: CRESTOR Take 1 tablet (5 mg total) by mouth daily. Start taking on: June 29, 2021        Follow-up Information     Dixie Dials, MD Follow up in 5 day(s).   Specialty: Cardiology Contact information: [place] [address] [phone]                  Time spent: Review of old chart, current chart, lab, x-ray, cardiac tests and discussion with patient over 60 minutes.  Signed: Birdie Riddle 06/28/2021, 1:45 PM

## 2021-06-28 NOTE — Progress Notes (Signed)
[place] for heparin Indication: chest pain/ACS  Allergies  Allergen Reactions   Chlorpromazine     Other reaction(s): Other Restlessness   Gentian Violet Itching   Halcion  [Triazolam]     Other reaction(s): Other Restlessness   Remeron  [Mirtazapine]     Other reaction(s): Other restlessness   Trifluoperazine     Other reaction(s): Other Restlessness   Abilify [Aripiprazole] Other (See Comments)    Makes her jump and jittery   Clarithromycin Other (See Comments)    Cant remember, terrible feeling   Cleocin [Clindamycin Hcl] Other (See Comments)    Weakness   Compazine [Prochlorperazine Edisylate] Nausea And Vomiting   Epinephrine Other (See Comments)    Made her go crazy and jittery    Hyoscyamine Sulfate Diarrhea   Trazodone Other (See Comments)    Jittery     Patient Measurements: Height: 5' (152.4 cm) Weight: 53.8 kg (118 lb 8 oz) IBW/kg (Calculated) : 45.5 Heparin Dosing Weight: TBW  Vital Signs: BP: 163/80 (09/08 2117) Pulse Rate: 70 (09/08 2117)  Labs: Recent Labs    06/26/21 1820 06/26/21 2028 06/27/21 0601 06/28/21 0242  HGB 12.0  --  11.4* 11.5*  HCT 35.9*  --  34.4* 34.5*  PLT 222  --  191 186  APTT 35  --   --   --   LABPROT 13.3  --   --   --   INR 1.0  --   --   --   HEPARINUNFRC  --   --  0.19* 0.20*  CREATININE 0.87  --  0.84  --   TROPONINIHS 6 7  --   --      Estimated Creatinine Clearance: 35.8 mL/min (by C-G formula based on SCr of 0.84 mg/dL).   Assessment: 86 y.o. female with with SOB and DOE, possible ACS, for heparin  Heparin continues, level low again this morning. Negative stress test, may be able to stop heparin today.   Goal of Therapy:  Heparin level 0.3-0.7 units/ml Monitor platelets by anticoagulation protocol: Yes   Plan:  Increase Heparin 900 units/hr F/U cards plan  Erin Hearing PharmD., BCPS Clinical Pharmacist 06/28/2021 4:09 AM

## 2021-06-28 NOTE — Care Management (Signed)
06-28-21 1429 Case Manager received a notification that the patient will benefit from home health services. Patient is agreeable to home health services and she does not have a preference for an agency. Case Manager scheduled the patient with [facility] for Services. The agency will call the patient with visit times. Disciplines include Registered Nurse for medication/disease management, Aide for bath, and CSW to assist with Medicaid to see if the patient can get an aide in the home via Medicaid for light house keeping and errands. No further needs from Case Manager at this time.

## 2021-06-28 NOTE — Discharge Instructions (Signed)
If you are in need of a shelter please call [facility] ([place]) at [phone] between the hours of 9am-5pm Mon-Fri.  [facility] will contact all the local shelters to find openings.  Right now they are requiring people to quarantine at a hotel before they can go to an open bed but [facility] may be able to set that up as well.  They are not open on weekends.  On Monday-Friday morning at 8 am until 1 pm, you can also go to the [employer] (see below) to seek shelter [facility] the [facility] prior to entering a shelter. You can also call the number provided (see the above paragraph) to seek placement into the program by calling [facility].   [facility]) [place], [place] [address] Phone: [phone] Fax: [phone]  For Free Breakfasts and Lunches 7 days a week you can go to: [facility] [address] [place], [place], [place] [address] Hours: Open today  8AM-5PM Phone: [phone] Breakfast: 6:30-7:30 am Lunch served: 10:40 am - 12:40pm         [facility]) M-F 8am-3pm   [address], [place] [address]   [phone] Services include: laundry, barbering, support groups, case management, phone  & computer access, showers, AA/NA mtgs, mental health/substance abuse nurse, job skills class, disability information, VA assistance, spiritual classes, etc.   [facility] [place], [place] Provides breakfast each weekday morning except Wednesdays, and an evening community meal every Friday. Access to showers is available during breakfast hours and telephones for seeking work are also provided. Also offers job referral and counseling for the homeless and unemployed.  Call 211 for services in your county. [facility] [phone]. [place], [place] [address]     [place],  [address]    [facility] Mobile Crisis Management - [phone]

## 2021-06-28 NOTE — TOC Progression Note (Signed)
Transition of Care [facility]) - Progression Note    Patient Details  Name: Grace Paul MRN: [id number] Date of Birth: 04/18/1935  Transition of Care [facility]) CM/SW Contact  Coralee P[place], [place] Phone Number: 06/28/2021, 4:49 PM  Clinical Narrative:     CSW notified that pt is not wanting a taxi home and stating she has food insecurities. CSW offered a bus pass and some resources for food and financial assistance. CSW made an APS report with the hope they could follow in the community to continue to support this pt. Pt then declined taxi and bus pass and preferred to walk home. TOC will sign off at this time, community advocates will follow outpatient.       Expected Discharge Plan and Services           Expected Discharge Date: 06/28/21                                     Social Determinants of Health (SDOH) Interventions    Readmission Risk Interventions No flowsheet data found.

## 2021-06-28 NOTE — Progress Notes (Signed)
Ref: Dixie Dials, MD   Subjective:  Awake. Low risk stress test result with possible CAD, Old MI etc. Patient prefers medical treatment. She is also requesting psych consult. Post Lorazepam and Seroquel use calmer this AM. VS stable, Normal sinus rhythm on Monitor.  Objective:  Vital Signs in the last 24 hours: Temp:  [98.5 F (36.9 C)] 98.5 F (36.9 C) (09/09 0445) Pulse Rate:  [54-88] 64 (09/09 0445) Cardiac Rhythm: Sinus bradycardia (09/09 0700) Resp:  [19-20] 19 (09/09 0445) BP: (126-182)/(63-80) 126/70 (09/09 0916) SpO2:  [95 %] 95 % (09/09 0445)  Physical Exam: BP Readings from Last 1 Encounters:  06/28/21 126/70     Wt Readings from Last 1 Encounters:  06/26/21 53.8 kg    Weight change:  Body mass index is 23.14 kg/m. HEENT: [place]/AT, Eyes-Blue, Conjunctiva-Pink, Sclera-Non-icteric Neck: No JVD, No bruit, Trachea midline. Lungs:  Clear, Bilateral. Cardiac:  Regular rhythm, normal S1 and S2, no S3. II/VI systolic murmur. Abdomen:  Soft, non-tender. BS present. Extremities:  No edema present. No cyanosis. No clubbing. CNS: AxOx3, Cranial nerves grossly intact, moves all 4 extremities.  Skin: Warm and dry.   Intake/Output from previous day: No intake/output data recorded.    Lab Results: BMET    Component Value Date/Time   NA 140 06/27/2021 0601   NA 139 06/26/2021 1820   NA 140 06/24/2015 1247   NA 137 03/13/2015 0853   NA 138 03/06/2015 1049   NA 138 02/27/2015 1015   K 3.9 06/27/2021 0601   K 4.1 06/26/2021 1820   K 3.7 06/24/2015 1247   K 4.2 03/13/2015 0853   K 4.0 03/06/2015 1049   K 4.3 02/27/2015 1015   CL 113 (H) 06/27/2021 0601   CL 110 06/26/2021 1820   CL 107 06/24/2015 1247   CO2 21 (L) 06/27/2021 0601   CO2 24 06/26/2021 1820   CO2 26 06/24/2015 1247   CO2 23 03/13/2015 0853   CO2 23 03/06/2015 1049   CO2 21 (L) 02/27/2015 1015   GLUCOSE 89 06/27/2021 0601   GLUCOSE 104 (H) 06/26/2021 1820   GLUCOSE 107 (H) 06/24/2015 1247    GLUCOSE 104 03/13/2015 0853   GLUCOSE 106 03/06/2015 1049   GLUCOSE 106 02/27/2015 1015   BUN 17 06/27/2021 0601   BUN 21 06/26/2021 1820   BUN 10 06/24/2015 1247   BUN 10.7 03/13/2015 0853   BUN 13.8 03/06/2015 1049   BUN 14.2 02/27/2015 1015   CREATININE 0.84 06/27/2021 0601   CREATININE 0.87 06/26/2021 1820   CREATININE 0.75 06/24/2015 1247   CREATININE 0.8 03/13/2015 0853   CREATININE 0.8 03/06/2015 1049   CREATININE 1.0 02/27/2015 1015   CALCIUM 8.8 (L) 06/27/2021 0601   CALCIUM 9.2 06/26/2021 1820   CALCIUM 9.4 06/24/2015 1247   CALCIUM 9.3 03/13/2015 0853   CALCIUM 9.3 03/06/2015 1049   CALCIUM 9.1 02/27/2015 1015   GFRNONAA >60 06/27/2021 0601   GFRNONAA >60 06/26/2021 1820   GFRNONAA >60 06/24/2015 1247   GFRNONAA 60 (L) 05/28/2015 1613   GFRNONAA >60 05/02/2015 1049   GFRAA >60 06/24/2015 1247   GFRAA >60 05/28/2015 1613   GFRAA >60 05/02/2015 1049   CBC    Component Value Date/Time   WBC 6.8 06/28/2021 0242   RBC 3.78 (L) 06/28/2021 0242   HGB 11.5 (L) 06/28/2021 0242   HGB 13.0 03/13/2015 0852   HCT 34.5 (L) 06/28/2021 0242   HCT 28.9 (L) 03/21/2015 1236   HCT 38.0 03/13/2015 [id number]  PLT 186 06/28/2021 0242   PLT 208 03/13/2015 0852   MCV 91.3 06/28/2021 0242   MCV 86.8 03/13/2015 0852   MCH 30.4 06/28/2021 0242   MCHC 33.3 06/28/2021 0242   RDW 13.5 06/28/2021 0242   RDW 14.0 03/13/2015 0852   LYMPHSABS 1.7 06/26/2021 1820   LYMPHSABS 0.9 03/13/2015 0852   MONOABS 0.5 06/26/2021 1820   MONOABS 0.5 03/13/2015 0852   EOSABS 0.2 06/26/2021 1820   EOSABS 0.0 03/13/2015 0852   BASOSABS 0.0 06/26/2021 1820   BASOSABS 0.0 03/13/2015 0852   HEPATIC Function Panel Recent Labs    06/26/21 1820  PROT 6.3*   HEMOGLOBIN A1C No components found for: HGA1C,  MPG CARDIAC ENZYMES No results found for: CKTOTAL, CKMB, CKMBINDEX, TROPONINI BNP No results for input(s): PROBNP in the last 8760 hours. TSH No results for input(s): TSH in the last 8760  hours. CHOLESTEROL Recent Labs    06/27/21 0601  CHOL 297*    Scheduled Meds:  amLODipine  2.5 mg Oral q morning   aspirin EC  81 mg Oral Daily   metoprolol tartrate  12.5 mg Oral QHS   polyvinyl alcohol  1 drop Both Eyes QHS   QUEtiapine  25 mg Oral QHS   rosuvastatin  5 mg Oral Daily   vitamin B-12  1,000 mcg Oral Daily   Continuous Infusions:  sodium chloride 30 mL/hr at 06/28/21 0549   PRN Meds:.acetaminophen, alum & mag hydroxide-simeth, meclizine, nitroGLYCERIN, ondansetron (ZOFRAN) IV  Assessment/Plan: Unstable angina HTN HLD Anxiety and depression with manic episode x 1 S/P breast cancer with surgery  Plan: DC IV heparin. Add Plavix. Psych consult. Increase activity.   LOS: 2 days   Time spent including chart review, lab review, examination, discussion with patient/Nurse/Psych : 30 min   Dixie Dials  MD  06/28/2021, 9:54 AM

## 2021-07-03 DIAGNOSIS — R002 Palpitations: Secondary | ICD-10-CM | POA: Diagnosis not present

## 2021-07-03 DIAGNOSIS — Z853 Personal history of malignant neoplasm of breast: Secondary | ICD-10-CM | POA: Diagnosis not present

## 2021-07-03 DIAGNOSIS — R0602 Shortness of breath: Secondary | ICD-10-CM | POA: Diagnosis not present

## 2021-07-03 DIAGNOSIS — I10 Essential (primary) hypertension: Secondary | ICD-10-CM | POA: Diagnosis not present

## 2021-07-11 DIAGNOSIS — I10 Essential (primary) hypertension: Secondary | ICD-10-CM | POA: Diagnosis not present

## 2021-07-11 DIAGNOSIS — R002 Palpitations: Secondary | ICD-10-CM | POA: Diagnosis not present

## 2021-07-11 DIAGNOSIS — R0602 Shortness of breath: Secondary | ICD-10-CM | POA: Diagnosis not present

## 2021-07-11 DIAGNOSIS — Z853 Personal history of malignant neoplasm of breast: Secondary | ICD-10-CM | POA: Diagnosis not present

## 2021-08-07 DIAGNOSIS — R072 Precordial pain: Secondary | ICD-10-CM | POA: Diagnosis not present

## 2021-08-07 DIAGNOSIS — I10 Essential (primary) hypertension: Secondary | ICD-10-CM | POA: Diagnosis not present

## 2021-08-07 DIAGNOSIS — Z853 Personal history of malignant neoplasm of breast: Secondary | ICD-10-CM | POA: Diagnosis not present

## 2021-08-07 DIAGNOSIS — R0602 Shortness of breath: Secondary | ICD-10-CM | POA: Diagnosis not present

## 2021-08-07 DIAGNOSIS — R002 Palpitations: Secondary | ICD-10-CM | POA: Diagnosis not present

## 2021-09-08 DIAGNOSIS — Z008 Encounter for other general examination: Secondary | ICD-10-CM | POA: Diagnosis not present

## 2021-10-07 ENCOUNTER — Other Ambulatory Visit: Payer: Self-pay | Admitting: Hematology and Oncology

## 2021-10-07 DIAGNOSIS — Z170 Estrogen receptor positive status [ER+]: Secondary | ICD-10-CM

## 2021-10-07 DIAGNOSIS — R0602 Shortness of breath: Secondary | ICD-10-CM | POA: Diagnosis not present

## 2021-10-07 DIAGNOSIS — R002 Palpitations: Secondary | ICD-10-CM | POA: Diagnosis not present

## 2021-10-07 DIAGNOSIS — I10 Essential (primary) hypertension: Secondary | ICD-10-CM | POA: Diagnosis not present

## 2021-10-07 DIAGNOSIS — Z853 Personal history of malignant neoplasm of breast: Secondary | ICD-10-CM | POA: Diagnosis not present

## 2021-11-06 DIAGNOSIS — F411 Generalized anxiety disorder: Secondary | ICD-10-CM | POA: Diagnosis not present

## 2021-11-06 DIAGNOSIS — R0602 Shortness of breath: Secondary | ICD-10-CM | POA: Diagnosis not present

## 2021-11-06 DIAGNOSIS — R072 Precordial pain: Secondary | ICD-10-CM | POA: Diagnosis not present

## 2021-11-06 DIAGNOSIS — I10 Essential (primary) hypertension: Secondary | ICD-10-CM | POA: Diagnosis not present

## 2021-11-06 DIAGNOSIS — Z853 Personal history of malignant neoplasm of breast: Secondary | ICD-10-CM | POA: Diagnosis not present

## 2022-04-15 ENCOUNTER — Telehealth: Payer: Self-pay | Admitting: Hematology and Oncology

## 2022-04-15 NOTE — Telephone Encounter (Signed)
Rescheduled appointment per room/resource. Patient is aware of the changes made to her upcoming appointment. 

## 2022-05-14 DIAGNOSIS — E7849 Other hyperlipidemia: Secondary | ICD-10-CM | POA: Diagnosis not present

## 2022-05-14 DIAGNOSIS — E876 Hypokalemia: Secondary | ICD-10-CM | POA: Diagnosis not present

## 2022-05-14 DIAGNOSIS — D649 Anemia, unspecified: Secondary | ICD-10-CM | POA: Diagnosis not present

## 2022-05-14 DIAGNOSIS — Z79899 Other long term (current) drug therapy: Secondary | ICD-10-CM | POA: Diagnosis not present

## 2022-05-18 NOTE — Progress Notes (Signed)
HEMATOLOGY-ONCOLOGY TELEPHONE VISIT PROGRESS NOTE  I connected with our patient on 05/26/22 at  2:45 PM EDT by telephone and verified that I am speaking with the correct person using two identifiers.  I discussed the limitations, risks, security and privacy concerns of performing an evaluation and management service by telephone and the availability of in person appointments.  I also discussed with the patient that there may be a patient responsible charge related to this service. The patient expressed understanding and agreed to proceed.   History of Present Illness: Grace Paul is a 87 y.o. with above-mentioned history of right breast cancer treated with neoadjuvant chemotherapy, bilateral mastectomies, and who is currently on anastrozole therapy. She presents to the clinic today for a telephone follow-up.  She is dealing with lots of anxiety and depression issues.  She has found some help with Grace Paul.  She has completed antiestrogen therapy and she will discontinue anastrozole as of now.  She was able to handle the anastrozole therapy reasonably well.  Oncology History  Breast cancer of lower-inner quadrant of right female breast ([place])  01/10/2015 Imaging   Ultrasound breast: Right breast 4:00: 2.3 cm lesion, at 4:00 6 cm from nipple to 0.4 cm hypoechoic mass, left breast 1130; 2 cm from nipple 0.4 cm mass   01/12/2015 Initial Diagnosis   Right breast biopsy: Invasive ductal carcinoma with DCIS, grade 3, ER 0%, PR 0%, HER-2 negative ratio 1.16, Ki-67 83%; left breast biopsy complex sclerosing lesion   01/19/2015 Breast MRI   Right breast 5 x 6 x 3.8 cm lesion of non-masslike and nodular enhancement: Left breast 4.5x5x3.5 cm area of non-mass enhancement adjacent to complex sclerosing lesion extending 3.5 sinus lateral to biopsy, no lymph nodes   01/30/2015 Initial Biopsy   Left Breast Biopsy: IDC with DCIS, ER 100%; PR 63%, Ki 67 13%; Her 2 Neg; Right: LOQ ER 51%, PR 45%   02/20/2015 -  03/13/2015 Neo-Adjuvant Chemotherapy   Neoadjuvant Taxol and carboplatin weekly 4, stopped early because of psychiatric illness   03/15/2015 - 03/22/2015 Hospital Admission   Severe generalized anxiety disorder, severe depression, small bitemporal and left frontal subarachnoid hemorrhage from a fall in the hospital   05/04/2015 Surgery   Left mastectomy: IDC grade 2/3; 5 cm with DCIS, 0/5 lymph nodes ER/PR Pos, Her 2 Neg;T3 N0 stage IIB; right mastectomy: DCIS low to high-grade multiple foci 5.5 cm 0/2 lymph nodes Tis N0 ER/PR Neg   07/26/2015 -  Anti-estrogen oral therapy   Anastrozole 1 mg daily 5-10 years   03/25/2016 Survivorship   SCP completed and mailed to patient     REVIEW OF SYSTEMS:   Constitutional: Denies fevers, chills or abnormal weight loss All other systems were reviewed with the patient and are negative. Observations/Objective:     Assessment Plan:  Breast cancer of lower-inner quadrant of right female breast Right breast 5 x 6 x 3.8 cm lesion of non-masslike and nodular enhancement: Invasive ductal carcinoma with DCIS, grade 3, ER 100%; PR 63%,, HER-2 negative ratio 1.16, Ki-67 83% Right Breast LOQ: 01/30/15: DCIS involving the papillary lesion ER 51%, PR 45% Left breast 4.5x5x3.5 cm area of non-mass enhancement adjacent to complex sclerosing lesion extending 3.5 cm IDC with DCIS, ER/PR Neg Ki 67 13%; Her 2 Neg; Neo-adjuvant chemotherapy with Taxol and carboplatin weekly 4 started 02/20/2015 -03/13/2015 stopped due to psychiatric illness and hospitalization. Left mastectomy 05/04/15 : IDC grade 2/3; 5 cm with IgG DCIS, 0/5 lymph nodes T3 N0 stage  IIB;   Right mastectomy: DCIS low to high-grade multiple foci 5.5 cm 0/2 lymph nodes Tis N0 ------------------------------------------------------------------------------------------------------------------------------------------------------ Current treatment: Anastrozole 1 mg daily Started 07/26/2015- Aug 2023   Major  depression, anxiety disorder: Continues to have issues with anxiety and depression. H/O Intracerebral hemorrhage: No new symptoms related to this.   Surveillance:   No role of imaging because she had bilateral mastectomies. Wants to see Grace Paul.  Recovered alcoholic Return to clinic on an as needed basis.   I discussed the assessment and treatment plan with the patient. The patient was provided an opportunity to ask questions and all were answered. The patient agreed with the plan and demonstrated an understanding of the instructions. The patient was advised to call back or seek an in-person evaluation if the symptoms worsen or if the condition fails to improve as anticipated.   I provided 12 minutes of non-face-to-face time during this encounter.  This includes time for charting and coordination of care   Grace Ohara, MD   I Grace Paul am scribing for Dr. Lindi Paul  I have reviewed the above documentation for accuracy and completeness, and I agree with the above.

## 2022-05-26 ENCOUNTER — Inpatient Hospital Stay: Payer: Medicare HMO | Attending: Hematology and Oncology | Admitting: Hematology and Oncology

## 2022-05-26 DIAGNOSIS — Z170 Estrogen receptor positive status [ER+]: Secondary | ICD-10-CM | POA: Diagnosis not present

## 2022-05-26 DIAGNOSIS — C50311 Malignant neoplasm of lower-inner quadrant of right female breast: Secondary | ICD-10-CM | POA: Diagnosis not present

## 2022-05-26 NOTE — Assessment & Plan Note (Addendum)
Right breast5 x 6 x 3.8 cm lesion of non-masslike and nodular enhancement: Invasive ductal carcinoma with DCIS, grade 3, ER 100%; PR 63%,, HER-2 negative ratio 1.16, Ki-67 83% Right Breast LOQ: 01/30/15: DCIS involving the papillary lesion ER 51%, PR 45% Left breast4.5x5x3.5 cm area of non-mass enhancement adjacent to complex sclerosing lesion extending 3.5 cm IDC with DCIS, ER/PR Neg Ki 67 13%; Her 2 Neg; Neo-adjuvant chemotherapy with Taxol and carboplatin weekly 4 started 02/20/2015 -03/13/2015 stopped due to psychiatric illness and hospitalization. Left mastectomy 05/04/15 : IDC grade 2/3; 5 cm with IgG DCIS, 0/5 lymph nodes T3 N0 stage IIB;  Right mastectomy: DCIS low to high-grade multiple foci 5.5 cm 0/2 lymph nodes Tis N0 ------------------------------------------------------------------------------------------------------------------------------------------------------ Current treatment: Anastrozole 1 mg dailyStarted 07/26/2015- Aug 2023  Major depression, anxiety disorder:Continues to have issues with anxiety and depression. H/OIntracerebral hemorrhage: No new symptoms related to this.  Surveillance: No role of imaging because she had bilateral mastectomies. Wants to see Father William Hamburger.   Return to clinic in1 yearfor follow-up with a telephone visit

## 2022-07-07 DIAGNOSIS — I10 Essential (primary) hypertension: Secondary | ICD-10-CM | POA: Diagnosis not present

## 2022-07-07 DIAGNOSIS — R072 Precordial pain: Secondary | ICD-10-CM | POA: Diagnosis not present

## 2022-07-07 DIAGNOSIS — Z853 Personal history of malignant neoplasm of breast: Secondary | ICD-10-CM | POA: Diagnosis not present

## 2022-07-07 DIAGNOSIS — F411 Generalized anxiety disorder: Secondary | ICD-10-CM | POA: Diagnosis not present

## 2022-11-26 DIAGNOSIS — I10 Essential (primary) hypertension: Secondary | ICD-10-CM | POA: Diagnosis not present

## 2022-11-26 DIAGNOSIS — F411 Generalized anxiety disorder: Secondary | ICD-10-CM | POA: Diagnosis not present

## 2022-11-26 DIAGNOSIS — Z853 Personal history of malignant neoplasm of breast: Secondary | ICD-10-CM | POA: Diagnosis not present

## 2022-11-26 DIAGNOSIS — R072 Precordial pain: Secondary | ICD-10-CM | POA: Diagnosis not present

## 2023-05-25 DIAGNOSIS — I10 Essential (primary) hypertension: Secondary | ICD-10-CM | POA: Diagnosis not present

## 2023-05-25 DIAGNOSIS — F411 Generalized anxiety disorder: Secondary | ICD-10-CM | POA: Diagnosis not present

## 2023-05-25 DIAGNOSIS — R072 Precordial pain: Secondary | ICD-10-CM | POA: Diagnosis not present

## 2023-05-25 DIAGNOSIS — N771 Vaginitis, vulvitis and vulvovaginitis in diseases classified elsewhere: Secondary | ICD-10-CM | POA: Diagnosis not present

## 2023-06-01 DIAGNOSIS — F411 Generalized anxiety disorder: Secondary | ICD-10-CM | POA: Diagnosis not present

## 2023-06-01 DIAGNOSIS — R072 Precordial pain: Secondary | ICD-10-CM | POA: Diagnosis not present

## 2023-06-01 DIAGNOSIS — N390 Urinary tract infection, site not specified: Secondary | ICD-10-CM | POA: Diagnosis not present

## 2023-06-01 DIAGNOSIS — I10 Essential (primary) hypertension: Secondary | ICD-10-CM | POA: Diagnosis not present

## 2023-11-16 DIAGNOSIS — I10 Essential (primary) hypertension: Secondary | ICD-10-CM | POA: Diagnosis not present

## 2023-11-16 DIAGNOSIS — E7849 Other hyperlipidemia: Secondary | ICD-10-CM | POA: Diagnosis not present

## 2023-11-16 DIAGNOSIS — R072 Precordial pain: Secondary | ICD-10-CM | POA: Diagnosis not present

## 2023-11-16 DIAGNOSIS — F411 Generalized anxiety disorder: Secondary | ICD-10-CM | POA: Diagnosis not present

## 2023-12-14 DIAGNOSIS — F411 Generalized anxiety disorder: Secondary | ICD-10-CM | POA: Diagnosis not present

## 2023-12-14 DIAGNOSIS — E7849 Other hyperlipidemia: Secondary | ICD-10-CM | POA: Diagnosis not present

## 2023-12-14 DIAGNOSIS — I10 Essential (primary) hypertension: Secondary | ICD-10-CM | POA: Diagnosis not present

## 2023-12-14 DIAGNOSIS — R072 Precordial pain: Secondary | ICD-10-CM | POA: Diagnosis not present

## 2024-02-09 ENCOUNTER — Other Ambulatory Visit: Payer: Self-pay

## 2024-02-09 ENCOUNTER — Emergency Department (HOSPITAL_COMMUNITY)
Admission: EM | Admit: 2024-02-09 | Discharge: 2024-02-09 | Disposition: A | Discharge status: Home / Self Care | Attending: Emergency Medicine | Admitting: Emergency Medicine

## 2024-02-09 ENCOUNTER — Emergency Department (HOSPITAL_COMMUNITY)

## 2024-02-09 ENCOUNTER — Encounter (HOSPITAL_COMMUNITY): Payer: Self-pay | Admitting: *Deleted

## 2024-02-09 DIAGNOSIS — R1084 Generalized abdominal pain: Secondary | ICD-10-CM | POA: Diagnosis not present

## 2024-02-09 DIAGNOSIS — Z79899 Other long term (current) drug therapy: Secondary | ICD-10-CM | POA: Diagnosis not present

## 2024-02-09 DIAGNOSIS — Z7982 Long term (current) use of aspirin: Secondary | ICD-10-CM | POA: Insufficient documentation

## 2024-02-09 DIAGNOSIS — Z7902 Long term (current) use of antithrombotics/antiplatelets: Secondary | ICD-10-CM | POA: Diagnosis not present

## 2024-02-09 DIAGNOSIS — K7689 Other specified diseases of liver: Secondary | ICD-10-CM | POA: Diagnosis not present

## 2024-02-09 DIAGNOSIS — K5730 Diverticulosis of large intestine without perforation or abscess without bleeding: Secondary | ICD-10-CM | POA: Diagnosis not present

## 2024-02-09 DIAGNOSIS — I10 Essential (primary) hypertension: Secondary | ICD-10-CM | POA: Diagnosis not present

## 2024-02-09 DIAGNOSIS — R109 Unspecified abdominal pain: Secondary | ICD-10-CM | POA: Diagnosis not present

## 2024-02-09 DIAGNOSIS — Z853 Personal history of malignant neoplasm of breast: Secondary | ICD-10-CM | POA: Insufficient documentation

## 2024-02-09 LAB — CBC
HCT: 39.1 % (ref 36.0–46.0)
Hemoglobin: 13.1 g/dL (ref 12.0–15.0)
MCH: 30.9 pg (ref 26.0–34.0)
MCHC: 33.5 g/dL (ref 30.0–36.0)
MCV: 92.2 fL (ref 80.0–100.0)
Platelets: 223 10*3/uL (ref 150–400)
RBC: 4.24 MIL/uL (ref 3.87–5.11)
RDW: 14 % (ref 11.5–15.5)
WBC: 7.3 10*3/uL (ref 4.0–10.5)
nRBC: 0 % (ref 0.0–0.2)

## 2024-02-09 LAB — URINALYSIS, ROUTINE W REFLEX MICROSCOPIC
Bilirubin Urine: NEGATIVE
Glucose, UA: NEGATIVE mg/dL
Hgb urine dipstick: NEGATIVE
Ketones, ur: 20 mg/dL — AB
Nitrite: NEGATIVE
Protein, ur: NEGATIVE mg/dL
Specific Gravity, Urine: 1.035 — ABNORMAL HIGH (ref 1.005–1.030)
pH: 5 (ref 5.0–8.0)

## 2024-02-09 LAB — COMPREHENSIVE METABOLIC PANEL WITH GFR
ALT: 24 U/L (ref 0–44)
AST: 26 U/L (ref 15–41)
Albumin: 4.2 g/dL (ref 3.5–5.0)
Alkaline Phosphatase: 56 U/L (ref 38–126)
Anion gap: 12 (ref 5–15)
BUN: 13 mg/dL (ref 8–23)
CO2: 20 mmol/L — ABNORMAL LOW (ref 22–32)
Calcium: 9.4 mg/dL (ref 8.9–10.3)
Chloride: 106 mmol/L (ref 98–111)
Creatinine, Ser: 1 mg/dL (ref 0.44–1.00)
GFR, Estimated: 55 mL/min — ABNORMAL LOW (ref >60)
Glucose, Bld: 103 mg/dL — ABNORMAL HIGH (ref 70–99)
Potassium: 4.4 mmol/L (ref 3.5–5.1)
Sodium: 138 mmol/L (ref 135–145)
Total Bilirubin: 0.5 mg/dL (ref 0.0–1.2)
Total Protein: 7.1 g/dL (ref 6.5–8.1)

## 2024-02-09 LAB — LIPASE, BLOOD: Lipase: 33 U/L (ref 11–51)

## 2024-02-09 MED ORDER — IOHEXOL 350 MG/ML SOLN
75.0000 mL | Freq: Once | INTRAVENOUS | Status: AC | PRN
  Administered 2024-02-09: 75 mL via INTRAVENOUS

## 2024-02-09 NOTE — ED Provider Triage Note (Signed)
 Emergency Medicine Provider Triage Evaluation Note  Grace Paul , a 89 y.o. female  was evaluated in triage.  Pt complains of abdominal pain.  Symptoms x 10 days.  Generalized.  No nausea no vomiting, does have some issues with constipation.  Was told to come emergency department by primary doctor for CT scan.  Review of Systems  Positive: Abd pain  Negative: N/V  Physical Exam  BP (!) 181/68 (BP Location: Right Arm)   Pulse 70   Temp 99.4 F (37.4 C)   Resp 18   SpO2 96%  Gen:   Awake, no distress   Resp:  Normal effort,  MSK:   Moves extremities without difficulty  Other:  Soft abd, minor diffuse tenderness  Medical Decision Making  Medically screening exam initiated at 11:53 AM.  Appropriate orders placed.  Grace Paul was informed that the remainder of the evaluation will be completed by another provider, this initial triage assessment does not replace that evaluation, and the importance of remaining in the ED until their evaluation is complete.  Generalized intermittent abdominal pain x 10 days.  Screening labs and CT scan ordered.   Grace Climes, DO 02/09/24 1154

## 2024-02-09 NOTE — ED Triage Notes (Signed)
 Pt has been having jolts of pain in her torso which she feels in her right side and into right flank and right abdomen.  Pt is concerned due to hx of breast ca

## 2024-02-09 NOTE — ED Provider Notes (Signed)
 [place] EMERGENCY DEPARTMENT AT [facility] Provider Note   CSN: [id number] Arrival date & time: 02/09/24  1028     History  Chief Complaint  Patient presents with   Pain    Grace Paul is a 89 y.o. female.  Patient with history of fibromyalgia, GERD, hypertension, hyperlipidemia presents today with complaints of abdominal pain.  She states the same began 10 days ago and has been generalized throughout.  Denies any nausea or vomiting.  No diarrhea.  Does have some issues with constipation, however she is on chronic narcotics.  She states that she is "tired of being on narcotics" and therefore she spontaneously discontinued her 15 mg of hydrocodone  a few days before pain started.  She called her primary care provider who recommended she come here for a CT scan.  She presents for same.  Denies fevers or chills.  No urinary symptoms.  No history of similar pain previously.  The history is provided by the patient. No language interpreter was used.       Home Medications Prior to Admission medications   Medication Sig Start Date End Date Taking? Authorizing Provider  acetaminophen  (TYLENOL ) 325 MG tablet Take 650 mg by mouth every 6 (six) hours as needed for moderate pain.    [provider]  amLODipine  (NORVASC ) 2.5 MG tablet Take 2.5 mg by mouth every morning.    [provider]  ascorbic acid  (VITAMIN C) 250 MG CHEW Chew 500 mg by mouth daily.    [provider]  aspirin  81 MG tablet Take 81 mg by mouth daily.    [provider]  clopidogrel  (PLAVIX ) 75 MG tablet Take 1 tablet (75 mg total) by mouth daily. 06/29/21   Pasqual Bone, MD  HYDROcodone -acetaminophen  (NORCO/VICODIN) 5-325 MG tablet Take 1 tablet by mouth every other day. Patient not taking: No sig reported 01/28/21   Cameron Cea, MD  meclizine  (ANTIVERT ) 25 MG tablet Take 1 tablet (25 mg total) by mouth 3 (three) times daily as needed. Patient taking differently: Take 25 mg  by mouth 3 (three) times daily as needed for dizziness. 06/24/15   Donn Fury, MD  neomycin -polymyxin-hydrocortisone (CORTISPORIN ) 3.5-10000-1 OTIC suspension Place 1 drop into both ears 3 (three) times daily as needed (infection prevention). 04/12/21   [provider]  nitroGLYCERIN  (NITROSTAT ) 0.4 MG SL tablet Place 1 tablet (0.4 mg total) under the tongue every 5 (five) minutes x 3 doses as needed for chest pain. 06/28/21   Pasqual Bone, MD  Propylene Glycol 0.6 % SOLN Place 1 drop into both eyes at bedtime as needed (dry eyes).    [provider]  QUEtiapine  (SEROQUEL ) 25 MG tablet Take 1 tablet (25 mg total) by mouth at bedtime. 06/28/21   Pasqual Bone, MD  rosuvastatin  (CRESTOR ) 5 MG tablet Take 1 tablet (5 mg total) by mouth daily. 06/29/21   Pasqual Bone, MD      Allergies    Chlorpromazine, Gentian violet, Halcion  [triazolam], Remeron  [mirtazapine], Trifluoperazine, Abilify  [aripiprazole ], Clarithromycin, Cleocin [clindamycin hcl], Compazine  [prochlorperazine  edisylate], Epinephrine , Hyoscyamine sulfate, and Trazodone     Review of Systems   Review of Systems  Gastrointestinal:  Positive for abdominal pain.  All other systems reviewed and are negative.   Physical Exam Updated Vital Signs BP (!) 181/68 (BP Location: Right Arm)   Pulse 70   Temp 99.4 F (37.4 C)   Resp 18   SpO2 96%  Physical Exam Vitals and nursing note reviewed.  Constitutional:  General: She is not in acute distress.    Appearance: Normal appearance. She is normal weight. She is not ill-appearing, toxic-appearing or diaphoretic.  HENT:     Head: Normocephalic and atraumatic.  Cardiovascular:     Rate and Rhythm: Normal rate.  Pulmonary:     Effort: Pulmonary effort is normal. No respiratory distress.  Abdominal:     General: Abdomen is flat.     Palpations: Abdomen is soft.     Tenderness: There is no abdominal tenderness.  Musculoskeletal:        General: Normal range of  motion.     Cervical back: Normal range of motion.  Skin:    General: Skin is warm and dry.  Neurological:     General: No focal deficit present.     Mental Status: She is alert.  Psychiatric:        Mood and Affect: Mood normal.        Behavior: Behavior normal.     ED Results / Procedures / Treatments   Labs (all labs ordered are listed, but only abnormal results are displayed) Labs Reviewed  COMPREHENSIVE METABOLIC PANEL WITH GFR - Abnormal; Notable for the following components:      Result Value   CO2 20 (*)    Glucose, Bld 103 (*)    GFR, Estimated 55 (*)    All other components within normal limits  URINALYSIS, ROUTINE W REFLEX MICROSCOPIC - Abnormal; Notable for the following components:   Specific Gravity, Urine 1.035 (*)    Ketones, ur 20 (*)    Leukocytes,Ua MODERATE (*)    Bacteria, UA RARE (*)    All other components within normal limits  CBC  LIPASE, BLOOD    EKG None  Radiology CT ABDOMEN PELVIS W CONTRAST Result Date: 02/09/2024 CLINICAL DATA:  Right abdominal pain, right flank pain. EXAM: CT ABDOMEN AND PELVIS WITH CONTRAST TECHNIQUE: Multidetector CT imaging of the abdomen and pelvis was performed using the standard protocol following bolus administration of intravenous contrast. RADIATION DOSE REDUCTION: This exam was performed according to the departmental dose-optimization program which includes automated exposure control, adjustment of the mA and/or kV according to patient size and/or use of iterative reconstruction technique. CONTRAST:  75mL OMNIPAQUE  IOHEXOL  350 MG/ML SOLN COMPARISON:  None Available. FINDINGS: Lower chest: Linear scarring or atelectasis in the lung bases. Mild cardiomegaly. Coronary artery and aortic atherosclerosis. Hepatobiliary: Simple appearing 1.7 cm cyst in the left hepatic lobe. No suspicious hepatic abnormality. Gallbladder unremarkable. Pancreas: No focal abnormality or ductal dilatation. Spleen: No focal abnormality.  Normal  size. Adrenals/Urinary Tract: No adrenal abnormality. No focal renal abnormality. No stones or hydronephrosis. Urinary bladder is unremarkable. Stomach/Bowel: Diffuse colonic diverticulosis. No active diverticulitis. Normal appendix. Stomach and small bowel decompressed. No bowel obstruction or inflammatory process. Vascular/Lymphatic: Aortic atherosclerosis. Reproductive: No visible focal abnormality. Other: No free fluid or free air. Musculoskeletal: No acute bony abnormality. IMPRESSION: Diffuse colonic diverticulosis.  No active diverticulitis. No acute findings in the abdomen or pelvis. Coronary artery disease, aortic atherosclerosis. Electronically Signed   By: Janeece Mechanic M.D.   On: 02/09/2024 15:02    Procedures Procedures    Medications Ordered in ED Medications  iohexol  (OMNIPAQUE ) 350 MG/ML injection 75 mL (75 mLs Intravenous Contrast Given 02/09/24 1413)    ED Course/ Medical Decision Making/ A&P  Medical Decision Making  This patient is a 89 y.o. female who presents to the ED for concern of abdominal pain, this involves an extensive number of treatment options, and is a complaint that carries with it a high risk of complications and morbidity. The emergent differential diagnosis prior to evaluation includes, but is not limited to,  AAA, gastroenteritis, appendicitis, Bowel obstruction, Bowel perforation. Gastroparesis, DKA, Hernia, Inflammatory bowel disease, mesenteric ischemia, pancreatitis, peritonitis SBP, volvulus.   This is not an exhaustive differential.   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   Additional history: Chart reviewed.  Physical Exam: Physical exam performed. The pertinent findings include: Well-appearing, abdomen soft and nontender.  Lab Tests: I ordered, and personally interpreted labs.  The pertinent results include: UA with ketones, noninfectious.  No acute laboratory abnormalities.   Imaging Studies: I ordered imaging studies including CT abdomen pelvis. I independently visualized and interpreted imaging which showed   Diffuse colonic diverticulosis.  No active diverticulitis.   No acute findings in the abdomen or pelvis.   Coronary artery disease, aortic atherosclerosis.  I agree with the radiologist interpretation.   Disposition: After consideration of the diagnostic results and the patients response to treatment, I feel that emergency department workup does not suggest an emergent condition requiring admission or immediate intervention beyond what has been performed at this time. The plan is: Discharge with close outpatient follow-up and return precautions.  Incidentally, the patient's doctor Dr. Sharyn Deforest just happened to be in the emergency department seeing other patients and the patient flagged him down.  He saw and evaluated the patient and felt that her pain is likely due to hydrocodone  withdrawal given her spontaneous discontinuation of this medicine.  He will see her in the office tomorrow to discuss a taper off of this medicine.  He feels she is otherwise stable for discharge from his standpoint given her negative workup.  Upon my evaluation, patient's workup is benign, her pain is gone and she is ready to go home.  Feel that Dr. Charlaine Cone is likely correct in his consumption and no further evaluation in the emergency department is  indicated.  Evaluation and diagnostic testing in the emergency department does not suggest an emergent condition requiring admission or immediate intervention beyond what has been performed at this time.  Plan for discharge with close PCP follow-up.  Patient is understanding and amenable with plan, educated on red flag symptoms that would prompt immediate return.  Patient discharged in stable condition.  Final Clinical Impression(s) / ED Diagnoses Final diagnoses:  Generalized abdominal pain    Rx / DC Orders ED Discharge Orders     None     An After Visit Summary was printed and given to the patient.     Fredna Jasper 02/09/24 2357    Almond Army, MD 02/10/24 [phone]

## 2024-02-09 NOTE — ED Notes (Signed)
 Pt left AMA. Was unable to sign form due to signature pad not working. This RN signed as a witness. MD made aware.

## 2024-02-09 NOTE — Discharge Instructions (Addendum)
 As we discussed, your workup in the ER today was reassuring for acute findings.  Laboratory evaluation and CT imaging did not reveal any emergent cause of your pain.  I do agree with your doctor who saw you in the emergency department today, your pain could very well be from withdrawing from the narcotics.  I recommend that you go to his office at your appointment tomorrow to discuss slowly tapering off this medication instead of stopping it abruptly.  Return if development of any new or worsening symptoms

## 2024-02-10 DIAGNOSIS — F411 Generalized anxiety disorder: Secondary | ICD-10-CM | POA: Diagnosis not present

## 2024-02-10 DIAGNOSIS — R072 Precordial pain: Secondary | ICD-10-CM | POA: Diagnosis not present

## 2024-02-10 DIAGNOSIS — E7849 Other hyperlipidemia: Secondary | ICD-10-CM | POA: Diagnosis not present

## 2024-02-10 DIAGNOSIS — I10 Essential (primary) hypertension: Secondary | ICD-10-CM | POA: Diagnosis not present

## 2024-03-14 DIAGNOSIS — R42 Dizziness and giddiness: Secondary | ICD-10-CM | POA: Diagnosis not present

## 2024-03-14 DIAGNOSIS — F411 Generalized anxiety disorder: Secondary | ICD-10-CM | POA: Diagnosis not present

## 2024-03-14 DIAGNOSIS — R072 Precordial pain: Secondary | ICD-10-CM | POA: Diagnosis not present

## 2024-03-14 DIAGNOSIS — I10 Essential (primary) hypertension: Secondary | ICD-10-CM | POA: Diagnosis not present

## 2024-04-11 DIAGNOSIS — F411 Generalized anxiety disorder: Secondary | ICD-10-CM | POA: Diagnosis not present

## 2024-04-11 DIAGNOSIS — R072 Precordial pain: Secondary | ICD-10-CM | POA: Diagnosis not present

## 2024-04-11 DIAGNOSIS — R42 Dizziness and giddiness: Secondary | ICD-10-CM | POA: Diagnosis not present

## 2024-04-11 DIAGNOSIS — I10 Essential (primary) hypertension: Secondary | ICD-10-CM | POA: Diagnosis not present

## 2024-05-09 DIAGNOSIS — I10 Essential (primary) hypertension: Secondary | ICD-10-CM | POA: Diagnosis not present

## 2024-05-09 DIAGNOSIS — R072 Precordial pain: Secondary | ICD-10-CM | POA: Diagnosis not present

## 2024-05-09 DIAGNOSIS — F411 Generalized anxiety disorder: Secondary | ICD-10-CM | POA: Diagnosis not present

## 2024-05-09 DIAGNOSIS — R42 Dizziness and giddiness: Secondary | ICD-10-CM | POA: Diagnosis not present

## 2024-06-15 DIAGNOSIS — R072 Precordial pain: Secondary | ICD-10-CM | POA: Diagnosis not present

## 2024-06-15 DIAGNOSIS — I425 Other restrictive cardiomyopathy: Secondary | ICD-10-CM | POA: Diagnosis not present

## 2024-06-15 DIAGNOSIS — I361 Nonrheumatic tricuspid (valve) insufficiency: Secondary | ICD-10-CM | POA: Diagnosis not present

## 2024-08-17 DIAGNOSIS — R42 Dizziness and giddiness: Secondary | ICD-10-CM | POA: Diagnosis not present

## 2024-08-17 DIAGNOSIS — I10 Essential (primary) hypertension: Secondary | ICD-10-CM | POA: Diagnosis not present

## 2024-08-17 DIAGNOSIS — R072 Precordial pain: Secondary | ICD-10-CM | POA: Diagnosis not present

## 2024-08-17 DIAGNOSIS — F411 Generalized anxiety disorder: Secondary | ICD-10-CM | POA: Diagnosis not present

## 2024-09-28 ENCOUNTER — Other Ambulatory Visit (HOSPITAL_BASED_OUTPATIENT_CLINIC_OR_DEPARTMENT_OTHER): Payer: Self-pay

## 2024-09-28 ENCOUNTER — Other Ambulatory Visit (HOSPITAL_COMMUNITY)
Admission: RE | Admit: 2024-09-28 | Discharge: 2024-09-28 | Disposition: A | Discharge status: Home / Self Care | Source: Ambulatory Visit | Attending: Certified Nurse Midwife | Admitting: Certified Nurse Midwife

## 2024-09-28 ENCOUNTER — Ambulatory Visit (HOSPITAL_BASED_OUTPATIENT_CLINIC_OR_DEPARTMENT_OTHER): Admitting: Certified Nurse Midwife

## 2024-09-28 VITALS — BP 176/51 | HR 63 | Ht 60.0 in | Wt 112.6 lb

## 2024-09-28 DIAGNOSIS — Z9010 Acquired absence of unspecified breast and nipple: Secondary | ICD-10-CM

## 2024-09-28 DIAGNOSIS — Z124 Encounter for screening for malignant neoplasm of cervix: Secondary | ICD-10-CM | POA: Insufficient documentation

## 2024-09-28 DIAGNOSIS — N852 Hypertrophy of uterus: Secondary | ICD-10-CM

## 2024-09-28 DIAGNOSIS — Z803 Family history of malignant neoplasm of breast: Secondary | ICD-10-CM

## 2024-09-28 MED ORDER — BOOSTRIX 5-2.5-18.5 LF-MCG/0.5 IM SUSY
0.5000 mL | PREFILLED_SYRINGE | Freq: Once | INTRAMUSCULAR | 0 refills | Status: AC
  Filled 2024-09-28: qty 0.5, 1d supply, fill #0

## 2024-09-28 MED ORDER — FLUZONE HIGH-DOSE 0.5 ML IM SUSY
0.5000 mL | PREFILLED_SYRINGE | Freq: Once | INTRAMUSCULAR | 0 refills | Status: AC
  Filled 2024-09-28: qty 0.5, 1d supply, fill #0
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
## 2024-09-29 ENCOUNTER — Other Ambulatory Visit (HOSPITAL_BASED_OUTPATIENT_CLINIC_OR_DEPARTMENT_OTHER): Payer: Self-pay

## 2024-09-30 LAB — CYTOLOGY - PAP
Chlamydia: NEGATIVE
Comment: NEGATIVE
Comment: NEGATIVE
Comment: NEGATIVE
Comment: NORMAL
Diagnosis: NEGATIVE
Diagnosis: REACTIVE
High risk HPV: NEGATIVE
Neisseria Gonorrhea: NEGATIVE
Trichomonas: NEGATIVE

## 2024-10-03 ENCOUNTER — Ambulatory Visit (HOSPITAL_BASED_OUTPATIENT_CLINIC_OR_DEPARTMENT_OTHER): Payer: Self-pay | Admitting: Certified Nurse Midwife

## 2024-10-03 DIAGNOSIS — N83202 Unspecified ovarian cyst, left side: Secondary | ICD-10-CM

## 2024-10-03 DIAGNOSIS — R9389 Abnormal findings on diagnostic imaging of other specified body structures: Secondary | ICD-10-CM

## 2024-10-04 ENCOUNTER — Ambulatory Visit (HOSPITAL_COMMUNITY)
Admission: RE | Admit: 2024-10-04 | Discharge: 2024-10-04 | Attending: Certified Nurse Midwife | Admitting: Certified Nurse Midwife

## 2024-10-04 DIAGNOSIS — N852 Hypertrophy of uterus: Secondary | ICD-10-CM | POA: Diagnosis not present

## 2024-10-18 DIAGNOSIS — R9389 Abnormal findings on diagnostic imaging of other specified body structures: Secondary | ICD-10-CM | POA: Insufficient documentation

## 2024-10-18 DIAGNOSIS — N83202 Unspecified ovarian cyst, left side: Secondary | ICD-10-CM | POA: Insufficient documentation
